# Patient Record
Sex: Female | Born: 1935 | Race: White | Hispanic: No | Smoking: Never smoker
Health system: Southern US, Community
[De-identification: ages and names within clinical notes are randomized; demographics above are authoritative.]

## PROBLEM LIST (undated history)

## (undated) DIAGNOSIS — H9190 Unspecified hearing loss, unspecified ear: Secondary | ICD-10-CM

## (undated) DIAGNOSIS — M1990 Unspecified osteoarthritis, unspecified site: Secondary | ICD-10-CM

## (undated) DIAGNOSIS — K219 Gastro-esophageal reflux disease without esophagitis: Secondary | ICD-10-CM

## (undated) HISTORY — PX: NO PAST SURGERIES: SHX2092

## (undated) HISTORY — PX: CATARACT EXTRACTION, BILATERAL: SHX1313

---

## 2011-07-22 ENCOUNTER — Inpatient Hospital Stay: Payer: Self-pay | Admitting: Student

## 2011-07-22 ENCOUNTER — Ambulatory Visit: Payer: Self-pay | Admitting: Internal Medicine

## 2011-07-22 LAB — URINALYSIS, COMPLETE
Bilirubin,UR: NEGATIVE
Ketone: NEGATIVE
Ketone: NEGATIVE
Nitrite: POSITIVE
Ph: 7.5 (ref 4.5–8.0)
Ph: 6 (ref 4.5–8.0)
Protein: 100
Protein: 100
RBC,UR: 11 /HPF (ref 0–5)
Specific Gravity: 1.01 (ref 1.003–1.030)
Squamous Epithelial: <1
WBC UR: 42 /HPF (ref 0–5)

## 2011-07-22 LAB — CBC
HCT: 36.6 % (ref 35.0–47.0)
MCH: 29.6 pg (ref 26.0–34.0)
MCHC: 32.8 g/dL (ref 32.0–36.0)
MCV: 90 fL (ref 80–100)
RDW: 13.1 % (ref 11.5–14.5)

## 2011-07-22 LAB — COMPREHENSIVE METABOLIC PANEL
Albumin: 3.3 g/dL — ABNORMAL LOW (ref 3.4–5.0)
Anion Gap: 9 (ref 7–16)
BUN: 13 mg/dL (ref 7–18)
Co2: 24 mmol/L (ref 21–32)
Creatinine: 1.09 mg/dL (ref 0.60–1.30)
EGFR (African American): 56 — ABNORMAL LOW
EGFR (Non-African Amer.): 49 — ABNORMAL LOW
Glucose: 168 mg/dL — ABNORMAL HIGH (ref 65–99)
SGPT (ALT): 17 U/L
Sodium: 132 mmol/L — ABNORMAL LOW (ref 136–145)

## 2011-07-22 LAB — TROPONIN I: Troponin-I: 1 ng/mL — ABNORMAL HIGH

## 2011-07-23 LAB — CK TOTAL AND CKMB (NOT AT ARMC)
CK, Total: 917 U/L — ABNORMAL HIGH (ref 21–215)
CK, Total: 861 U/L — ABNORMAL HIGH (ref 21–215)
CK-MB: 6 ng/mL — ABNORMAL HIGH (ref 0.5–3.6)
CK-MB: 6.1 ng/mL — ABNORMAL HIGH (ref 0.5–3.6)

## 2011-07-23 LAB — CBC WITH DIFFERENTIAL/PLATELET
Basophil #: 0 10*3/uL (ref 0.0–0.1)
Eosinophil #: 0 10*3/uL (ref 0.0–0.7)
Eosinophil %: 0.1 %
HCT: 36 % (ref 35.0–47.0)
Lymphocyte #: 0.7 10*3/uL — ABNORMAL LOW (ref 1.0–3.6)
MCH: 29.6 pg (ref 26.0–34.0)
MCHC: 32.8 g/dL (ref 32.0–36.0)
MCV: 90 fL (ref 80–100)
Monocyte #: 1.1 x10 3/mm — ABNORMAL HIGH (ref 0.2–0.9)
Monocyte %: 8.2 %
Neutrophil #: 11.4 10*3/uL — ABNORMAL HIGH (ref 1.4–6.5)
Neutrophil %: 86.7 %
RBC: 3.99 10*6/uL (ref 3.80–5.20)
RDW: 13.1 % (ref 11.5–14.5)

## 2011-07-23 LAB — PROTIME-INR
INR: 1.2
Prothrombin Time: 15.9 secs — ABNORMAL HIGH (ref 11.5–14.7)

## 2011-07-23 LAB — LIPID PANEL
HDL Cholesterol: 40 mg/dL (ref 40–60)
Triglycerides: 56 mg/dL (ref 0–200)
VLDL Cholesterol, Calc: 11 mg/dL (ref 5–40)

## 2011-07-23 LAB — BASIC METABOLIC PANEL
BUN: 12 mg/dL (ref 7–18)
Calcium, Total: 8.2 mg/dL — ABNORMAL LOW (ref 8.5–10.1)
Co2: 25 mmol/L (ref 21–32)
Creatinine: 1.03 mg/dL (ref 0.60–1.30)
EGFR (African American): >60
EGFR (Non-African Amer.): 52 — ABNORMAL LOW
Osmolality: 265 (ref 275–301)
Potassium: 3.5 mmol/L (ref 3.5–5.1)
Sodium: 132 mmol/L — ABNORMAL LOW (ref 136–145)

## 2011-07-23 LAB — URINE CULTURE

## 2011-07-23 LAB — TROPONIN I: Troponin-I: 1.25 ng/mL — ABNORMAL HIGH

## 2011-07-24 LAB — CBC WITH DIFFERENTIAL/PLATELET
Basophil #: 0 10*3/uL (ref 0.0–0.1)
Basophil %: 0.1 %
Eosinophil #: 0 10*3/uL (ref 0.0–0.7)
Eosinophil %: 0.2 %
HGB: 12 g/dL (ref 12.0–16.0)
MCH: 30.2 pg (ref 26.0–34.0)
MCHC: 33.2 g/dL (ref 32.0–36.0)
MCV: 91 fL (ref 80–100)
Monocyte #: 1.1 x10 3/mm — ABNORMAL HIGH (ref 0.2–0.9)
Monocyte %: 8.3 %
Neutrophil #: 11.3 10*3/uL — ABNORMAL HIGH (ref 1.4–6.5)
Neutrophil %: 82.7 %
Platelet: 156 10*3/uL (ref 150–440)
RDW: 13.5 % (ref 11.5–14.5)

## 2011-07-24 LAB — URINE CULTURE

## 2011-07-25 LAB — CBC WITH DIFFERENTIAL/PLATELET
Basophil %: 0.3 %
Eosinophil #: 0.2 10*3/uL (ref 0.0–0.7)
HGB: 11.3 g/dL — ABNORMAL LOW (ref 12.0–16.0)
Lymphocyte %: 21.1 %
MCH: 30.1 pg (ref 26.0–34.0)
MCHC: 33 g/dL (ref 32.0–36.0)
Monocyte %: 13 %
Neutrophil %: 63.6 %
Platelet: 148 10*3/uL — ABNORMAL LOW (ref 150–440)
RBC: 3.76 10*6/uL — ABNORMAL LOW (ref 3.80–5.20)
RDW: 13.4 % (ref 11.5–14.5)
WBC: 8.4 10*3/uL (ref 3.6–11.0)

## 2011-07-27 LAB — CULTURE, BLOOD (SINGLE)

## 2011-07-29 LAB — CULTURE, BLOOD (SINGLE)

## 2013-04-12 ENCOUNTER — Ambulatory Visit: Payer: Self-pay | Admitting: Family Medicine

## 2013-04-12 LAB — URINALYSIS, COMPLETE
Bilirubin,UR: NEGATIVE
Glucose,UR: NEGATIVE mg/dL (ref 0–75)
Ketone: NEGATIVE
Nitrite: NEGATIVE
Ph: 7.5 (ref 4.5–8.0)
RBC,UR: >30 /HPF (ref 0–5)
Specific Gravity: 1.01 (ref 1.003–1.030)

## 2013-04-12 LAB — CBC WITH DIFFERENTIAL/PLATELET
BASOS ABS: 0.1 10*3/uL (ref 0.0–0.1)
BASOS PCT: 0.4 %
EOS PCT: 0 %
Eosinophil #: 0 10*3/uL (ref 0.0–0.7)
HCT: 41.2 % (ref 35.0–47.0)
HGB: 13.5 g/dL (ref 12.0–16.0)
LYMPHS ABS: 0.9 10*3/uL — AB (ref 1.0–3.6)
Lymphocyte %: 6 %
MCH: 29.3 pg (ref 26.0–34.0)
MCHC: 32.7 g/dL (ref 32.0–36.0)
MCV: 90 fL (ref 80–100)
MONO ABS: 1.7 x10 3/mm — AB (ref 0.2–0.9)
Monocyte %: 11.4 %
Neutrophil #: 12.3 10*3/uL — ABNORMAL HIGH (ref 1.4–6.5)
Neutrophil %: 82.2 %
Platelet: 214 10*3/uL (ref 150–440)
RBC: 4.59 10*6/uL (ref 3.80–5.20)
RDW: 13.2 % (ref 11.5–14.5)
WBC: 15 10*3/uL — ABNORMAL HIGH (ref 3.6–11.0)

## 2013-04-12 LAB — BASIC METABOLIC PANEL
Anion Gap: 9 (ref 7–16)
BUN: 10 mg/dL (ref 7–18)
CALCIUM: 8.5 mg/dL (ref 8.5–10.1)
CHLORIDE: 97 mmol/L — AB (ref 98–107)
Co2: 27 mmol/L (ref 21–32)
Creatinine: 0.86 mg/dL (ref 0.60–1.30)
Glucose: 137 mg/dL — ABNORMAL HIGH (ref 65–99)
Osmolality: 268 (ref 275–301)
Potassium: 3.9 mmol/L (ref 3.5–5.1)
SODIUM: 133 mmol/L — AB (ref 136–145)

## 2013-04-12 LAB — RAPID STREP-A WITH REFLX: MICRO TEXT REPORT: NEGATIVE

## 2013-04-12 LAB — RAPID INFLUENZA A&B ANTIGENS

## 2013-04-14 LAB — URINE CULTURE

## 2013-04-15 LAB — BETA STREP CULTURE(ARMC)

## 2013-08-10 IMAGING — CT CT HEAD WITHOUT CONTRAST
2 series · 16 of 30 positions shown, 20 images · non-contrast
Comparison: none

RESULT:      No hemorrhage. Prominent sulci noted consistent with atrophy.
No mass. No acute bony abnormality.

[Series 2: without · axial · non-contrast · 0.41mm/px · z∈[-132,-12]mm · 13 of 30 slices shown, 17 images]
[im 3/30  brain]
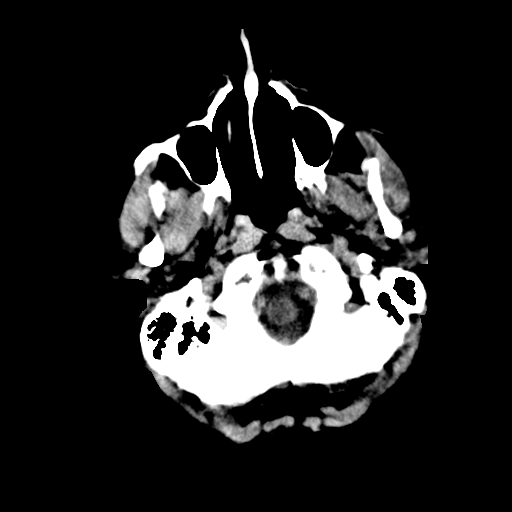
[im 3/30  bone]
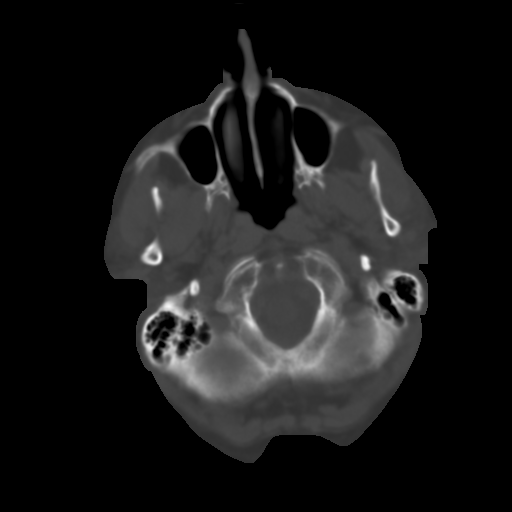
[im 5/30  brain]
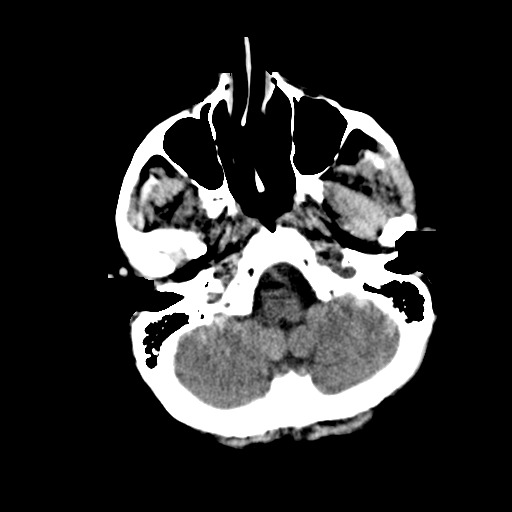
[im 7/30  brain]
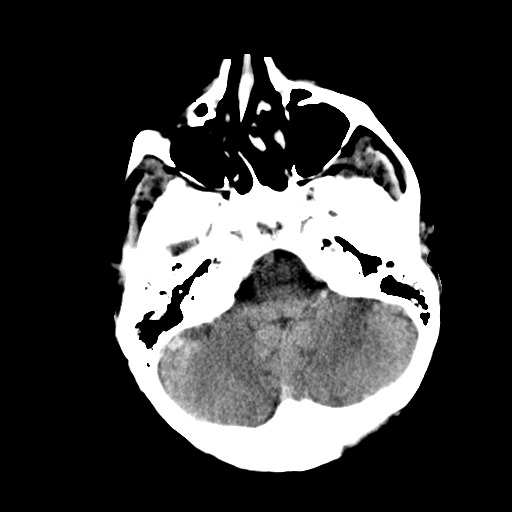
[im 9/30  brain]
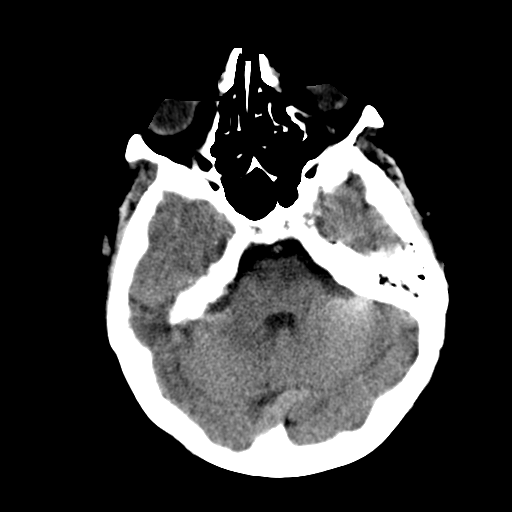
[im 11/30  brain]
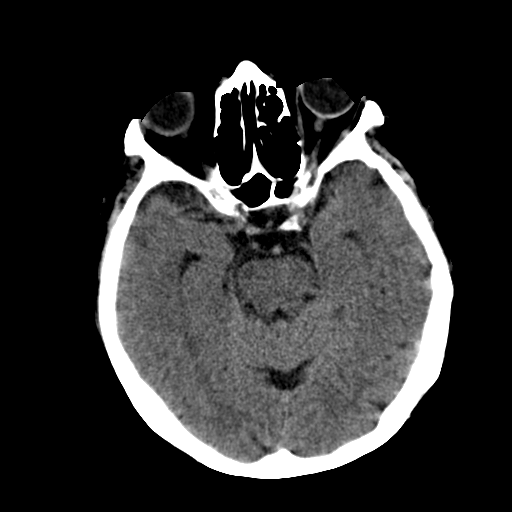
[im 11/30  bone]
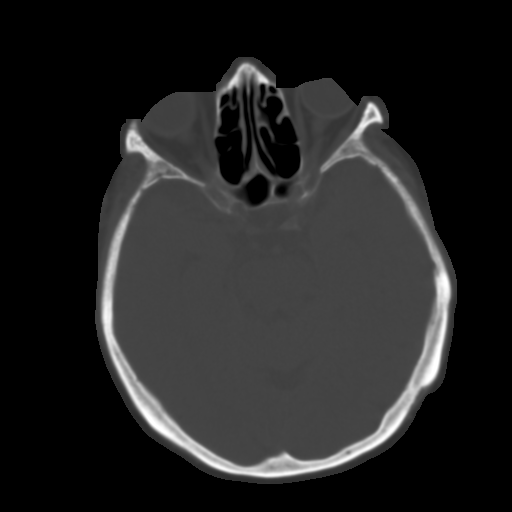
[im 13/30  brain]
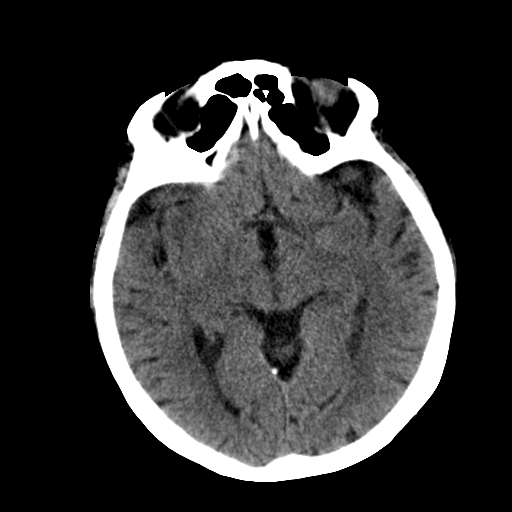
[im 15/30  brain]
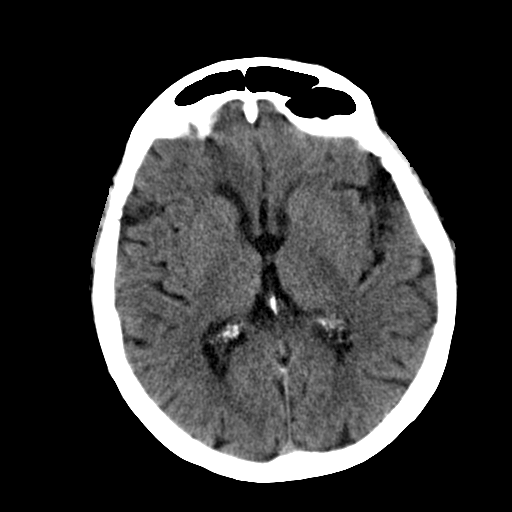
[im 17/30  brain]
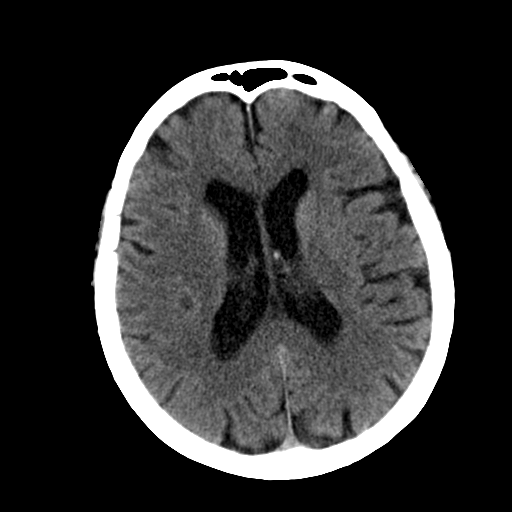
[im 19/30  brain]
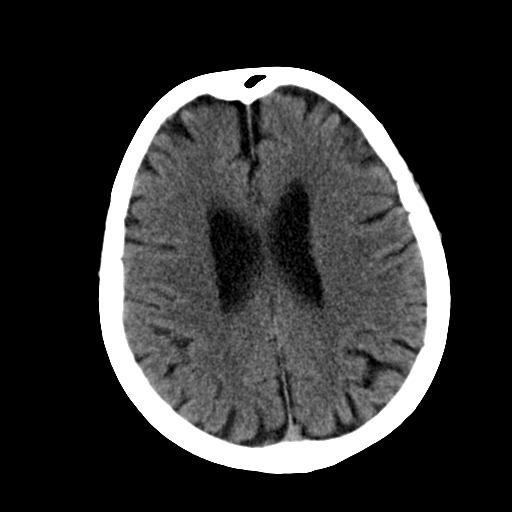
[im 19/30  bone]
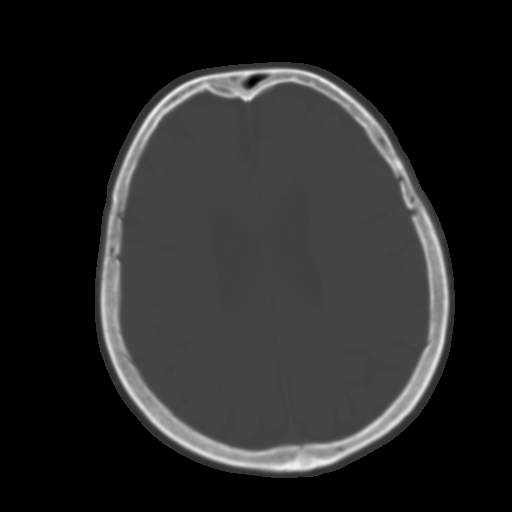
[im 21/30  brain]
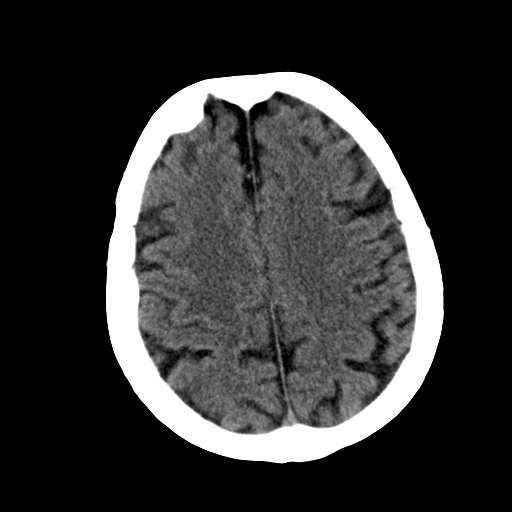
[im 23/30  brain]
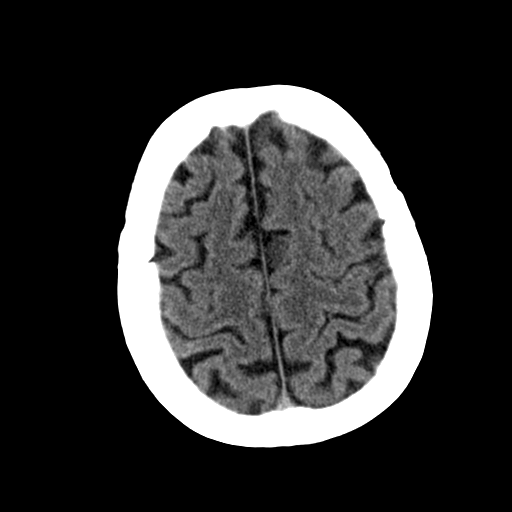
[im 25/30  brain]
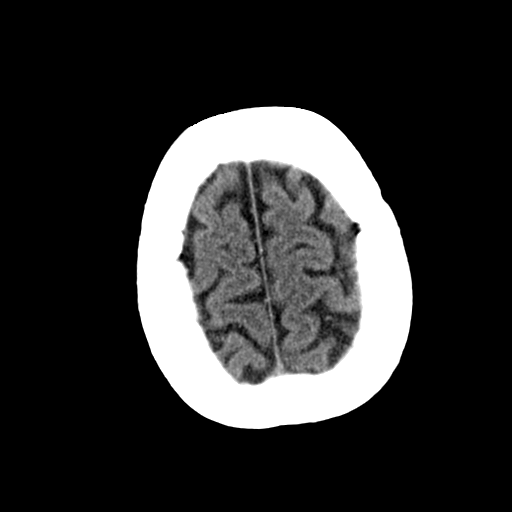
[im 27/30  brain]
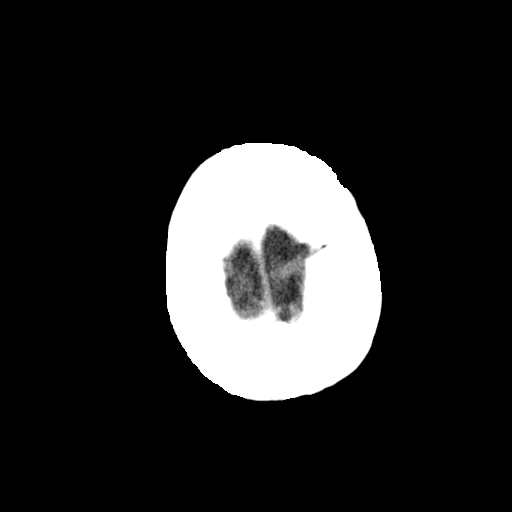
[im 27/30  bone]
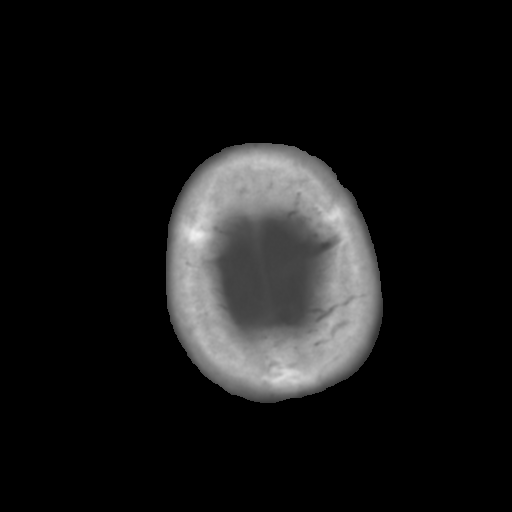

[Series 3: bone · axial · 0.41mm/px · z∈[-132,-92]mm · 3 of 30 slices shown]
[im 3/30  bone]
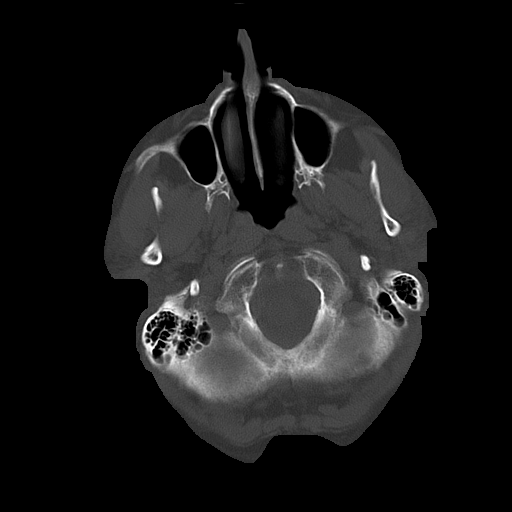
[im 7/30  bone]
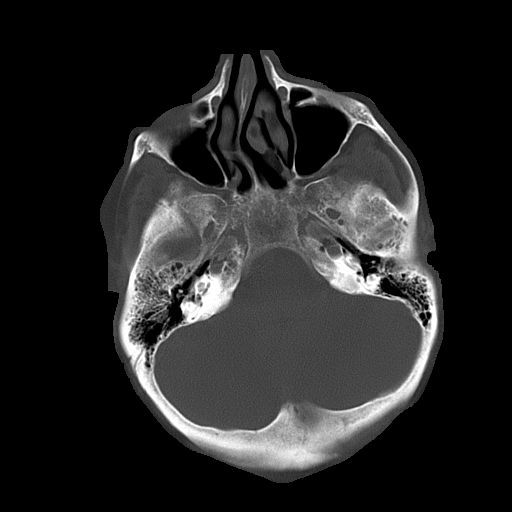
[im 11/30  bone]
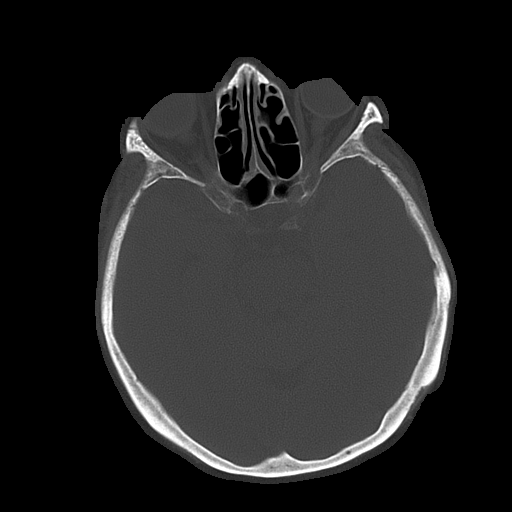

[16 of 30 positions shown; findings below may reference images not displayed]

IMPRESSION: No acute abnormality.

## 2013-08-10 IMAGING — CR DG CHEST 2V
1 series · 2 of 2 positions shown · non-contrast
Comparison: none

REASON FOR EXAM: fever
COMMENTS:

PROCEDURE:     DXR - DXR CHEST PA (OR AP) AND LATERAL  - July 22, 2011  [DATE]
RESULT:     The lungs are clear. The heart and pulmonary vessels are normal.
The bony and mediastinal structures are unremarkable. There is no effusion.
There is no pneumothorax or evidence of congestive failure.

[Series 1: w chest pa · 0.14mm/px · 2 of 2 slices shown]
[im 1/2]
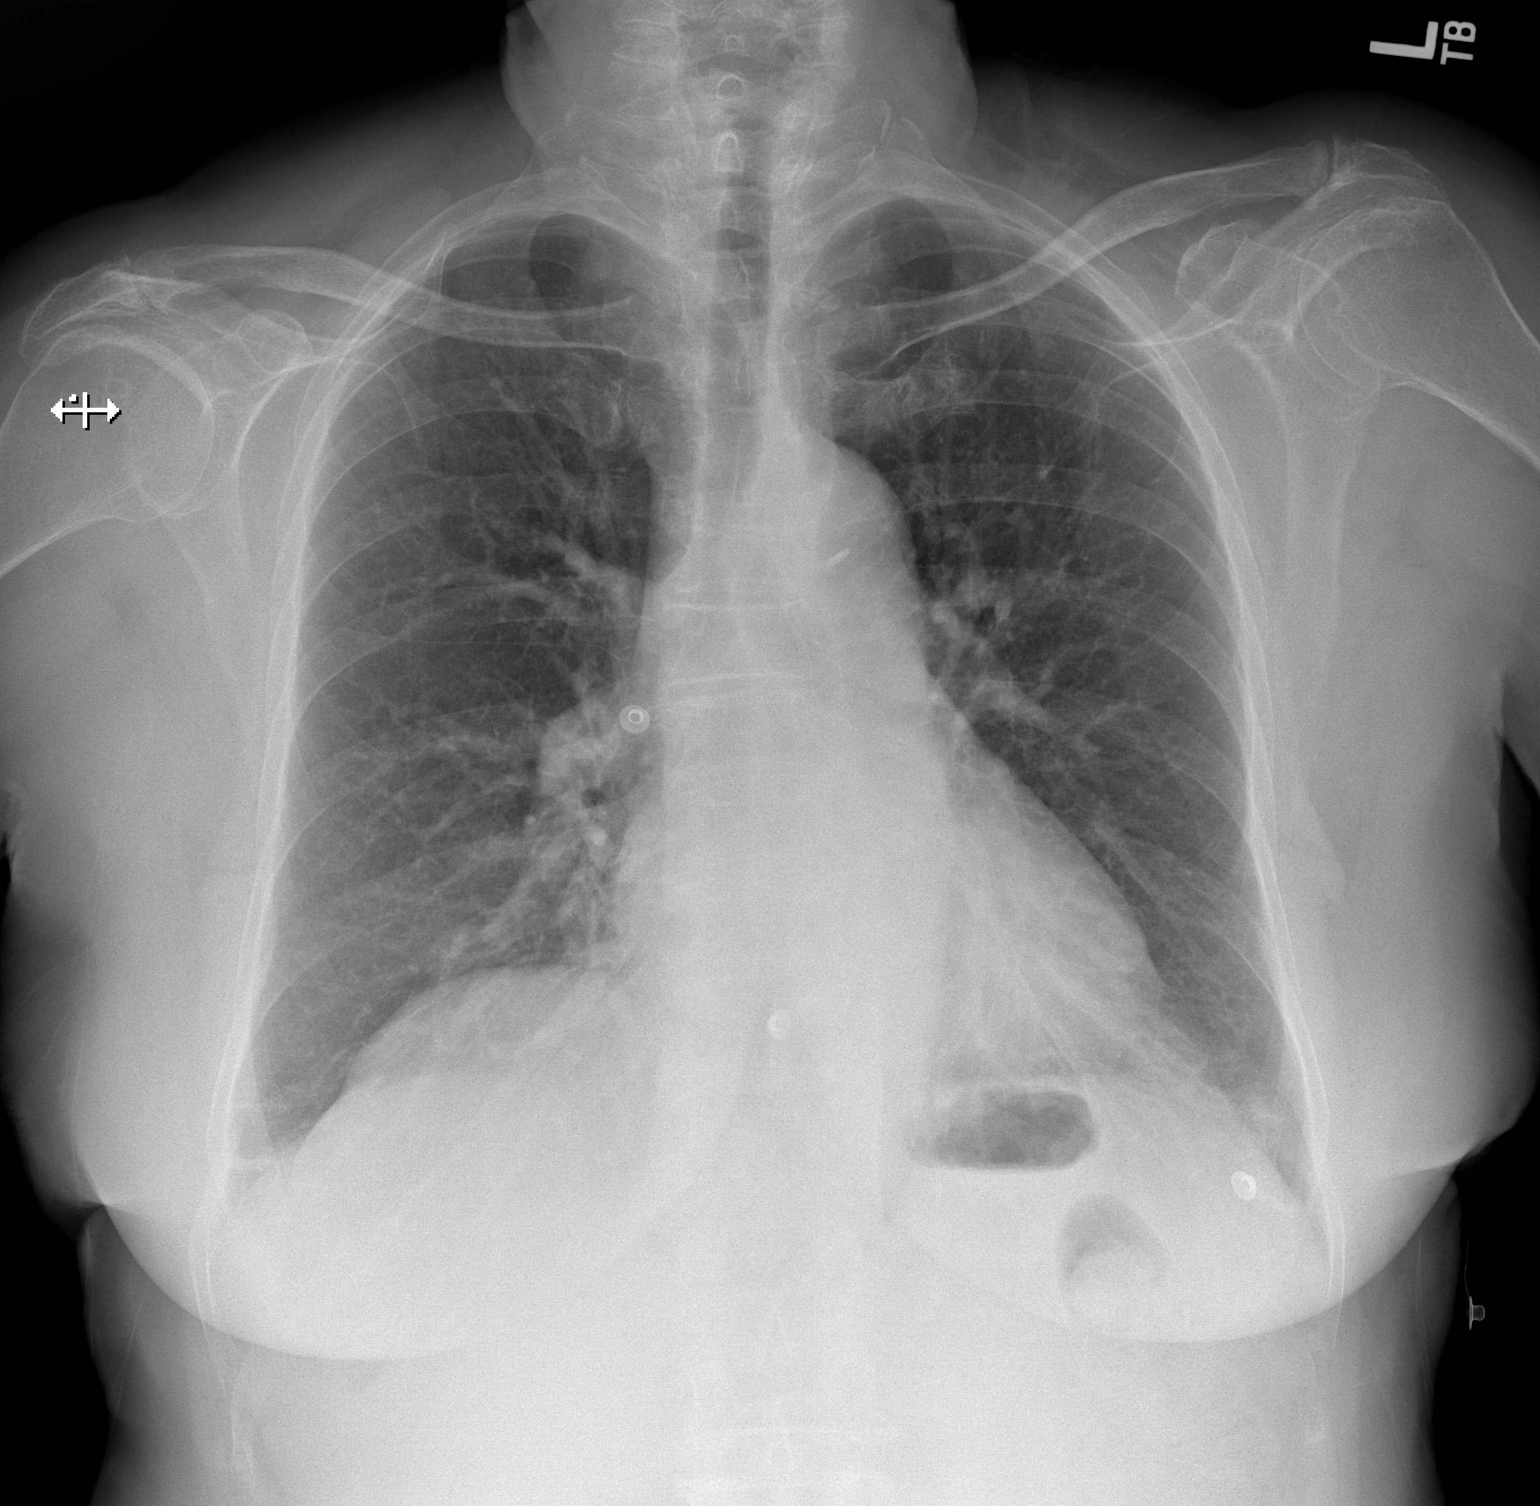
[im 2/2]
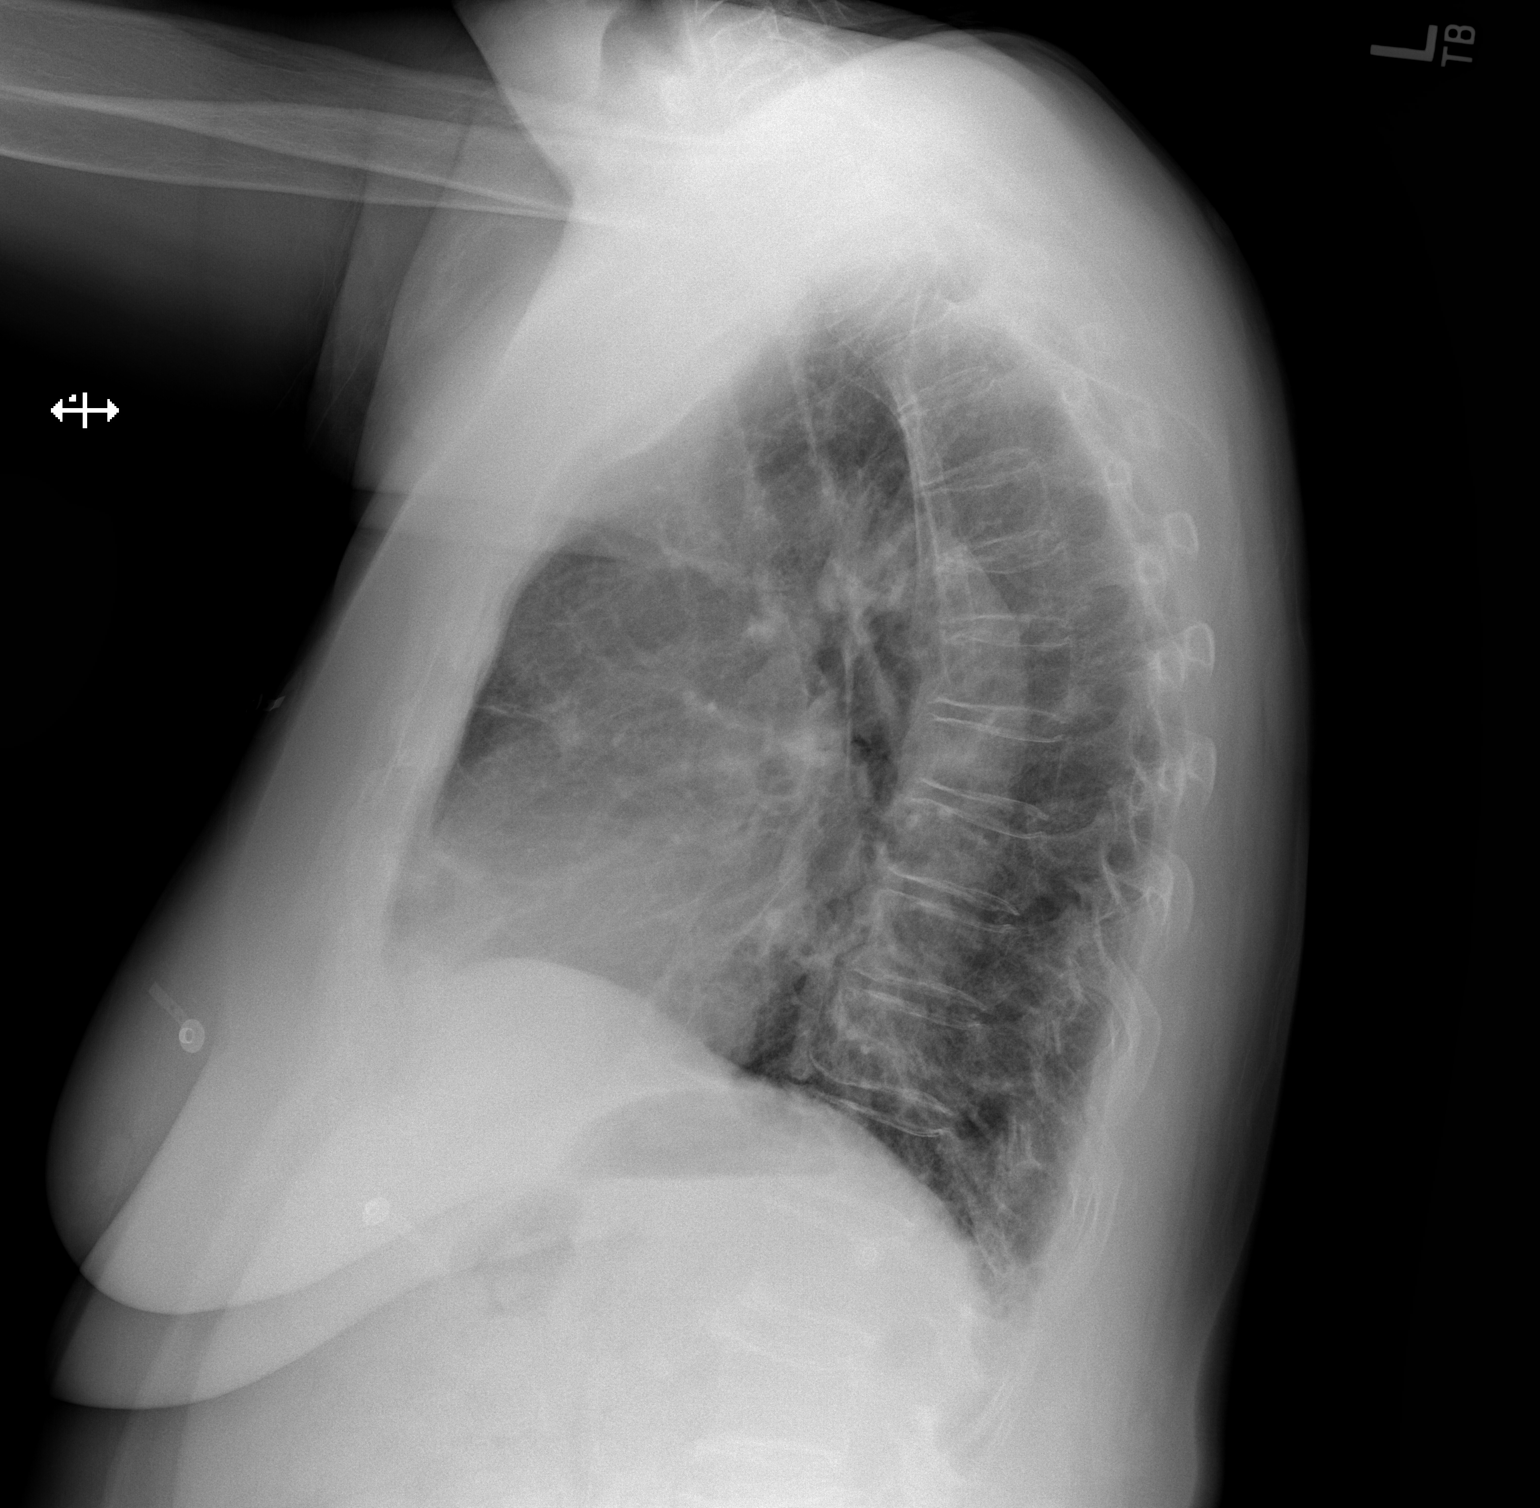

[2 of 2 positions shown; findings below may reference images not displayed]

IMPRESSION: No acute cardiopulmonary disease.

[REDACTED]

## 2013-11-13 ENCOUNTER — Ambulatory Visit: Payer: Self-pay

## 2014-05-21 NOTE — H&P (Signed)
PATIENT NAME:  Becky Shea, Becky Shea MR#:  [id number] DATE OF BIRTH:  Aug 19, 1931  DATE OF ADMISSION:  07/22/2011  PRIMARY CARE PHYSICIAN: None.  ER PHYSICIAN: Dr. Cinda Quest ADMITTING PHYSICIAN: Dr. Pearletha Furl  PRESENTING COMPLAINT: Fever and loss of consciousness.   HISTORY: Patient is a 81 year old lady who was in her usual state of health until last week when she began having recurrent lower abdominal pain, dysuria, frequency. Was seen by physician at urgent care center this morning and diagnosed with urinary tract infection and started on Bactrim. Went home, took the Bactrim and soon afterwards developed some rigors, chills. Later this evening patient passed out. Does not recall the event preceding the passing out but denies any prior palpitations, chest pain, PND, orthopnea or pedal edema. No episodes of vomiting prior to the passing out. She was found on the floor by her granddaughter who activated EMS and she was brought to the Emergency Room. In route to the ER patient vomited twice, was nonbloody. Denies any abdominal pain. No history of headache, rashes, trauma, ENT bleed. For this she was seen in the Emergency Room and referred to the hospitalist. Work-up so far showed elevated temperature of 100.9, was 101.5 at home prior to this episode. Heart rate was 113, blood pressure 121/69. CBC showed elevated white count of 18, sodium 132, creatinine 1, glucose 158. Troponin is elevated at 1. Urinalysis was positive for urinary tract infection.   REVIEW OF SYSTEMS: CONSTITUTIONAL: Positive for fever, fatigue, weakness. No weight loss or weight gain. EYES: No blurred vision, redness or discharge. ENT: No discharge, redness, difficulty swallowing or pain in the ears. RESPIRATORY: Denies any cough, shortness of breath. CARDIOVASCULAR: Denies any chest pain, orthopnea, pedal edema. No arrhythmia, exertional dyspnea but positive for syncopal episode today which was not witnessed. GASTROINTESTINAL: Has some nausea and  vomiting today. Vomited twice earlier in the day. Denies any diarrhea. No abdominal pain. No change in bowel habits. GENITOURINARY: Positive for dysuria, frequency but no hematuria. ENDOCRINE: No polyuria, polydipsia, heat or cold intolerance. HEMATOLOGIC: No anemia, easy bruising, bleeding, or swollen glands. SKIN: No rashes, change in hair or skin texture. MUSCULOSKELETAL: Positive for generalized joint aches. No redness or limited activity of joints. No swelling. NEUROLOGIC: No numbness, weakness, vertigo, CVA, seizure but has some memory impairment about events preceding the syncopal episode. PSYCH: No anxiety, depression, or nervousness.   PAST MEDICAL HISTORY: Nil not note.  PAST SURGICAL HISTORY: Nil of note. Had prior history of lipoma excision on her back more than 30 years ago.   SOCIAL HISTORY: Lives at home with family. Occasional social alcohol use. Denies any tobacco or recreational drug use. Used to work as a Museum/gallery conservator.   FAMILY HISTORY: Denies any family history of coronary artery disease, hypertension, diabetes, or CVA.   ALLERGIES: No known drug allergies.   MEDICATIONS:  1. Multivitamin 1 tablet daily.  2. Vitamin B12 1 tablet daily.  3. Bactrim DS which was started today which she was only taking 1 tablet.   PHYSICAL EXAMINATION:  VITAL SIGNS: Temperature 100.9, pulse 113 on arrival, now is 98, respiratory rate 20, blood pressure 121/69 on arrival, now is 97/58, oxygen saturation 89 on room air, now 92 on 2 liters nasal cannula.   GENERAL: Elderly obese lady lying on the gurney, awake, alert, oriented to time, place, and person, in no overt distress. Family at bedside, supportive.   HEENT: Atraumatic, normocephalic. Pupils equal, reactive to light and accommodation. Extraocular movements intact. Mucous membranes pink, dry.  NECK: Supple. No JV distention.   CHEST: Good air entry. Clear to auscultation.   HEART: Regular rate, rhythm. No murmurs.   ABDOMEN:  Full, moves with respiration, nontender. Bowel sounds normoactive. No organomegaly.   EXTREMITIES: No edema, clubbing, deformity.   NEUROLOGICAL: No focal motor or sensory deficits.   PSYCH: Affect appropriate to situation.   SKIN: Lipoma on the midportion of the back, which is hard,  subcutaneous tissue.   LABORATORY, DIAGNOSTIC, AND RADIOLOGICAL DATA: EKG shows sinus rhythm, rate of 93 with multiple PVCs. No acute ST wave changes. CT head showed no acute intracranial abnormality. CBC: White count 18. No previous labs for comparison. Hemoglobin 12, platelets 197. Chemistry: Sodium 132, potassium 3.7, bicarbonate 24, creatinine 1, BUN 13, glucose 158, calcium 8.2. Normal LFT. Troponin is elevated at 1. Urinalysis showed positive nitrites, positive leukocyte esterase 3+, red blood cells 15 to 30, white cells too numerous to count, bacteria 4+.   IMPRESSION:  1. Sepsis in view of elevated temperature, heart rate, respiratory rate, and signs of infection, likely secondary to the urinary tract infection. 2. Urinary tract infection. 3. Syncopal episodes, not otherwise specified with differential including arrhythmia versus orthostatic,likely vasovagal from vomiting, rule out cardiogenic.  4. Elevated troponin likely from the sepsis syndrome.  5. Hyponatremia, not otherwise specified.  6. Hyperglycemia, not otherwise specified.   PLAN:  1. Admit to general medical floor for serial cardiac enzymes. Check TSH, magnesium, fasting lipid profile, hemoglobin A1c. For telemonitoring. Echocardiogram in a.m. IV antibiotics with Rocephin. IV fluid for rehydration. Check blood culture, urine culture. Put patient on low-dose aspirin. Lovenox for anticoagulation, p.r.n. nitroglycerin and morphine.  2. GI prophylaxis with Protonix.  3. Deep vein thrombosis with Lovenox.  4. CODE STATUS: FULL CODE.       TOTAL PATIENT CARE TIME: 50 minutes.   ____________________________ Jules Husbands Pearletha Furl,  MD mia:cms D: 07/22/2011 18:57:57 ET T: 07/23/2011 06:33:23 ET JOB#: [id number]  cc: Zain Bingman I. Pearletha Furl, MD, <Dictator>  Carola Frost MD ELECTRONICALLY SIGNED 07/24/2011 3:32

## 2014-05-21 NOTE — Consult Note (Signed)
General Aspect 81 year old female with sepsis from UTI, syncope with positive troponin.  Her initial troponin was 0.96, followed by 1.25 now trending down at 1.0.  Patient is very healthy.  She has no known cardiac history or other illnesses.  She is on no medications.  She developed symptoms of UTI and took one dose of Septra.  She started feeling nauseated and lightheaded.  The next thing she knew she would was in the floor.  She could sit up, but could not get up due to weakness.  It isn't known how long she was in the floor.  Her husband was at  home but was unaware she was having problems in the bedroom.  The granddaughter came in around lunch time was able to get her off the floor and called 911.  Initial  EKG did not show any acute changes.  Telemetry has not shown any arrhythmias.  Patient has history of syncope when she was around 16 or 17.  This is the first syncopal episode of her adult life  and is felt secondary to her acute illness.  She is feeling much better today.  She is anxious to go home.  She denies any recent chest pain or shortness of breath, pedal edema, palpitations, PND, orthopnea. Echocardiogram showed normal LV function with some mild valvular insufficiency.   Physical Exam:   GEN well developed, no acute distress    HEENT pink conjunctivae    RESP normal resp effort  clear BS    CARD Regular rate and rhythm  Normal, S1, S2  No murmur    ABD normal BS    EXTR negative edema    SKIN normal to palpation    NEURO cranial nerves intact, follows commands, motor/sensory function intact    PSYCH alert, A+O to time, place, person, good insight   Review of Systems:   Subjective/Chief Complaint Fever, weakness, syncope    General: Fever/chills  Weakness  resolving    Cardiovascular: No Complaints    Genitourinary: Frequent urination  Burning or painful urination    Neurologic: syncope    Review of Systems: All other systems were reviewed and found to be negative     Medications/Allergies Reviewed Medications/Allergies reviewed   Lab Results: Routine Chem:  25-Jun-13 16:04    Result Comment TROPONIN - RESULTS VERIFIED BY REPEAT TESTING.  - C/TYLER WILLIAMS @ 1744 ON 07/22/2011 [facility]  - READ-BACK PROCESS PERFORMED.  Result(s) reported on 22 Jul 2011 at 05:45PM.  26-Jun-13 00:54    Result Comment troponin - RESULTS VERIFIED BY REPEAT TESTING.  - prev c/ @ 1744 07/22/11 mpg  Result(s) reported on 23 Jul 2011 at 01:56AM.    04:21    Glucose, Serum  114   BUN 12   Creatinine (comp) 1.03   Potassium, Serum 3.5   Chloride, Serum 98   CO2, Serum 25   Calcium (Total), Serum  8.2   Anion Gap 9   Osmolality (calc) 265   eGFR (African American) >60   eGFR (Non-African American)  52 (eGFR values <8m/min/1.73 m2 may be an indication of chronic kidney disease (CKD). Calculated eGFR is useful in patients with stable renal function. The eGFR calculation will not be reliable in acutely ill patients when serum creatinine is changing rapidly. It is not useful in  patients on dialysis. The eGFR calculation may not be applicable to patients at the low and high extremes of body sizes, pregnant women, and vegetarians.)   Magnesium, Serum  1.7 (1.8-2.4 THERAPEUTIC RANGE: 4-7 mg/dL TOXIC: > 10 mg/dL  -----------------------)   Hemoglobin A1c ([facility]) 5.7 (The American Diabetes Association recommends that a primary goal of therapy should be <7% and that physicians should reevaluate the treatment regimen in patients with HbA1c values consistently >8%.)   Cholesterol, Serum 140   Triglycerides, Serum 56   HDL (INHOUSE) 40   VLDL Cholesterol Calculated 11   LDL Cholesterol Calculated 89 (Result(s) reported on 23 Jul 2011 at 06:19AM.)    07:54    Result Comment TROPONIN - RESULTS VERIFIED BY REPEAT TESTING.  - RESULT PREVIOUSLY CALLED 07/22/11  - AT 1744 BY [facility]  Result(s) reported on 23 Jul 2011 at 09:24AM.  Routine Coag:  26-Jun-13 04:21    Prothrombin   15.9   INR 1.2 (INR reference interval applies to patients on anticoagulant therapy. A single INR therapeutic range for coumarins is not optimal for all indications; however, the suggested range for most indications is 2.0 - 3.0. Exceptions to the INR Reference Range may include: Prosthetic heart valves, acute myocardial infarction, prevention of myocardial infarction, and combinations of aspirin and anticoagulant. The need for a higher or lower target INR must be assessed individually. Reference: The Pharmacology and Management of the Vitamin K  antagonists: the seventh ACCP Conference on Antithrombotic and Thrombolytic Therapy. IHKVQ.2595 Sept:126 (3suppl): [id number]. A HCT value >55% may artifactually increase the PT.  In one study,  the increase was an average of 25%. Reference:  "Effect on Routine and Special Coagulation Testing Values of Citrate Anticoagulant Adjustment in Patients with High HCT Values." American Journal of Clinical Pathology 2006;126:400-405.)  Routine Hem:  26-Jun-13 04:21    WBC (CBC)  13.2   RBC (CBC) 3.99   Hemoglobin (CBC)  11.8   Hematocrit (CBC) 36.0   Platelet Count (CBC) 173   MCV 90   MCH 29.6   MCHC 32.8   RDW 13.1   Neutrophil % 86.7   Lymphocyte % 4.9   Monocyte % 8.2   Eosinophil % 0.1   Basophil % 0.1   Neutrophil #  11.4   Lymphocyte #  0.7   Monocyte #  1.1   Eosinophil # 0.0   Basophil # 0.0 (Result(s) reported on 23 Jul 2011 at [facility].)   Radiology Results: XRay:    26-Jun-13 10:38, Chest PA and Lateral   Chest PA and Lateral    REASON FOR EXAM:    POSSIBLE ASPIRATION  COMMENTS:       PROCEDURE: DXR - DXR CHEST PA (OR AP) AND LATERAL  - Jul 23 2011 10:38AM     RESULT: Comparison is made to the study of 22 July 2011.    The lungs are hyperinflated consistent with COPD. The cardiac silhouette   is enlarged. Cardiac monitoring electrodes are present. The interstitial   markings are prominent which could be secondary to  chronic inflammation   and fibrosis or possibly edema. Pneumonitis is a consideration but this   appears to be unchanged.    IMPRESSION:   1. Hyperinflation consistent with COPD.  2. Mild cardiomegaly.  3. Interstitial prominence is likely secondary to chronic inflammation   and fibrosis. Edema is felt to be less likely.    Dictation Site: 2          Verified By: Sundra Aland, M.D., MD  Cardiology:    26-Jun-13 08:54, Echo Doppler   Echo Doppler    Interpretation Summary    Left ventricular systolic function is normal.ef  55% The left atrium   is mildly dilated. The right atrium is mildly dilated. There is mild   to moderate mitral regurgitation. There is mild to moderate   tricuspid regurgitation.    Procedure:    A two-dimensional transthoracic echocardiogram with color flow and   Doppler was performed.    Left Ventricle    The left ventricle is normal in size.    Left ventricular systolic function is normal.    Right Ventricle    The right ventricle is normal in size and function.    Atria    The left atrium is mildly dilated.    The right atrium is mildly dilated.    Mitral Valve    The mitral valve leaflets appear thickened, but open well.    There is mild to moderate mitral regurgitation.    Tricuspid Valve    The tricuspid valve is normal in structure and function.    There is mild to moderate tricuspid regurgitation.    Aortic Valve    The aortic valve is normal in structure and function.    No aortic regurgitation is present.    Pulmonic Valve    The pulmonic valve is not well seen, but is grossly normal.    Great Vessels    The aortic root is normal size.    Pericardium/Pleural    No pericardial effusion.    MMode 2D Measurements and Calculations    RVDd: 3.7 cm    IVSd: 1.2 cm    LVIDd: 4.0 cm    LVIDs: 2.9 cm    LVPWd: 1.2 cm    FS: 28 %    EF(Teich): 55 %    Ao root diam: 3.1 cm    LA dimension: 3.0 cm    LVOT diam: 2.1  cm    Doppler Measurements and Calculations    MV E point: 72 cm/sec    MV A point: 99 cm/sec    MV E/A: 0.73     MV dec time: 0.26 sec    Ao V2 max: 153 cm/sec    Ao max PG: 9.0 mmHg    AVA(V,D): 2.3 cm2    LV max PG: 4.0 mmHg    LV V1 max: 101 cm/sec    PA V2 max: 108 cm/sec    PA max PG: 5.0 mmHg    TR Max vel: 254 cm/sec    TR Max PG: 58mHg    RVSP: 31 mmHg    RAP systole: 5.0 mmHg    Reading Physician: KSerafina Royals  Sonographer: HSherrie Sport Interpreting Physician:  BSerafina Royals  electronically signed on   07-23-2011 13:34:55  Requesting Physician: KSerafina Royals   No Known Allergies:     Impression 81year old female with no significant past medical concerns, coronary disease and no previous medications with syncope and collapse associated with UTI and sepsis, elevation of troponin, subendocardial infarction, though to be secondary to sepsis, hypotension, tachycardia and  subsequent stress on the myocardium but not an acute coronary syndrome.    Plan 1. Discussed the patient with Dr. KNehemiah Massedis in agreement with the above impression.  Do not recommend any change in current therapy and no further cardiac testing is indicated.   Electronic Signatures: CRoderic Palau(NP)  (Signed 26-Jun-13 17:28)  Authored: General Aspect/Present Illness, History and Physical Exam, Review of System, Labs, Radiology, Allergies, Impression/Plan   Last Updated: 26-Jun-13 17:28 by CRoderic Palau(NP)

## 2014-05-21 NOTE — Discharge Summary (Signed)
PATIENT NAME:  Becky Shea, GRAND MR#:  [id number] DATE OF BIRTH:  1931-02-15  DATE OF ADMISSION:  07/22/2011 DATE OF DISCHARGE:  07/25/2011  CONSULTANTS: Serafina Royals, MD - Cardiology.   CHIEF COMPLAINT: Fever and loss of consciousness.   DISCHARGE DIAGNOSES:  1. Sepsis with Escherichia coli bacteremia, likely from urine source. 2. Syncope with likely combination of sepsis/vasovagal and orthostatic hypotension.  3. Elevated troponin, likely from demand ischemia.  4. Mild hyponatremia.   DISCHARGE MEDICATIONS:  1. Multivitamin 1 tab daily.  2. Vitamin B12 1 tab daily.  3. Aspirin 81 mg daily.  4. Keflex 500 mg four times daily for 11 days.   DIET: Low sodium.   ACTIVITY: As tolerated.   DISCHARGE FOLLOWUP: Please follow up with her primary care physician within a week.   DISPOSITION: Home.   HISTORY OF PRESENT ILLNESS: For full details of the history and physical, please see the dictation on 07/22/2011 by Dr. Lodema Hong, but briefly this is a pleasant 81 year old Caucasian female with no significant past medical history who presented with abdominal pain, dysuria, and frequency and was seen by a physician at an urgent care and diagnosed with a urinary tract infection and took Bactrim for a day. The patient did have some rigors, chills, and loss of consciousness and presented to the ER. Here she did have significant fever and leukocytosis, sodium was also 132, and admitted to the hospitalist service for further evaluation and management. Initial urinalysis was suggestive of an infection and urine and blood cultures were sent.   SIGNIFICANT LABS AND IMAGING: Initial sodium 132, creatinine 1.09, and glucose 168. Troponin initially was 1 then 1.25 then 0.96. Initial hemoglobin was 12. Initial WBC 18.4, which trended down to 8.4 by discharge. Hemoglobin A1c was 5.7. LDL 89.   Initial urine culture showed mixed bacterial organisms.   Blood culture from the date of admission was  Escherichia coli susceptible to multiple agents.  Repeat urine cultures again showed mixed organisms, from 07/22/2011.   Initial urinalysis showed 3+ leukocyte esterase, too numerous to count WBCs, and 4+ bacteria.   Echocardiogram, on 07/23/2011, showed ejection fraction of 55%, left atrium mildly dilated, right atrium mildly dilated, and mild to moderate MR and TR.   CT of the brain without contrast: No acute abnormality.   Chest PA and lateral on admission showed no acute cardiopulmonary disease and then repeated on 07/23/2011 in the morning showed hyperinflation consistent with chronic obstructive pulmonary disease, mild cardiomegaly, and interstitial prominence likely due to chronic inflammation and fibrosis. Edema is felt to be less likely.   HOSPITAL COURSE:  1. Sepsis. The patient did have a fever, leukocytosis, and tachycardia with dirty urine on arrival. The patient was pancultured including urine and blood and although urine cultures did not grow anything blood cultures showed the patient had Escherichia coli bacteremia. The patient had been started on ceftriaxone on arrival and as the blood cultures did become positive with gram-negative rods, they were continued. Repeat blood cultures have been sent which are no growth to date. At this point, she has had three days of IV antibiotics and the leukocytosis has resolved as well as the fever curve has greatly improved. The patient has been afebrile for the last 24 hours and at this point antibiotics have been changed to the Keflex to be given for an additional 11 days total. The likely source of bacteremia is the UTI and although the urine cultures again were not positive the urinalysis was strongly suggestive  of infection and the patient was symptomatic on arrival.  2. Syncope. The patient did come with a syncopal episode and on the day of admission again had a repeat syncopal episode. The patient did develop rigors and perfuse vomiting  followed by loss of consciousness. This was thought to be secondary to vasovagal as well as orthostatic likely. The patient had significant orthostatic hypotension and was started on IV fluids. The patient had no further syncopal episodes. She did have elevated troponins as part of the work-up; however, that was thought to be demand ischemia and sepsis related, per cardiology, who evaluated the patient. The patient had a CT of the head which was negative and was neurologically intact. She has no further orthostatic hypotension or positional dizziness at this point. 3. Positive troponins. The patient did not have any chest pain, although she did have elevated troponins and this was felt to be demand ischemia, per cardiology. She will be discharged on an aspirin. Her LDL was within goal. Echo did not show any regional wall motion abnormalities. Per cardiology, no further work-up is intended at this point.  4. Hyperglycemia. Although she did have bouts of hyperglycemia, this was felt to be more from sepsis/stress-induced. Her Hemoglobin A1c was checked and was 5.7. At this point, she will be discharged with outpatient follow-up.  5. She did have mild hyponatremia, which is stable.   CODE STATUS: FULL CODE   TOTAL TIME SPENT: 35 minutes.  ____________________________ Vivien Presto, MD sa:slb D: 07/25/2011 16:12:21 ET T: 07/26/2011 10:49:05 ET JOB#: [id number]  cc: Vivien Presto, MD, <Dictator> Vivien Presto MD ELECTRONICALLY SIGNED 08/02/2011 13:09

## 2014-12-27 DIAGNOSIS — M170 Bilateral primary osteoarthritis of knee: Secondary | ICD-10-CM | POA: Insufficient documentation

## 2015-12-26 ENCOUNTER — Ambulatory Visit
Admission: EM | Admit: 2015-12-26 | Discharge: 2015-12-26 | Disposition: A | Discharge status: Home / Self Care | Payer: Medicare Other | Attending: Family Medicine | Admitting: Family Medicine

## 2015-12-26 ENCOUNTER — Ambulatory Visit (INDEPENDENT_AMBULATORY_CARE_PROVIDER_SITE_OTHER): Payer: Medicare Other

## 2015-12-26 DIAGNOSIS — Z0001 Encounter for general adult medical examination with abnormal findings: Secondary | ICD-10-CM | POA: Diagnosis not present

## 2015-12-26 DIAGNOSIS — R55 Syncope and collapse: Secondary | ICD-10-CM

## 2015-12-26 DIAGNOSIS — W19XXXA Unspecified fall, initial encounter: Secondary | ICD-10-CM

## 2015-12-26 DIAGNOSIS — R739 Hyperglycemia, unspecified: Secondary | ICD-10-CM

## 2015-12-26 DIAGNOSIS — H6501 Acute serous otitis media, right ear: Secondary | ICD-10-CM | POA: Diagnosis not present

## 2015-12-26 DIAGNOSIS — E871 Hypo-osmolality and hyponatremia: Secondary | ICD-10-CM

## 2015-12-26 DIAGNOSIS — S42412A Displaced simple supracondylar fracture without intercondylar fracture of left humerus, initial encounter for closed fracture: Secondary | ICD-10-CM | POA: Diagnosis not present

## 2015-12-26 DIAGNOSIS — Y92009 Unspecified place in unspecified non-institutional (private) residence as the place of occurrence of the external cause: Secondary | ICD-10-CM | POA: Diagnosis not present

## 2015-12-26 DIAGNOSIS — W07XXXA Fall from chair, initial encounter: Secondary | ICD-10-CM | POA: Insufficient documentation

## 2015-12-26 DIAGNOSIS — N3001 Acute cystitis with hematuria: Secondary | ICD-10-CM | POA: Diagnosis not present

## 2015-12-26 DIAGNOSIS — J028 Acute pharyngitis due to other specified organisms: Secondary | ICD-10-CM | POA: Diagnosis not present

## 2015-12-26 LAB — CBC WITH DIFFERENTIAL/PLATELET
Basophils Absolute: 0 10*3/uL (ref 0–0.1)
Basophils Relative: 0 %
Eosinophils Absolute: 0 10*3/uL (ref 0–0.7)
Eosinophils Relative: 0 %
HEMATOCRIT: 42.3 % (ref 35.0–47.0)
Hemoglobin: 13.9 g/dL (ref 12.0–16.0)
LYMPHS PCT: 3 %
Lymphs Abs: 0.6 10*3/uL — ABNORMAL LOW (ref 1.0–3.6)
MCH: 29.4 pg (ref 26.0–34.0)
MCHC: 33 g/dL (ref 32.0–36.0)
MCV: 89.3 fL (ref 80.0–100.0)
MONO ABS: 1.5 10*3/uL — AB (ref 0.2–0.9)
MONOS PCT: 8 %
NEUTROS ABS: 18.4 10*3/uL — AB (ref 1.4–6.5)
Neutrophils Relative %: 89 %
Platelets: 227 10*3/uL (ref 150–440)
RBC: 4.74 MIL/uL (ref 3.80–5.20)
RDW: 13.7 % (ref 11.5–14.5)
WBC: 20.6 10*3/uL — ABNORMAL HIGH (ref 3.6–11.0)

## 2015-12-26 LAB — URINALYSIS COMPLETE WITH MICROSCOPIC (ARMC ONLY)
Bilirubin Urine: NEGATIVE
GLUCOSE, UA: NEGATIVE mg/dL
Ketones, ur: NEGATIVE mg/dL
Leukocytes, UA: NEGATIVE
Nitrite: NEGATIVE
PROTEIN: 30 mg/dL — AB
Specific Gravity, Urine: 1.015 (ref 1.005–1.030)
pH: 7 (ref 5.0–8.0)

## 2015-12-26 LAB — BASIC METABOLIC PANEL
ANION GAP: 11 (ref 5–15)
BUN: 12 mg/dL (ref 6–20)
CO2: 22 mmol/L (ref 22–32)
Calcium: 9.1 mg/dL (ref 8.9–10.3)
Chloride: 96 mmol/L — ABNORMAL LOW (ref 101–111)
Creatinine, Ser: 0.78 mg/dL (ref 0.44–1.00)
GFR calc Af Amer: >60 mL/min (ref >60)
GFR calc non Af Amer: >60 mL/min (ref >60)
GLUCOSE: 225 mg/dL — AB (ref 65–99)
POTASSIUM: 3.5 mmol/L (ref 3.5–5.1)
Sodium: 129 mmol/L — ABNORMAL LOW (ref 135–145)

## 2015-12-26 LAB — RAPID STREP SCREEN (MED CTR MEBANE ONLY): STREPTOCOCCUS, GROUP A SCREEN (DIRECT): NEGATIVE

## 2015-12-26 MED ORDER — SULFAMETHOXAZOLE-TRIMETHOPRIM 800-160 MG PO TABS
1.0000 | ORAL_TABLET | Freq: Two times a day (BID) | ORAL | 0 refills | Status: DC
Start: 2015-12-26 — End: 2018-04-09

## 2015-12-26 MED ORDER — HYDROCODONE-ACETAMINOPHEN 5-325 MG PO TABS: ORAL_TABLET | ORAL | 0 refills | Status: DC

## 2015-12-26 NOTE — Discharge Instructions (Signed)
Recommend follow up with your Primary Care Provider tomorrow for recheck and further evaluation/referrals

## 2015-12-26 NOTE — ED Provider Notes (Signed)
[facility]    CSN: [id number] Arrival date & time: 12/26/15  1849     History   Chief Complaint Chief Complaint  Patient presents with  . Fall    HPI Becky Shea is a 82 y.o. female.   82 yo female seen earlier this morning with URI, ear and urinary symptoms started on antibiotic, now presents left upper arm pain after sustaining a fall at home this afternoon. Patient states that she was sitting on a chair at home when she suddenly felt very dizzy and fell to the floor. Patient states she thinks she "passed out". Daughter states when she arrived at her home, she found patient awake and alert but down on the floor. Patient denies any vision problems, numbness/tingling, headaches, head injury, chest pains, shortness of breath, palpitations. Currently only complains of pain to the left arm.    The history is provided by the patient and a relative.  Fall  This is a new problem. The current episode started 3 to 5 hours ago.    No past medical history on file.  There are no active problems to display for this patient.   No past surgical history on file.  OB History    No data available       Home Medications    Prior to Admission medications   Medication Sig Start Date End Date Taking? Authorizing Provider  Multiple Vitamin (MULTIVITAMIN) tablet Take 1 tablet by mouth daily.   Yes Historical Provider, MD  sulfamethoxazole-trimethoprim (BACTRIM DS,SEPTRA DS) 800-160 MG tablet Take 1 tablet by mouth 2 (two) times daily. 12/26/15  Yes Norval Gable, MD  HYDROcodone-acetaminophen (NORCO/VICODIN) 5-325 MG tablet 1 tab po q 8 hours prn severe pain 12/26/15   Norval Gable, MD    Family History No family history on file.  Social History Social History  Substance Use Topics  . Smoking status: Former Research scientist (life sciences)  . Smokeless tobacco: Never Used  . Alcohol use Yes     Comment: wine occasional     Allergies   Patient has no known  allergies.   Review of Systems Review of Systems   Physical Exam Triage Vital Signs ED Triage Vitals  Enc Vitals Group     BP 12/26/15 1920 139/76     Pulse Rate 12/26/15 1920 95     Resp 12/26/15 1920 16     Temp 12/26/15 1920 98.1 F (36.7 C)     Temp Source 12/26/15 1920 Oral     SpO2 12/26/15 1920 98 %     Weight --      Height --      Head Circumference --      Peak Flow --      Pain Score 12/26/15 1922 10     Pain Loc --      Pain Edu? --      Excl. in [place]? --    No data found.   Updated Vital Signs BP 139/76 (BP Location: Right Arm)   Pulse 95   Temp 98.1 F (36.7 C) (Oral)   Resp 16   SpO2 98%   Visual Acuity Right Eye Distance:   Left Eye Distance:   Bilateral Distance:    Right Eye Near:   Left Eye Near:    Bilateral Near:     Physical Exam  Constitutional: She is oriented to person, place, and time. She appears well-developed and well-nourished. No distress.  HENT:  Head: Normocephalic and atraumatic.  Right Ear:  External ear normal.  Left Ear: External ear normal.  Nose: Nose normal. No mucosal edema, rhinorrhea, nose lacerations, sinus tenderness, nasal deformity, septal deviation or nasal septal hematoma. No epistaxis.  No foreign bodies.  Mouth/Throat: Uvula is midline, oropharynx is clear and moist and mucous membranes are normal. No oropharyngeal exudate.  Eyes: Conjunctivae and EOM are normal. Pupils are equal, round, and reactive to light. Right eye exhibits no discharge. Left eye exhibits no discharge. No scleral icterus.  Neck: Normal range of motion. Neck supple. No thyromegaly present.  Cardiovascular: Normal rate, regular rhythm and normal heart sounds.   Pulmonary/Chest: Effort normal and breath sounds normal. No respiratory distress. She has no wheezes. She has no rales.  Musculoskeletal:       Left shoulder: She exhibits decreased range of motion, tenderness and bony tenderness. She exhibits no swelling, no effusion, no crepitus,  no deformity, no laceration, no pain, no spasm, normal pulse and normal strength.  Lymphadenopathy:    She has no cervical adenopathy.  Neurological: She is alert and oriented to person, place, and time. She displays normal reflexes. No cranial nerve deficit or sensory deficit. She exhibits normal muscle tone. Coordination normal.  Skin: She is not diaphoretic.  Psychiatric: She has a normal mood and affect. Her behavior is normal. Judgment and thought content normal.  Nursing note and vitals reviewed.    UC Treatments / Results  Labs (all labs ordered are listed, but only abnormal results are displayed) Labs Reviewed  BASIC METABOLIC PANEL - Abnormal; Notable for the following:       Result Value   Sodium 129 (*)    Chloride 96 (*)    Glucose, Bld 225 (*)    All other components within normal limits  CBC WITH DIFFERENTIAL/PLATELET - Abnormal; Notable for the following:    WBC 20.6 (*)    Neutro Abs 18.4 (*)    Lymphs Abs 0.6 (*)    Monocytes Absolute 1.5 (*)    All other components within normal limits    EKG  EKG Interpretation None       Radiology Dg Chest 2 View  Result Date: 12/26/2015 CLINICAL DATA:  82 y/o  F; fever and cough. EXAM: CHEST  2 VIEW COMPARISON:  04/12/2013 chest radiograph. FINDINGS: Stable cardiac silhouette within normal limits. Aortic atherosclerosis with arch calcification. Clear lungs. No pleural effusion or pneumothorax. Bones are demineralized. IMPRESSION: No acute pulmonary process.  Aortic atherosclerosis. Electronically Signed   By: Kristine Garbe M.D.   On: 12/26/2015 21:15   Dg Shoulder Left  Result Date: 12/26/2015 CLINICAL DATA:  Recent fall with shoulder pain, initial encounter EXAM: LEFT SHOULDER - 2+ VIEW COMPARISON:  None. FINDINGS: There is a transverse fracture through the surgical neck of the proximal left humerus. Mild impaction is noted at the fracture site. Humeral head is well located in the glenoid fossa. No other  focal abnormality is seen. IMPRESSION: Fracture through the surgical neck of the proximal left humerus. Electronically Signed   By: Inez Catalina M.D.   On: 12/26/2015 21:05   Dg Humerus Left  Result Date: 12/26/2015 CLINICAL DATA:  Recent fall with left arm pain, initial encounter EXAM: LEFT HUMERUS - 2+ VIEW COMPARISON:  None. FINDINGS: There is a transverse fracture through the surgical neck of the proximal left humerus. Some mild impaction at the fracture site is noted. No other fractures are seen. The humeral head is well located. Degenerative change of the acromioclavicular joint is seen. IMPRESSION: Fracture  through the surgical neck of the left humerus. Electronically Signed   By: Inez Catalina M.D.   On: 12/26/2015 21:04    Procedures .EKG Date/Time: 12/26/2015 9:48 PM Performed by: Norval Gable Authorized by: Norval Gable   ECG reviewed by ED Physician in the absence of a cardiologist: yes   Previous ECG:    Previous ECG:  Unavailable Interpretation:    Interpretation: normal   Rate:    ECG rate assessment: normal   Rhythm:    Rhythm: sinus rhythm   Ectopy:    Ectopy: none   QRS:    QRS axis:  Normal Conduction:    Conduction: normal   ST segments:    ST segments:  Normal T waves:    T waves: normal     (including critical care time)  Medications Ordered in UC Medications - No data to display   Initial Impression / Assessment and Plan / UC Course  I have reviewed the triage vital signs and the nursing notes.  Pertinent labs & imaging results that were available during my care of the patient were reviewed by me and considered in my medical decision making (see chart for details).  Clinical Course       Final Clinical Impressions(s) / UC Diagnoses   Final diagnoses:  Fall, initial encounter  Syncope, unspecified syncope type  Hyponatremia  Hyperglycemia  Closed supracondylar fracture of left humerus, initial encounter    New  Prescriptions Discharge Medication List as of 12/26/2015  9:29 PM    START taking these medications   Details  HYDROcodone-acetaminophen (NORCO/VICODIN) 5-325 MG tablet 1 tab po q 8 hours prn severe pain, Print       1. Labs/ekg/x-ray results and diagnosis reviewed with patient and daughter 2. rx as per orders above; reviewed possible side effects, interactions, risks and benefits  3. Recommend treatment with sling for immobilization of left humerus, pain control and follow up with orthopedist 4. Recommend supportive treatment with increased fluids 5. Follow-up with PCP tomorrow for recheck, further evaluation and referral for further work-up/management    Norval Gable, MD 12/26/15 2153

## 2015-12-26 NOTE — ED Provider Notes (Signed)
[facility]    CSN: [id number] Arrival date & time: 12/26/15  0840     History   Chief Complaint Chief Complaint  Patient presents with  . Sore Throat    HPI Becky Shea is a 82 y.o. female.   The history is provided by the patient.  Sore Throat  Pertinent negatives include no abdominal pain and no headaches.  URI  Presenting symptoms: congestion, ear pain and sore throat   Presenting symptoms: no fever   Severity:  Moderate Onset quality:  Sudden Duration:  5 days Timing:  Constant Progression:  Worsening Chronicity:  New Relieved by:  Nothing Ineffective treatments:  OTC medications Associated symptoms: no headaches, no neck pain, no sinus pain and no wheezing   Risk factors: being elderly   Risk factors: no chronic cardiac disease, no chronic kidney disease, no chronic respiratory disease, no diabetes mellitus, no immunosuppression, no recent illness, no recent travel and no sick contacts   Dysuria  Pain quality:  Aching Pain severity:  Mild Onset quality:  Sudden Duration:  12 hours Timing:  Constant Progression:  Worsening Chronicity:  New Recent urinary tract infections: no   Relieved by:  None tried Urinary symptoms: frequent urination   Urinary symptoms: no discolored urine, no foul-smelling urine, no hematuria, no hesitancy and no bladder incontinence   Associated symptoms: no abdominal pain, no fever, no flank pain, no genital lesions, no nausea, no vaginal discharge and no vomiting   Risk factors: no hx of pyelonephritis, no hx of urolithiasis, no kidney transplant, not pregnant, no recurrent urinary tract infections, no renal cysts, no renal disease, not sexually active, no sexually transmitted infections, no single kidney and no urinary catheter     History reviewed. No pertinent past medical history.  There are no active problems to display for this patient.   History reviewed. No pertinent surgical history.  OB History     No data available       Home Medications    Prior to Admission medications   Medication Sig Start Date End Date Taking? Authorizing Provider  Multiple Vitamin (MULTIVITAMIN) tablet Take 1 tablet by mouth daily.   Yes Historical Provider, MD  sulfamethoxazole-trimethoprim (BACTRIM DS,SEPTRA DS) 800-160 MG tablet Take 1 tablet by mouth 2 (two) times daily. 12/26/15   Norval Gable, MD    Family History History reviewed. No pertinent family history.  Social History Social History  Substance Use Topics  . Smoking status: Former Research scientist (life sciences)  . Smokeless tobacco: Never Used  . Alcohol use Yes     Comment: wine occasional     Allergies   Patient has no known allergies.   Review of Systems Review of Systems  Constitutional: Negative for fever.  HENT: Positive for congestion, ear pain and sore throat. Negative for sinus pain.   Respiratory: Negative for wheezing.   Gastrointestinal: Negative for abdominal pain, nausea and vomiting.  Genitourinary: Positive for dysuria. Negative for flank pain and vaginal discharge.  Musculoskeletal: Negative for neck pain.  Neurological: Negative for headaches.     Physical Exam Triage Vital Signs ED Triage Vitals  Enc Vitals Group     BP 12/26/15 0905 (!) 145/84     Pulse Rate 12/26/15 0905 100     Resp 12/26/15 0905 20     Temp 12/26/15 0905 98 F (36.7 C)     Temp Source 12/26/15 0905 Oral     SpO2 12/26/15 0905 99 %     Weight 12/26/15 0905  165 lb (74.8 kg)     Height 12/26/15 0905 5\' 4"  (1.626 m)     Head Circumference --      Peak Flow --      Pain Score 12/26/15 0908 5     Pain Loc --      Pain Edu? --      Excl. in [place]? --    No data found.   Updated Vital Signs BP (!) 145/84 (BP Location: Left Arm)   Pulse 100   Temp 98 F (36.7 C) (Oral)   Resp 20   Ht 5\' 4"  (1.626 m)   Wt 165 lb (74.8 kg)   SpO2 99%   BMI 28.32 kg/m   Visual Acuity Right Eye Distance:   Left Eye Distance:   Bilateral Distance:    Right  Eye Near:   Left Eye Near:    Bilateral Near:     Physical Exam   UC Treatments / Results  Labs (all labs ordered are listed, but only abnormal results are displayed) Labs Reviewed  URINALYSIS COMPLETEWITH MICROSCOPIC ([place]) - Abnormal; Notable for the following:       Result Value   Hgb urine dipstick MODERATE (*)    Protein, ur 30 (*)    Bacteria, UA RARE (*)    Squamous Epithelial / LPF 0-5 (*)    All other components within normal limits  RAPID STREP SCREEN (NOT AT [facility])  CULTURE, GROUP A STREP [facility])    EKG  EKG Interpretation None       Radiology No results found.  Procedures Procedures (including critical care time)  Medications Ordered in UC Medications - No data to display   Initial Impression / Assessment and Plan / UC Course  I have reviewed the triage vital signs and the nursing notes.  Pertinent labs & imaging results that were available during my care of the patient were reviewed by me and considered in my medical decision making (see chart for details).  Clinical Course       Final Clinical Impressions(s) / UC Diagnoses   Final diagnoses:  Right acute serous otitis media, recurrence not specified  Acute pharyngitis due to other specified organisms  Acute cystitis with hematuria    New Prescriptions New Prescriptions   SULFAMETHOXAZOLE-TRIMETHOPRIM (BACTRIM DS,SEPTRA DS) 800-160 MG TABLET    Take 1 tablet by mouth 2 (two) times daily.   1. Lab results and diagnosis reviewed with patient 2. rx as per orders above; reviewed possible side effects, interactions, risks and benefits  3. Recommend supportive treatment with increased water intake 4. Follow-up prn if symptoms worsen or don't improve   Norval Gable, MD 12/26/15 1045

## 2015-12-26 NOTE — ED Triage Notes (Signed)
Per patient while at home today fallen and injury left upper arm. Patient stated cannot remember what happen and how she had fall. Per daughter went home and found mom on the floor.

## 2015-12-26 NOTE — ED Triage Notes (Signed)
Pt with right sided throat pain starting yesterday. Pain 5/10.  Mild cough. Also reports frequency of urination. Denies pain. No fever.

## 2015-12-26 NOTE — Discharge Instructions (Signed)
Increase water intake

## 2015-12-28 LAB — CULTURE, GROUP A STREP (THRC)

## 2015-12-29 ENCOUNTER — Telehealth: Payer: Self-pay | Admitting: Emergency Medicine

## 2015-12-29 MED ORDER — PENICILLIN V POTASSIUM 500 MG PO TABS: 500.0000 mg | ORAL_TABLET | Freq: Three times a day (TID) | ORAL | 0 refills | Status: AC

## 2015-12-29 NOTE — Telephone Encounter (Signed)
Patient notified that her throat culture came back positive for Strep.  Dr. Zenda Alpers reviewed patient's chart and would like patient to stop the Bactrim and to start Penicillin V 500mg  1 tablet three times a day for 10 days.  Patient was instructed to stop the Bactrim and to pick up the new antibiotic at [facility] and to take the new antibiotic as directed.  Patient advised to follow-up here or with PCP if her symptoms do not improve.  Patient verbalized understanding.  The [facility] [place].

## 2016-10-13 ENCOUNTER — Ambulatory Visit
Admission: EM | Admit: 2016-10-13 | Discharge: 2016-10-13 | Disposition: A | Discharge status: Home / Self Care | Payer: Medicare Other | Attending: Family Medicine | Admitting: Family Medicine

## 2016-10-13 ENCOUNTER — Encounter: Payer: Self-pay | Admitting: *Deleted

## 2016-10-13 DIAGNOSIS — N390 Urinary tract infection, site not specified: Secondary | ICD-10-CM

## 2016-10-13 DIAGNOSIS — N3001 Acute cystitis with hematuria: Secondary | ICD-10-CM | POA: Diagnosis not present

## 2016-10-13 LAB — URINALYSIS, COMPLETE (UACMP) WITH MICROSCOPIC
Bilirubin Urine: NEGATIVE
Glucose, UA: NEGATIVE mg/dL
Ketones, ur: NEGATIVE mg/dL
NITRITE: NEGATIVE
Protein, ur: NEGATIVE mg/dL
SPECIFIC GRAVITY, URINE: 1.02 (ref 1.005–1.030)
pH: 7 (ref 5.0–8.0)

## 2016-10-13 MED ORDER — CEPHALEXIN 500 MG PO CAPS: 500.0000 mg | ORAL_CAPSULE | Freq: Two times a day (BID) | ORAL | 0 refills | Status: DC

## 2016-10-13 MED ORDER — PHENAZOPYRIDINE HCL 200 MG PO TABS: 200.0000 mg | ORAL_TABLET | Freq: Three times a day (TID) | ORAL | 0 refills | Status: DC | PRN

## 2016-10-13 NOTE — ED Provider Notes (Signed)
[facility]    CSN: [id number] Arrival date & time: 10/13/16  2774     History   Chief Complaint Chief Complaint  Patient presents with  . Urinary Tract Infection    HPI Becky Shea is a 83 y.o. female.   Patient is an 83 year old white female with a history of dysuria this morning. According to her daughter she had a bad UTI about a year ago. We saw her treated with antibiotic and she subsequently fell that afternoon while at home after she had a fever and fell off her chair and fractured her shoulder. She had difficulty taking the antibiotic course of Septra that time the stomach and daughter does not know what it was the antibiotic or factors also narcotic for the shoulder fracture. No known drug allergies she does not smoke. She does drink wine on a regular basis. She is a former smoker. No known drug allergies. No pertinent family medical history relevant to today's visit   The history is provided by the patient and a relative. No language interpreter was used.  Urinary Tract Infection  Pain quality:  Sharp and burning Pain severity:  Moderate Onset quality:  Sudden Timing:  Constant Progression:  Worsening Chronicity:  New Recent urinary tract infections: no   Relieved by:  Nothing Urinary symptoms: discolored urine   Associated symptoms: abdominal pain   Risk factors: hx of pyelonephritis   Risk factors: no recurrent urinary tract infections     No past medical history on file.  There are no active problems to display for this patient.   No past surgical history on file.  OB History    No data available       Home Medications    Prior to Admission medications   Medication Sig Start Date End Date Taking? Authorizing Provider  cephALEXin (KEFLEX) 500 MG capsule Take 1 capsule (500 mg total) by mouth 2 (two) times daily. 10/13/16   Frederich Cha, MD  HYDROcodone-acetaminophen (NORCO/VICODIN) 5-325 MG tablet 1 tab po q 8 hours prn severe  pain 12/26/15   Norval Gable, MD  Multiple Vitamin (MULTIVITAMIN) tablet Take 1 tablet by mouth daily.    [provider]  phenazopyridine (PYRIDIUM) 200 MG tablet Take 1 tablet (200 mg total) by mouth 3 (three) times daily as needed for pain. 10/13/16   Frederich Cha, MD  sulfamethoxazole-trimethoprim (BACTRIM DS,SEPTRA DS) 800-160 MG tablet Take 1 tablet by mouth 2 (two) times daily. 12/26/15   Norval Gable, MD    Family History No family history on file.  Social History Social History  Substance Use Topics  . Smoking status: Former Research scientist (life sciences)  . Smokeless tobacco: Never Used  . Alcohol use Yes     Comment: wine at night     Allergies   Patient has no known allergies.   Review of Systems Review of Systems  Gastrointestinal: Positive for abdominal pain.  Genitourinary: Positive for dysuria and frequency.  All other systems reviewed and are negative.    Physical Exam Triage Vital Signs ED Triage Vitals  Enc Vitals Group     BP 10/13/16 0947 (!) 157/89     Pulse Rate 10/13/16 0947 88     Resp --      Temp 10/13/16 0947 98.1 F (36.7 C)     Temp Source 10/13/16 0947 Oral     SpO2 10/13/16 0947 97 %     Weight 10/13/16 0939 167 lb (75.8 kg)     Height 10/13/16  3662 5\' 4"  (1.626 m)     Head Circumference --      Peak Flow --      Pain Score --      Pain Loc --      Pain Edu? --      Excl. in [place]? --    No data found.   Updated Vital Signs BP (!) 157/89 (BP Location: Right Arm)   Pulse 88   Temp 98.1 F (36.7 C) (Oral)   Ht 5\' 4"  (1.626 m)   Wt 167 lb (75.8 kg)   SpO2 97%   BMI 28.67 kg/m   Visual Acuity Right Eye Distance:   Left Eye Distance:   Bilateral Distance:    Right Eye Near:   Left Eye Near:    Bilateral Near:     Physical Exam  Constitutional: She is oriented to person, place, and time. She appears well-developed and well-nourished.  HENT:  Head: Normocephalic and atraumatic.  Right Ear: External ear normal.  Left Ear:  External ear normal.  Eyes: Pupils are equal, round, and reactive to light. EOM are normal.  Neck: Normal range of motion.  Pulmonary/Chest: Effort normal.  Abdominal: Soft. Bowel sounds are normal. She exhibits no distension. There is no tenderness.  Musculoskeletal: Normal range of motion.  Neurological: She is alert and oriented to person, place, and time.  Skin: Skin is warm.  Psychiatric: She has a normal mood and affect.  Vitals reviewed.    UC Treatments / Results  Labs (all labs ordered are listed, but only abnormal results are displayed) Labs Reviewed  URINALYSIS, COMPLETE (UACMP) WITH MICROSCOPIC - Abnormal; Notable for the following:       Result Value   APPearance CLOUDY (*)    Hgb urine dipstick MODERATE (*)    Leukocytes, UA LARGE (*)    Squamous Epithelial / LPF 0-5 (*)    Bacteria, UA FEW (*)    All other components within normal limits  URINE CULTURE    EKG  EKG Interpretation None       Radiology No results found.  Procedures Procedures (including critical care time)  Medications Ordered in UC Medications - No data to display  Results for orders placed or performed during the hospital encounter of 10/13/16  Urinalysis, Complete w Microscopic  Result Value Ref Range   Color, Urine YELLOW YELLOW   APPearance CLOUDY (A) CLEAR   Specific Gravity, Urine 1.020 1.005 - 1.030   pH 7.0 5.0 - 8.0   Glucose, UA NEGATIVE NEGATIVE mg/dL   Hgb urine dipstick MODERATE (A) NEGATIVE   Bilirubin Urine NEGATIVE NEGATIVE   Ketones, ur NEGATIVE NEGATIVE mg/dL   Protein, ur NEGATIVE NEGATIVE mg/dL   Nitrite NEGATIVE NEGATIVE   Leukocytes, UA LARGE (A) NEGATIVE   Squamous Epithelial / LPF 0-5 (A) NONE SEEN   WBC, UA TOO NUMEROUS TO COUNT 0 - 5 WBC/hpf   RBC / HPF 6-30 0 - 5 RBC/hpf   Bacteria, UA FEW (A) NONE SEEN   Initial Impression / Assessment and Plan / UC Course  I have reviewed the triage vital signs and the nursing notes.  Pertinent labs & imaging  results that were available during my care of the patient were reviewed by me and considered in my medical decision making (see chart for details).     Urine culture was ordered we'll place patient on Keflex 500 mg 1 capsule twice a day for 7 days also have Pyridium. She states she does  not get yeast infection. Follow-up PCP in about 2 weeks if needed.  Final Clinical Impressions(s) / UC Diagnoses   Final diagnoses:  Lower urinary tract infectious disease  Acute cystitis with hematuria    New Prescriptions New Prescriptions   CEPHALEXIN (KEFLEX) 500 MG CAPSULE    Take 1 capsule (500 mg total) by mouth 2 (two) times daily.   PHENAZOPYRIDINE (PYRIDIUM) 200 MG TABLET    Take 1 tablet (200 mg total) by mouth 3 (three) times daily as needed for pain.   Note: This dictation was prepared with Dragon dictation along with smaller phrase technology. Any transcriptional errors that result from this process are unintentional. Controlled Substance Prescriptions [place] Controlled Substance Registry consulted? Not Applicable   Frederich Cha, MD 10/13/16 1039

## 2016-10-15 LAB — URINE CULTURE

## 2018-02-08 ENCOUNTER — Encounter: Payer: Self-pay | Admitting: Emergency Medicine

## 2018-02-08 ENCOUNTER — Other Ambulatory Visit: Payer: Self-pay

## 2018-02-08 ENCOUNTER — Ambulatory Visit
Admission: EM | Admit: 2018-02-08 | Discharge: 2018-02-08 | Disposition: A | Discharge status: Home / Self Care | Payer: Medicare Other | Attending: Family Medicine | Admitting: Family Medicine

## 2018-02-08 DIAGNOSIS — M545 Low back pain, unspecified: Secondary | ICD-10-CM

## 2018-02-08 LAB — URINALYSIS, COMPLETE (UACMP) WITH MICROSCOPIC
Bacteria, UA: NONE SEEN
Bilirubin Urine: NEGATIVE
Glucose, UA: NEGATIVE mg/dL
KETONES UR: NEGATIVE mg/dL
LEUKOCYTES UA: NEGATIVE
NITRITE: NEGATIVE
PH: 7 (ref 5.0–8.0)
Protein, ur: NEGATIVE mg/dL
SQUAMOUS EPITHELIAL / LPF: NONE SEEN (ref 0–5)
Specific Gravity, Urine: 1.015 (ref 1.005–1.030)
WBC, UA: NONE SEEN WBC/hpf (ref 0–5)

## 2018-02-08 MED ORDER — PREDNISONE 20 MG PO TABS: 20.0000 mg | ORAL_TABLET | Freq: Every day | ORAL | 0 refills | Status: DC

## 2018-02-08 NOTE — ED Provider Notes (Signed)
[facility]    CSN: [id number] Arrival date & time: 02/08/18  1154     History   Chief Complaint Chief Complaint  Patient presents with  . Back Pain    APPT    HPI Becky Shea is a 85 y.o. female.   85 yo female with a c/o 4 day h/o low back pain. Denies any falls or other traumatic injury. Pain does not radiate. Denies any bowel or bladder problems, numbness/tingling, rash.    The history is provided by the patient.    History reviewed. No pertinent past medical history.  There are no active problems to display for this patient.   History reviewed. No pertinent surgical history.  OB History   No obstetric history on file.      Home Medications    Prior to Admission medications   Medication Sig Start Date End Date Taking? Authorizing Provider  cephALEXin (KEFLEX) 500 MG capsule Take 1 capsule (500 mg total) by mouth 2 (two) times daily. 10/13/16   Frederich Cha, MD  HYDROcodone-acetaminophen (NORCO/VICODIN) 5-325 MG tablet 1 tab po q 8 hours prn severe pain 12/26/15   Norval Gable, MD  Multiple Vitamin (MULTIVITAMIN) tablet Take 1 tablet by mouth daily.    [provider]  phenazopyridine (PYRIDIUM) 200 MG tablet Take 1 tablet (200 mg total) by mouth 3 (three) times daily as needed for pain. 10/13/16   Frederich Cha, MD  predniSONE (DELTASONE) 20 MG tablet Take 1 tablet (20 mg total) by mouth daily. 02/08/18   Norval Gable, MD  sulfamethoxazole-trimethoprim (BACTRIM DS,SEPTRA DS) 800-160 MG tablet Take 1 tablet by mouth 2 (two) times daily. 12/26/15   Norval Gable, MD    Family History History reviewed. No pertinent family history.  Social History Social History   Tobacco Use  . Smoking status: Former Research scientist (life sciences)  . Smokeless tobacco: Never Used  Substance Use Topics  . Alcohol use: Yes    Comment: wine at night  . Drug use: No     Allergies   Patient has no known allergies.   Review of Systems Review of  Systems   Physical Exam Triage Vital Signs ED Triage Vitals  Enc Vitals Group     BP 02/08/18 1217 (!) 148/101     Pulse Rate 02/08/18 1217 92     Resp 02/08/18 1217 18     Temp 02/08/18 1217 98.3 F (36.8 C)     Temp Source 02/08/18 1217 Oral     SpO2 02/08/18 1217 100 %     Weight 02/08/18 1215 162 lb (73.5 kg)     Height 02/08/18 1215 5\' 4"  (1.626 m)     Head Circumference --      Peak Flow --      Pain Score 02/08/18 1214 7     Pain Loc --      Pain Edu? --      Excl. in [place]? --    No data found.  Updated Vital Signs BP (!) 148/101 (BP Location: Right Arm)   Pulse 92   Temp 98.3 F (36.8 C) (Oral)   Resp 18   Ht 5\' 4"  (1.626 m)   Wt 73.5 kg   SpO2 100%   BMI 27.81 kg/m   Visual Acuity Right Eye Distance:   Left Eye Distance:   Bilateral Distance:    Right Eye Near:   Left Eye Near:    Bilateral Near:     Physical Exam Vitals signs and  nursing note reviewed.  Constitutional:      General: She is not in acute distress.    Appearance: Normal appearance. She is not ill-appearing, toxic-appearing or diaphoretic.  Musculoskeletal:     Lumbar back: She exhibits tenderness. She exhibits normal range of motion, no swelling, no edema, no deformity, no laceration, no pain, no spasm and normal pulse.  Neurological:     Mental Status: She is alert.      UC Treatments / Results  Labs (all labs ordered are listed, but only abnormal results are displayed) Labs Reviewed  URINALYSIS, COMPLETE (UACMP) WITH MICROSCOPIC - Abnormal; Notable for the following components:      Result Value   Color, Urine STRAW (*)    Hgb urine dipstick SMALL (*)    All other components within normal limits    EKG None  Radiology No results found.  Procedures Procedures (including critical care time)  Medications Ordered in UC Medications - No data to display  Initial Impression / Assessment and Plan / UC Course  I have reviewed the triage vital signs and the nursing  notes.  Pertinent labs & imaging results that were available during my care of the patient were reviewed by me and considered in my medical decision making (see chart for details).     Final Clinical Impressions(s) / UC Diagnoses   Final diagnoses:  Acute midline low back pain without sciatica     Discharge Instructions     Arthritis Tylenol take 2 tabs orally three times    ED Prescriptions    Medication Sig Dispense Auth. Provider   predniSONE (DELTASONE) 20 MG tablet Take 1 tablet (20 mg total) by mouth daily. 7 tablet Norval Gable, MD     1. diagnosis reviewed with patient 2. rx as per orders above; reviewed possible side effects, interactions, risks and benefits  3. Recommend supportive treatment with heat, rest 4. Follow-up prn if symptoms worsen or don't improve   Controlled Substance Prescriptions [place] Controlled Substance Registry consulted? Not Applicable   Norval Gable, MD 02/08/18 [id number]

## 2018-02-08 NOTE — ED Triage Notes (Signed)
Patient c/o low back pain that started Friday. Patient denies urinary symptoms but does state she gets urinary tract infections.

## 2018-02-08 NOTE — Discharge Instructions (Signed)
Arthritis Tylenol take 2 tabs orally three times

## 2018-03-15 ENCOUNTER — Ambulatory Visit (INDEPENDENT_AMBULATORY_CARE_PROVIDER_SITE_OTHER): Payer: Medicare Other

## 2018-03-15 ENCOUNTER — Ambulatory Visit
Admission: EM | Admit: 2018-03-15 | Discharge: 2018-03-15 | Disposition: A | Discharge status: Home / Self Care | Payer: Medicare Other | Attending: Family Medicine | Admitting: Family Medicine

## 2018-03-15 DIAGNOSIS — M47815 Spondylosis without myelopathy or radiculopathy, thoracolumbar region: Secondary | ICD-10-CM

## 2018-03-15 DIAGNOSIS — R030 Elevated blood-pressure reading, without diagnosis of hypertension: Secondary | ICD-10-CM | POA: Diagnosis not present

## 2018-03-15 DIAGNOSIS — M5441 Lumbago with sciatica, right side: Secondary | ICD-10-CM

## 2018-03-15 MED ORDER — PREDNISONE 20 MG PO TABS: ORAL_TABLET | ORAL | 0 refills | Status: DC

## 2018-03-15 NOTE — ED Triage Notes (Signed)
Pt here for low back pain for the past few weeks and does see a chiropractor and it does give her some temporary relief and states she can hardly sleep. Daughter reports arthritis and chiropractor suggests a muscle relaxer and anti inflammatory. No urinary symptoms reported.

## 2018-03-15 NOTE — ED Provider Notes (Signed)
[facility] ____________________________________________  Time seen: Approximately 7:43 PM  I have reviewed the triage vital signs and the nursing notes.   HISTORY  Chief Complaint Back Pain   HPI [place] is a 85 y.o. female presenting with daughter at bedside, for evaluation of low back pain present for approximately 3 to 4 weeks.  Denies any recent fall, injury or trauma.  States was treated initially with low-dose prednisone and over-the-counter Tylenol which helped minimally but no change.  States has been seeing a Restaurant manager, fast food and has been following with chiropractor, but reports pain is continued.  Patient and daughter states that chiropractor referred to urgent care for anti-inflammatory medication assistance.  Patient states the chiropractor adjustments does help some, but states that they know that she has arthritis.  Denies any dysuria, abdominal pain, chest pain, shortness of breath, fevers, rash.  Denies any decreased range of motion.  Has continue to remain active.  Continues with stretching.  Denies any renal insufficiency or cardiac history.  Has been applying heat as well, and states that she applied heat a lot today.  States has difficulty getting comfortable at night.  Pain does intermittently radiate down right leg to right knee is a sharp intermittent pain, but does not last for more than a few minutes.  Denies any paresthesias, unilateral weakness, urinary or bowel retention or incontinence.  Reports otherwise doing well.  History reviewed. No pertinent past medical history.  There are no active problems to display for this patient.   History reviewed. No pertinent surgical history.   No current facility-administered medications for this encounter.   Current Outpatient Medications:  .  cephALEXin (KEFLEX) 500 MG capsule, Take 1 capsule (500 mg total) by mouth 2 (two) times daily., Disp: 14 capsule, Rfl: 0 .  HYDROcodone-acetaminophen  (NORCO/VICODIN) 5-325 MG tablet, 1 tab po q 8 hours prn severe pain, Disp: 10 tablet, Rfl: 0 .  Multiple Vitamin (MULTIVITAMIN) tablet, Take 1 tablet by mouth daily., Disp: , Rfl:  .  phenazopyridine (PYRIDIUM) 200 MG tablet, Take 1 tablet (200 mg total) by mouth 3 (three) times daily as needed for pain., Disp: 15 tablet, Rfl: 0 .  predniSONE (DELTASONE) 20 MG tablet, Take 1 tablet (20 mg total) by mouth daily., Disp: 7 tablet, Rfl: 0 .  predniSONE (DELTASONE) 20 MG tablet, Take 2 tablets (40mg ) orally daily for 4 days, then 1 tablet (20mg ) oral daily for 4 days., Disp: 12 tablet, Rfl: 0 .  sulfamethoxazole-trimethoprim (BACTRIM DS,SEPTRA DS) 800-160 MG tablet, Take 1 tablet by mouth 2 (two) times daily., Disp: 14 tablet, Rfl: 0  Allergies Patient has no known allergies.  History reviewed. No pertinent family history.  Social History Social History   Tobacco Use  . Smoking status: Former Research scientist (life sciences)  . Smokeless tobacco: Never Used  Substance Use Topics  . Alcohol use: Yes    Comment: wine at night  . Drug use: No    Review of Systems Constitutional: No fever Cardiovascular: Denies chest pain. Respiratory: Denies shortness of breath. Gastrointestinal: No abdominal pain.  Musculoskeletal: Positive for back pain. Skin: Negative for rash.   ____________________________________________   PHYSICAL EXAM:  VITAL SIGNS: ED Triage Vitals  Enc Vitals Group     BP 03/15/18 1814 (!) 169/106     Pulse Rate 03/15/18 1814 86     Resp 03/15/18 1814 18     Temp 03/15/18 1814 98.2 F (36.8 C)     Temp Source 03/15/18 1814 Oral  SpO2 03/15/18 1814 96 %     Weight 03/15/18 1814 162 lb (73.5 kg)     Height 03/15/18 1814 5\' 4"  (1.626 m)     Head Circumference --      Peak Flow --      Pain Score 03/15/18 1812 9     Pain Loc --      Pain Edu? --      Excl. in [place]? --     Constitutional: Alert and oriented. Well appearing and in no acute distress. ENT      Head: Normocephalic and  atraumatic. Cardiovascular: Normal rate, regular rhythm. Grossly normal heart sounds.  Good peripheral circulation. Respiratory: Normal respiratory effort without tachypnea nor retractions. Breath sounds are clear and equal bilaterally. No wheezes, rales, rhonchi. Gastrointestinal: Soft and nontender.No CVA tenderness. Musculoskeletal: No midline cervical or thoracic tenderness palpation.  Bilateral pedal pulses equal and easily palpated. Except: Lower lumbar at approximate L4-L5 minimal direct tenderness to palpation, right paralumbar L4-L5 tenderness of palpation as well as L5-S1 and along right greater sciatic notch, mild piriformis tenderness, no right hip tenderness, no pain with standing bilateral knee lifts, no saddle anesthesia, able to weight-bear each hip, steady gait. Neurologic:  Normal speech and language. No gross focal neurologic deficits are appreciated. Speech is normal. No gait instability.  Skin:  Skin is warm, dry and intact. No rash noted. Psychiatric: Mood and affect are normal. Speech and behavior are normal. Patient exhibits appropriate insight and judgment   ___________________________________________   LABS (all labs ordered are listed, but only abnormal results are displayed)  Labs Reviewed - No data to display ____________________________________________   RADIOLOGY  Dg Lumbar Spine Complete  Result Date: 03/15/2018 CLINICAL DATA:  Intermittent right-sided lower back pain for 2 months. EXAM: LUMBAR SPINE - COMPLETE 4+ VIEW COMPARISON:  None. FINDINGS: The bones are demineralized in appearance. There are 5 non ribbed lumbar type vertebral bodies with age indeterminate mild height loss of L2 and L3 from compression fractures of the inferior endplate of L2 and superior endplate of L3. No retropulsion. Degenerative disc disease is noted at all levels of the lumbar spine greatest between L3 and S1. Facet hypertrophy and sclerosis with joint space narrowing is noted from  L4 through S1. No pars defects or listhesis. Advanced osteoarthritic joint space narrowing of the right hip with moderate joint space narrowing of the left hip. Subchondral degenerative cystic change and sclerosis is identified across the right hip joint. Osteoarthritis of the right sacroiliac joint relative to left. No pelvic diastasis. Moderate stool retention within the overlying included:[name] Aortoiliac atherosclerotic calcifications are identified without definite aneurysm. IMPRESSION: 1. Age-indeterminate mild height loss of L2 and L3 from compression fractures of the inferior endplate of L2 and superior endplate of L3. No retropulsion. 2. Lumbar spondylosis greatest between L3 and S1. 3. Right sacroiliac joint osteoarthritis. 4. Right greater than left hip joint osteoarthritis. Electronically Signed   By: Ashley Royalty M.D.   On: 03/15/2018 20:09   ____________________________________________   PROCEDURES Procedures    INITIAL IMPRESSION / ASSESSMENT AND PLAN / ED COURSE  Pertinent labs & imaging results that were available during my care of the patient were reviewed by me and considered in my medical decision making (see chart for details).  Overall well-appearing patient.  No acute distress.  Patient and daughter denies chronic medical problems.  Presenting for evaluation of continued lower back pain.  Suspect osteoarthritis with acute inflammation and sciatica.  No focal neurological deficit.  We will plan to treat with prednisone.  Will evaluate lumbar x-ray.  Recommend orthopedic follow-up.  Lumbar x-ray as above per radiology, numerous areas of osteoarthritis, lumbar spondylosis as well as L2-L3 compression fractures.  No recent falls, doubt acute.  Will treat with prednisone, continue home Tylenol as needed.  Orthopedic information given and recommended to have this follow-up.  Continue to work to establish local primary care.  Discussed sooner return parameters including up to proceed to  the ER for increased pain.Discussed indication, risks and benefits of medications with patient.   Discussed follow up and return parameters including no resolution or any worsening concerns. Patient and daughter verbalized understanding and agreed to plan.   ____________________________________________   FINAL CLINICAL IMPRESSION(S) / ED DIAGNOSES  Final diagnoses:  Acute right-sided low back pain with right-sided sciatica  Osteoarthritis of thoracolumbar spine, unspecified spinal osteoarthritis complication status     ED Discharge Orders         Ordered    predniSONE (DELTASONE) 20 MG tablet     03/15/18 2020           Note: This dictation was prepared with Dragon dictation along with smaller phrase technology. Any transcriptional errors that result from this process are unintentional.         Marylene Land, NP 03/15/18 2045

## 2018-03-15 NOTE — Discharge Instructions (Signed)
Take medication as prescribed. Ice. Rest.  Follow-up with orthopedic as soon as possible.  Continue to work to establish primary care.  Return to Urgent care for new or worsening concerns.

## 2018-03-17 DIAGNOSIS — M1611 Unilateral primary osteoarthritis, right hip: Secondary | ICD-10-CM | POA: Insufficient documentation

## 2018-03-17 DIAGNOSIS — M47816 Spondylosis without myelopathy or radiculopathy, lumbar region: Secondary | ICD-10-CM | POA: Insufficient documentation

## 2018-03-17 HISTORY — DX: Unilateral primary osteoarthritis, right hip: M16.11

## 2018-04-09 ENCOUNTER — Ambulatory Visit (INDEPENDENT_AMBULATORY_CARE_PROVIDER_SITE_OTHER): Payer: Medicare Other | Admitting: Family Medicine

## 2018-04-09 ENCOUNTER — Other Ambulatory Visit: Payer: Self-pay

## 2018-04-09 ENCOUNTER — Encounter: Payer: Self-pay | Admitting: Family Medicine

## 2018-04-09 VITALS — BP 124/70 | HR 76 | Ht 64.0 in | Wt 153.0 lb

## 2018-04-09 DIAGNOSIS — D229 Melanocytic nevi, unspecified: Secondary | ICD-10-CM | POA: Diagnosis not present

## 2018-04-09 DIAGNOSIS — L723 Sebaceous cyst: Secondary | ICD-10-CM | POA: Insufficient documentation

## 2018-04-09 DIAGNOSIS — Z7689 Persons encountering health services in other specified circumstances: Secondary | ICD-10-CM

## 2018-04-09 NOTE — Patient Instructions (Signed)
Chondroitin; Glucosamine tablets or capsules What is this medicine? CHONDROITIN; GLUCOSAMINE (chon DROI tin; gloo KOH suh meen) is a dietary supplement. It is promoted for its ability to reduce the symptoms of osteoarthritis by maintaining healthy joint cartilage. The FDA has not approved this supplement for any medical use. This medicine may be used for other purposes; ask your health care provider or pharmacist if you have questions. COMMON BRAND NAME(S): OptiFlex Complete, OptiFlex Sport What should I tell my health care provider before I take this medicine? They need to know if you have any of these conditions: -diabetes -heart disease -kidney disease -liver disease -stomach or intestinal problems -an unusual or allergic reaction to chondroitin, glucosamine, sulfonamides, other medicines, foods, dyes, or preservatives -pregnant or trying to get pregnant -breast-feeding How should I use this medicine? Take this supplement by mouth with a glass of water. Follow the directions on the package labeling, or take as directed by your health care professional. Take your medicine at regular intervals. Do not take this supplement more often than directed. Talk to your pediatrician regarding the use of this medicine in children. Special care may be needed. Overdosage: If you think you have taken too much of this medicine contact a poison control center or emergency room at once. NOTE: This medicine is only for you. Do not share this medicine with others. What if I miss a dose? If you miss a dose, take it as soon as you can. If it is almost time for your next dose, take only that dose. Do not take double or extra doses. What may interact with this medicine? Check with your doctor or healthcare professional if you are taking any of the following medications: -warfarin This list may not describe all possible interactions. Give your health care provider a list of all the medicines, herbs,  non-prescription drugs, or dietary supplements you use. Also tell them if you smoke, drink alcohol, or use illegal drugs. Some items may interact with your medicine. What should I watch for while using this medicine? Tell your doctor or healthcare professional if your symptoms do not start to get better or if they get worse. This supplement may take several weeks to work for you. If you are scheduled for any medical or dental procedure, tell your healthcare provider that you are taking this supplement. You may need to stop taking this supplement before the procedure. This supplement may affect blood sugar levels. If you have diabetes, check with your doctor or health care professional before you change your diet or the dose of your diabetic medicine. Herbal or dietary supplements are not regulated like medicines. Rigid quality control standards are not required for dietary supplements. The purity and strength of these products can vary. The safety and effect of this dietary supplement for a certain disease or illness is not well known. This product is not intended to diagnose, treat, cure or prevent any disease. The Food and Drug Administration suggests the following to help consumers protect themselves: -Always read product labels and follow directions. -Natural does not mean a product is safe for humans to take. -Look for products that include USP after the ingredient name. This means that the manufacturer followed the standards of the [place] Pharmacopoeia. -Supplements made or sold by a nationally known food or drug company are more likely to be made under tight controls. You can write to the company for more information about how the product was made. What side effects may I notice from receiving this  medicine? Side effects that you should report to your doctor or health care professional as soon as possible: -allergic reactions like skin rash, itching or hives, swelling of the face, lips, or  tongue -breathing problems -constipation -diarrhea -difficulty sleeping -drowsiness -hair loss -headache -loss of appetite -stomach pain -swelling of the ankles or feet Side effects that usually do not require medical attention (report to your doctor or health care professional if they continue or are bothersome): -gas -nausea -upset stomach This list may not describe all possible side effects. Call your doctor for medical advice about side effects. You may report side effects to FDA at 1-800-FDA-1088. Where should I keep my medicine? Keep out of the reach of children. Store at room temperature between 15 and 30 degrees C (59 and 86 degrees F) or as directed on the package label. Protect from moisture. Throw away any unused supplement after the expiration date. NOTE: This sheet is a summary. It may not cover all possible information. If you have questions about this medicine, talk to your doctor, pharmacist, or health care provider.  2019 Elsevier/Gold Standard (2014-02-06 06:58:19)

## 2018-04-09 NOTE — Progress Notes (Signed)
Date:  04/09/2018   Name:  Becky Shea [facility]   DOB:  1931-05-09   MRN:  [id number]   Chief Complaint: [place] and prevnar 13  Patient is a 85 year old female who presents for a comprehensive physical exam. The patient reports the following problems: none. Health maintenance has been reviewed up to date.   Review of Systems  Constitutional: Negative.  Negative for chills, fatigue, fever and unexpected weight change.  HENT: Negative for congestion, ear discharge, ear pain, hearing loss, mouth sores, nosebleeds, postnasal drip, rhinorrhea, sinus pressure, sneezing and sore throat.   Eyes: Negative for photophobia, pain, discharge, redness and itching.  Respiratory: Negative for cough, shortness of breath, wheezing and stridor.   Gastrointestinal: Negative for abdominal pain, blood in stool, constipation, diarrhea, nausea and vomiting.  Endocrine: Negative for cold intolerance, heat intolerance, polydipsia, polyphagia and polyuria.  Genitourinary: Negative for dysuria, flank pain, frequency, hematuria, menstrual problem, pelvic pain, urgency, vaginal bleeding and vaginal discharge.  Musculoskeletal: Negative for arthralgias, back pain and myalgias.  Skin: Negative for rash.  Allergic/Immunologic: Negative for environmental allergies and food allergies.  Neurological: Negative for dizziness, weakness, light-headedness, numbness and headaches.  Hematological: Negative for adenopathy. Does not bruise/bleed easily.  Psychiatric/Behavioral: Negative for dysphoric mood. The patient is not nervous/anxious.     Patient Active Problem List   Diagnosis Date Noted  . Sebaceous cyst 04/09/2018  . Lumbar spondylosis 03/17/2018  . Primary osteoarthritis of right hip 03/17/2018  . Primary osteoarthritis of both knees 12/27/2014    No Known Allergies  History reviewed. No pertinent surgical history.  Social History   Tobacco Use  . Smoking status: Never Smoker  . Smokeless  tobacco: Never Used  Substance Use Topics  . Alcohol use: Yes    Comment: wine at night  . Drug use: No     Medication list has been reviewed and updated.  Current Meds  Medication Sig  . acetaminophen (TYLENOL) 500 MG tablet Take 1,000 mg by mouth every 6 (six) hours as needed. otc  . AZO-CRANBERRY PO Take 2 tablets by mouth daily. otc  . Boswellia Serrata (BOSWELLIA PO) Take 2 capsules by mouth daily. otc  . gabapentin (NEURONTIN) 300 MG capsule Take 900 mg by mouth 3 (three) times daily. Dr Telhon/ [facility]  . ibuprofen (ADVIL,MOTRIN) 200 MG tablet Take 800 mg by mouth every 6 (six) hours as needed. otc  . Magnesium 500 MG TABS Take 1 tablet by mouth daily. otc  . Multiple Vitamins-Minerals (VITAMIN D3 COMPLETE) TABS Take 1 capsule by mouth daily. otc  . Multiple Vitamins-Minerals (WOMENS MULTI GUMMIES) CHEW Chew 2 each by mouth daily. otc  . Potassium 99 MG TABS Take 1 tablet by mouth daily. otc  . traMADol (ULTRAM) 50 MG tablet Take by mouth. Dr Roland Rack    [facility] 2/9 Scores 04/09/2018  PHQ - 2 Score 0  PHQ- 9 Score 0    Physical Exam Vitals signs reviewed.  Constitutional:      General: She is not in acute distress.    Appearance: She is not diaphoretic.  HENT:     Head: Normocephalic and atraumatic.     Right Ear: Tympanic membrane, ear canal and external ear normal.     Left Ear: Tympanic membrane, ear canal and external ear normal.     Nose: Nose normal.  Eyes:     General:        Right eye: No discharge.  Left eye: No discharge.     Conjunctiva/sclera: Conjunctivae normal.     Pupils: Pupils are equal, round, and reactive to light.  Neck:     Musculoskeletal: Normal range of motion and neck supple.     Thyroid: No thyromegaly.     Vascular: No JVD.  Cardiovascular:     Rate and Rhythm: Normal rate and regular rhythm.     Heart sounds: Normal heart sounds. No murmur. No friction rub. No gallop.   Pulmonary:     Effort: Pulmonary effort is normal. No  respiratory distress.     Breath sounds: Normal breath sounds. No stridor. No wheezing, rhonchi or rales.  Chest:     Chest wall: No tenderness.  Abdominal:     General: Bowel sounds are normal.     Palpations: Abdomen is soft. There is no mass.     Tenderness: There is no abdominal tenderness. There is no guarding.  Musculoskeletal: Normal range of motion.  Lymphadenopathy:     Cervical: No cervical adenopathy.  Skin:    General: Skin is warm and dry.  Neurological:     Mental Status: She is alert.     Deep Tendon Reflexes: Reflexes are normal and symmetric.     Wt Readings from Last 3 Encounters:  04/09/18 153 lb (69.4 kg)  03/15/18 162 lb (73.5 kg)  02/08/18 162 lb (73.5 kg)    BP 124/70   Pulse 76   Ht 5\' 4"  (1.626 m)   Wt 153 lb (69.4 kg)   BMI 26.26 kg/m   Assessment and Plan:

## 2018-04-16 ENCOUNTER — Inpatient Hospital Stay: Admission: RE | Admit: 2018-04-16 | Payer: Medicare Other | Source: Ambulatory Visit

## 2018-04-19 ENCOUNTER — Other Ambulatory Visit: Payer: Medicare Other

## 2018-04-21 ENCOUNTER — Other Ambulatory Visit: Payer: Medicare Other

## 2018-04-27 ENCOUNTER — Inpatient Hospital Stay: Admit: 2018-04-27 | Payer: Medicare Other | Admitting: Surgery

## 2018-04-27 SURGERY — ARTHROPLASTY, HIP, TOTAL,POSTERIOR APPROACH
Anesthesia: Choice | Laterality: Right

## 2018-07-05 ENCOUNTER — Encounter
Admission: RE | Admit: 2018-07-05 | Discharge: 2018-07-05 | Disposition: A | Discharge status: Home / Self Care | Payer: Medicare Other | Source: Ambulatory Visit | Attending: Surgery | Admitting: Surgery

## 2018-07-05 ENCOUNTER — Other Ambulatory Visit: Payer: Self-pay

## 2018-07-05 DIAGNOSIS — Z01818 Encounter for other preprocedural examination: Secondary | ICD-10-CM | POA: Diagnosis present

## 2018-07-05 DIAGNOSIS — Z01810 Encounter for preprocedural cardiovascular examination: Secondary | ICD-10-CM

## 2018-07-05 HISTORY — DX: Unspecified osteoarthritis, unspecified site: M19.90

## 2018-07-05 HISTORY — DX: Gastro-esophageal reflux disease without esophagitis: K21.9

## 2018-07-05 LAB — URINALYSIS, ROUTINE W REFLEX MICROSCOPIC
Bilirubin Urine: NEGATIVE
Glucose, UA: NEGATIVE mg/dL
Hgb urine dipstick: NEGATIVE
Ketones, ur: NEGATIVE mg/dL
Leukocytes,Ua: NEGATIVE
Nitrite: NEGATIVE
Protein, ur: NEGATIVE mg/dL
Specific Gravity, Urine: 1.014 (ref 1.005–1.030)
pH: 5 (ref 5.0–8.0)

## 2018-07-05 LAB — SURGICAL PCR SCREEN
MRSA, PCR: NEGATIVE
Staphylococcus aureus: POSITIVE — AB

## 2018-07-05 LAB — TYPE AND SCREEN
ABO/RH(D): A POS
Antibody Screen: NEGATIVE

## 2018-07-05 LAB — BASIC METABOLIC PANEL
Anion gap: 9 (ref 5–15)
BUN: 15 mg/dL (ref 8–23)
CO2: 24 mmol/L (ref 22–32)
Calcium: 9.1 mg/dL (ref 8.9–10.3)
Chloride: 96 mmol/L — ABNORMAL LOW (ref 98–111)
Creatinine, Ser: 0.62 mg/dL (ref 0.44–1.00)
GFR calc Af Amer: >60 mL/min (ref >60)
GFR calc non Af Amer: >60 mL/min (ref >60)
Glucose, Bld: 96 mg/dL (ref 70–99)
Potassium: 4.1 mmol/L (ref 3.5–5.1)
Sodium: 129 mmol/L — ABNORMAL LOW (ref 135–145)

## 2018-07-05 LAB — APTT: aPTT: 29 seconds (ref 24–36)

## 2018-07-05 LAB — CBC
HCT: 38.7 % (ref 36.0–46.0)
Hemoglobin: 13 g/dL (ref 12.0–15.0)
MCH: 31.3 pg (ref 26.0–34.0)
MCHC: 33.6 g/dL (ref 30.0–36.0)
MCV: 93 fL (ref 80.0–100.0)
Platelets: 287 10*3/uL (ref 150–400)
RBC: 4.16 MIL/uL (ref 3.87–5.11)
RDW: 12.7 % (ref 11.5–15.5)
WBC: 6.4 10*3/uL (ref 4.0–10.5)
nRBC: 0 % (ref 0.0–0.2)

## 2018-07-05 LAB — SEDIMENTATION RATE: Sed Rate: 6 mm/hr (ref 0–30)

## 2018-07-05 LAB — PROTIME-INR
INR: 1.1 (ref 0.8–1.2)
Prothrombin Time: 13.7 seconds (ref 11.4–15.2)

## 2018-07-05 NOTE — Patient Instructions (Signed)
Your procedure is scheduled on: 07-13-18 TUESDAY Report to Same Day Surgery 2nd floor medical mall [facility] Entrance-take elevator on left to 2nd floor.  Check in with surgery information desk.) To find out your arrival time please call [phone] between 1PM - 3PM on 07-12-18 MONDAY  Remember: Instructions that are not followed completely may result in serious medical risk, up to and including death, or upon the discretion of your surgeon and anesthesiologist your surgery may need to be rescheduled.    _x___ 1. Do not eat food after midnight the night before your procedure. NO GUM OR CANDY AFTER MIDNIGHT.  You may drink clear liquids up to 2 hours before you are scheduled to arrive at the hospital for your procedure.  Do not drink clear liquids within 2 hours of your scheduled arrival to the hospital.  Clear liquids include  --Water or Apple juice without pulp  --Clear carbohydrate beverage such as ClearFast or Gatorade  --Black Coffee or Clear Tea (No milk, no creamers, do not add anything to the coffee or Tea   ____Ensure clear carbohydrate drink on the way to the hospital for bariatric patients  ____Ensure clear carbohydrate drink 3 hours before surgery for Dr Dwyane Luo patients if physician instructed.     __x__ 2. No Alcohol for 24 hours before or after surgery.   __x__3. No Smoking or e-cigarettes for 24 prior to surgery.  Do not use any chewable tobacco products for at least 6 hour prior to surgery   ____  4. Bring all medications with you on the day of surgery if instructed.    __x__ 5. Notify your doctor if there is any change in your medical condition     (cold, fever, infections).    x___6. On the morning of surgery brush your teeth with toothpaste and water.  You may rinse your mouth with mouth wash if you wish.  Do not swallow any toothpaste or mouthwash.   Do not wear jewelry, make-up, hairpins, clips or nail polish.  Do not wear lotions, powders, or perfumes.  You may wear deodorant.  Do not shave 48 hours prior to surgery. Men may shave face and neck.  Do not bring valuables to the hospital.    [facility] is not responsible for any belongings or valuables.               Contacts, dentures or bridgework may not be worn into surgery.  Leave your suitcase in the car. After surgery it may be brought to your room.  For patients admitted to the hospital, discharge time is determined by your treatment team.  _  Patients discharged the day of surgery will not be allowed to drive home.  You will need someone to drive you home and stay with you the night of your procedure.    Please read over the following fact sheets that you were given:   [facility] Preparing for Surgery and or MRSA Information   _x___ TAKE THE FOLLOWING MEDICATION THE MORNING OF SURGERY WITH A SMALL SIP OF WATER. These include:  1. GABAPENTIN (NEURONTIN)  2.   3.  4.  5.  6.  ____Fleets enema or Magnesium Citrate as directed.   _x___ Use CHG Soap or sage wipes as directed on instruction sheet   ____ Use inhalers on the day of surgery and bring to hospital day of surgery  ____ Stop Metformin and Janumet 2 days prior to surgery.    ____ Take 1/2 of  usual insulin dose the night before surgery and none on the morning surgery.   ____ Follow recommendations from Cardiologist, Pulmonologist or PCP regarding stopping Aspirin, Coumadin, Plavix ,Eliquis, Effient, or Pradaxa, and Pletal.  X____Stop Anti-inflammatories such as Advil, Aleve, Ibuprofen, Motrin, Naproxen, Naprosyn, Goodies powders or aspirin products NOW-OK to take Tylenol OR TRAMADOL IF NEEDED   ____ Stop supplements until after surgery.    ____ Bring C-Pap to the hospital.

## 2018-07-06 LAB — URINE CULTURE: Culture: NO GROWTH

## 2018-07-06 NOTE — Pre-Procedure Instructions (Signed)
Messaged dr Randa Lynn regarding abnormal EKG-She states if pt not having any cardiac symptoms she should be ok. Pt denied any cardiac symptoms during PAT visit

## 2018-07-09 ENCOUNTER — Other Ambulatory Visit: Payer: Self-pay

## 2018-07-09 ENCOUNTER — Other Ambulatory Visit
Admission: RE | Admit: 2018-07-09 | Discharge: 2018-07-09 | Disposition: A | Discharge status: Home / Self Care | Payer: Medicare Other | Source: Ambulatory Visit | Attending: Surgery | Admitting: Surgery

## 2018-07-09 DIAGNOSIS — Z1159 Encounter for screening for other viral diseases: Secondary | ICD-10-CM | POA: Diagnosis not present

## 2018-07-09 DIAGNOSIS — Z01812 Encounter for preprocedural laboratory examination: Secondary | ICD-10-CM | POA: Insufficient documentation

## 2018-07-10 LAB — NOVEL CORONAVIRUS, NAA (HOSP ORDER, SEND-OUT TO REF LAB; TAT 18-24 HRS): SARS-CoV-2, NAA: NOT DETECTED

## 2018-07-12 MED ORDER — CEFAZOLIN SODIUM-DEXTROSE 2-4 GM/100ML-% IV SOLN
2.0000 g | Freq: Once | INTRAVENOUS | Status: AC
  Administered 2018-07-13: 2 g via INTRAVENOUS

## 2018-07-13 ENCOUNTER — Other Ambulatory Visit: Payer: Self-pay

## 2018-07-13 ENCOUNTER — Encounter: Payer: Self-pay | Admitting: *Deleted

## 2018-07-13 ENCOUNTER — Inpatient Hospital Stay: Payer: Medicare Other | Admitting: Anesthesiology

## 2018-07-13 ENCOUNTER — Inpatient Hospital Stay
Admission: RE | Admit: 2018-07-13 | Discharge: 2018-07-16 | DRG: 470 | Disposition: A | Discharge status: Skilled Nursing Facility | Payer: Medicare Other | Source: Ambulatory Visit | Attending: Surgery | Admitting: Surgery

## 2018-07-13 ENCOUNTER — Inpatient Hospital Stay: Payer: Medicare Other

## 2018-07-13 ENCOUNTER — Encounter
Admission: RE | Disposition: A | Discharge status: Skilled Nursing Facility | Payer: Self-pay | Source: Ambulatory Visit | Attending: Surgery

## 2018-07-13 DIAGNOSIS — J9811 Atelectasis: Secondary | ICD-10-CM | POA: Diagnosis not present

## 2018-07-13 DIAGNOSIS — K219 Gastro-esophageal reflux disease without esophagitis: Secondary | ICD-10-CM | POA: Diagnosis present

## 2018-07-13 DIAGNOSIS — Z7982 Long term (current) use of aspirin: Secondary | ICD-10-CM

## 2018-07-13 DIAGNOSIS — Z974 Presence of external hearing-aid: Secondary | ICD-10-CM

## 2018-07-13 DIAGNOSIS — R5082 Postprocedural fever: Secondary | ICD-10-CM

## 2018-07-13 DIAGNOSIS — Z1159 Encounter for screening for other viral diseases: Secondary | ICD-10-CM | POA: Diagnosis not present

## 2018-07-13 DIAGNOSIS — Z79899 Other long term (current) drug therapy: Secondary | ICD-10-CM

## 2018-07-13 DIAGNOSIS — Z791 Long term (current) use of non-steroidal anti-inflammatories (NSAID): Secondary | ICD-10-CM

## 2018-07-13 DIAGNOSIS — H9190 Unspecified hearing loss, unspecified ear: Secondary | ICD-10-CM | POA: Diagnosis present

## 2018-07-13 DIAGNOSIS — M1611 Unilateral primary osteoarthritis, right hip: Secondary | ICD-10-CM | POA: Diagnosis present

## 2018-07-13 DIAGNOSIS — Z96641 Presence of right artificial hip joint: Secondary | ICD-10-CM

## 2018-07-13 HISTORY — PX: TOTAL HIP ARTHROPLASTY: SHX124

## 2018-07-13 HISTORY — DX: Unspecified hearing loss, unspecified ear: H91.90

## 2018-07-13 LAB — ABO/RH: ABO/RH(D): A POS

## 2018-07-13 SURGERY — ARTHROPLASTY, HIP, TOTAL,POSTERIOR APPROACH
Anesthesia: Spinal | Laterality: Right

## 2018-07-13 MED ORDER — PHENYLEPHRINE HCL (PRESSORS) 10 MG/ML IV SOLN
INTRAVENOUS | Status: AC
  Filled 2018-07-13: qty 1

## 2018-07-13 MED ORDER — TRANEXAMIC ACID 1000 MG/10ML IV SOLN
INTRAVENOUS | Status: DC | PRN
  Administered 2018-07-13: 1000 mg via TOPICAL

## 2018-07-13 MED ORDER — ACETAMINOPHEN 500 MG PO TABS
1000.0000 mg | ORAL_TABLET | Freq: Four times a day (QID) | ORAL | Status: AC
  Administered 2018-07-13 – 2018-07-14 (×4): 1000 mg via ORAL
  Filled 2018-07-13 (×4): qty 2

## 2018-07-13 MED ORDER — PHENYLEPHRINE HCL (PRESSORS) 10 MG/ML IV SOLN
INTRAVENOUS | Status: DC | PRN
  Administered 2018-07-13: 100 ug via INTRAVENOUS

## 2018-07-13 MED ORDER — FAMOTIDINE 20 MG PO TABS
ORAL_TABLET | ORAL | Status: AC
  Administered 2018-07-13: 20 mg via ORAL
  Filled 2018-07-13: qty 1

## 2018-07-13 MED ORDER — WOMENS MULTI GUMMIES PO CHEW: 2.0000 | CHEWABLE_TABLET | Freq: Every day | ORAL | Status: DC

## 2018-07-13 MED ORDER — MAGNESIUM HYDROXIDE 400 MG/5ML PO SUSP
30.0000 mL | Freq: Every day | ORAL | Status: DC | PRN
  Administered 2018-07-14 – 2018-07-15 (×2): 30 mL via ORAL
  Filled 2018-07-13: qty 30

## 2018-07-13 MED ORDER — GABAPENTIN 300 MG PO CAPS
600.0000 mg | ORAL_CAPSULE | Freq: Three times a day (TID) | ORAL | Status: DC
  Administered 2018-07-13 – 2018-07-16 (×9): 600 mg via ORAL
  Filled 2018-07-13 (×9): qty 2

## 2018-07-13 MED ORDER — HYDROMORPHONE HCL 1 MG/ML IJ SOLN: 0.2500 mg | INTRAMUSCULAR | Status: DC | PRN

## 2018-07-13 MED ORDER — SODIUM CHLORIDE 0.9 % IV SOLN
INTRAVENOUS | Status: DC | PRN
  Administered 2018-07-13: 50 ug/min via INTRAVENOUS

## 2018-07-13 MED ORDER — PROPOFOL 500 MG/50ML IV EMUL
INTRAVENOUS | Status: AC
  Filled 2018-07-13: qty 100

## 2018-07-13 MED ORDER — DIPHENHYDRAMINE HCL 12.5 MG/5ML PO ELIX: 12.5000 mg | ORAL_SOLUTION | ORAL | Status: DC | PRN

## 2018-07-13 MED ORDER — FENTANYL CITRATE (PF) 100 MCG/2ML IJ SOLN: 25.0000 ug | INTRAMUSCULAR | Status: DC | PRN

## 2018-07-13 MED ORDER — ONDANSETRON HCL 4 MG PO TABS: 4.0000 mg | ORAL_TABLET | Freq: Four times a day (QID) | ORAL | Status: DC | PRN

## 2018-07-13 MED ORDER — TRAMADOL HCL 50 MG PO TABS
50.0000 mg | ORAL_TABLET | Freq: Four times a day (QID) | ORAL | Status: DC | PRN
  Administered 2018-07-13 – 2018-07-14 (×2): 50 mg via ORAL
  Filled 2018-07-13 (×3): qty 1

## 2018-07-13 MED ORDER — EPHEDRINE SULFATE 50 MG/ML IJ SOLN
INTRAMUSCULAR | Status: AC
  Filled 2018-07-13: qty 1

## 2018-07-13 MED ORDER — ADULT MULTIVITAMIN W/MINERALS CH
1.0000 | ORAL_TABLET | Freq: Every day | ORAL | Status: DC
  Administered 2018-07-14 – 2018-07-16 (×3): 1 via ORAL
  Filled 2018-07-13 (×3): qty 1

## 2018-07-13 MED ORDER — BISACODYL 10 MG RE SUPP: 10.0000 mg | Freq: Every day | RECTAL | Status: DC | PRN

## 2018-07-13 MED ORDER — PROPOFOL 500 MG/50ML IV EMUL
INTRAVENOUS | Status: DC | PRN
  Administered 2018-07-13: 50 ug/kg/min via INTRAVENOUS

## 2018-07-13 MED ORDER — PROPOFOL 10 MG/ML IV BOLUS
INTRAVENOUS | Status: DC | PRN
  Administered 2018-07-13 (×2): 30 mg via INTRAVENOUS

## 2018-07-13 MED ORDER — LIDOCAINE HCL (PF) 2 % IJ SOLN
INTRAMUSCULAR | Status: AC
  Filled 2018-07-13: qty 20

## 2018-07-13 MED ORDER — OXYCODONE HCL 5 MG PO TABS
5.0000 mg | ORAL_TABLET | ORAL | Status: DC | PRN
  Administered 2018-07-15: 5 mg via ORAL
  Filled 2018-07-13: qty 1

## 2018-07-13 MED ORDER — ACETAMINOPHEN 325 MG PO TABS
325.0000 mg | ORAL_TABLET | Freq: Four times a day (QID) | ORAL | Status: DC | PRN
  Administered 2018-07-15: 325 mg via ORAL
  Filled 2018-07-13: qty 1

## 2018-07-13 MED ORDER — FAMOTIDINE 20 MG PO TABS
20.0000 mg | ORAL_TABLET | Freq: Once | ORAL | Status: AC
  Administered 2018-07-13: 07:00:00 20 mg via ORAL

## 2018-07-13 MED ORDER — SODIUM CHLORIDE 0.9 % IV SOLN
INTRAVENOUS | Status: DC
  Administered 2018-07-13 – 2018-07-15 (×3): via INTRAVENOUS

## 2018-07-13 MED ORDER — SODIUM CHLORIDE 0.9 % IV SOLN
INTRAVENOUS | Status: DC | PRN
  Administered 2018-07-13: 60 mL

## 2018-07-13 MED ORDER — ACETAMINOPHEN 10 MG/ML IV SOLN
INTRAVENOUS | Status: DC | PRN
  Administered 2018-07-13: 1000 mg via INTRAVENOUS

## 2018-07-13 MED ORDER — DOCUSATE SODIUM 100 MG PO CAPS
100.0000 mg | ORAL_CAPSULE | Freq: Two times a day (BID) | ORAL | Status: DC
  Administered 2018-07-13 – 2018-07-16 (×6): 100 mg via ORAL
  Filled 2018-07-13 (×6): qty 1

## 2018-07-13 MED ORDER — PANTOPRAZOLE SODIUM 40 MG PO TBEC
40.0000 mg | DELAYED_RELEASE_TABLET | Freq: Every day | ORAL | Status: DC
  Administered 2018-07-14 – 2018-07-16 (×3): 40 mg via ORAL
  Filled 2018-07-13 (×3): qty 1

## 2018-07-13 MED ORDER — CEFAZOLIN SODIUM-DEXTROSE 2-4 GM/100ML-% IV SOLN
INTRAVENOUS | Status: AC
  Filled 2018-07-13: qty 100

## 2018-07-13 MED ORDER — METOCLOPRAMIDE HCL 10 MG PO TABS: 5.0000 mg | ORAL_TABLET | Freq: Three times a day (TID) | ORAL | Status: DC | PRN

## 2018-07-13 MED ORDER — FENTANYL CITRATE (PF) 100 MCG/2ML IJ SOLN
INTRAMUSCULAR | Status: DC | PRN
  Administered 2018-07-13: 50 ug via INTRAVENOUS
  Administered 2018-07-13 (×2): 25 ug via INTRAVENOUS

## 2018-07-13 MED ORDER — FLEET ENEMA 7-19 GM/118ML RE ENEM: 1.0000 | ENEMA | Freq: Once | RECTAL | Status: DC | PRN

## 2018-07-13 MED ORDER — MAGNESIUM OXIDE 400 (241.3 MG) MG PO TABS
200.0000 mg | ORAL_TABLET | Freq: Every day | ORAL | Status: DC
  Administered 2018-07-14 – 2018-07-16 (×3): 200 mg via ORAL
  Filled 2018-07-13 (×4): qty 1

## 2018-07-13 MED ORDER — CEFAZOLIN SODIUM-DEXTROSE 2-4 GM/100ML-% IV SOLN
2.0000 g | Freq: Four times a day (QID) | INTRAVENOUS | Status: AC
  Administered 2018-07-13 – 2018-07-14 (×3): 2 g via INTRAVENOUS
  Filled 2018-07-13 (×3): qty 100

## 2018-07-13 MED ORDER — FENTANYL CITRATE (PF) 100 MCG/2ML IJ SOLN
INTRAMUSCULAR | Status: AC
  Filled 2018-07-13: qty 2

## 2018-07-13 MED ORDER — LACTATED RINGERS IV SOLN
INTRAVENOUS | Status: DC
  Administered 2018-07-13: 50 mL/h via INTRAVENOUS

## 2018-07-13 MED ORDER — BUPIVACAINE HCL (PF) 0.5 % IJ SOLN
INTRAMUSCULAR | Status: DC | PRN
  Administered 2018-07-13: 3 mL

## 2018-07-13 MED ORDER — BUPIVACAINE-EPINEPHRINE (PF) 0.5% -1:200000 IJ SOLN
INTRAMUSCULAR | Status: DC | PRN
  Administered 2018-07-13: 30 mL

## 2018-07-13 MED ORDER — ONDANSETRON HCL 4 MG/2ML IJ SOLN: 4.0000 mg | Freq: Once | INTRAMUSCULAR | Status: DC | PRN

## 2018-07-13 MED ORDER — ENOXAPARIN SODIUM 40 MG/0.4ML ~~LOC~~ SOLN
40.0000 mg | SUBCUTANEOUS | Status: DC
  Administered 2018-07-14 – 2018-07-16 (×3): 40 mg via SUBCUTANEOUS
  Filled 2018-07-13 (×3): qty 0.4

## 2018-07-13 MED ORDER — LIDOCAINE HCL (CARDIAC) PF 100 MG/5ML IV SOSY
PREFILLED_SYRINGE | INTRAVENOUS | Status: DC | PRN
  Administered 2018-07-13: 50 mg via INTRAVENOUS

## 2018-07-13 MED ORDER — METOCLOPRAMIDE HCL 5 MG/ML IJ SOLN: 5.0000 mg | Freq: Three times a day (TID) | INTRAMUSCULAR | Status: DC | PRN

## 2018-07-13 MED ORDER — ONDANSETRON HCL 4 MG/2ML IJ SOLN: 4.0000 mg | Freq: Four times a day (QID) | INTRAMUSCULAR | Status: DC | PRN

## 2018-07-13 SURGICAL SUPPLY — 57 items
BLADE SAGITTAL WIDE XTHICK NO (BLADE) ×3 IMPLANT
BLADE SURG SZ20 CARB STEEL (BLADE) ×3 IMPLANT
CANISTER SUCT 1200ML W/VALVE (MISCELLANEOUS) ×3 IMPLANT
CANISTER SUCT 3000ML PPV (MISCELLANEOUS) ×6 IMPLANT
CHLORAPREP W/TINT 26 (MISCELLANEOUS) ×3 IMPLANT
COVER WAND RF STERILE (DRAPES) ×3 IMPLANT
DRAPE IMP U-DRAPE 54X76 (DRAPES) ×3 IMPLANT
DRAPE INCISE IOBAN 66X60 STRL (DRAPES) ×3 IMPLANT
DRAPE SHEET LG 3/4 BI-LAMINATE (DRAPES) ×3 IMPLANT
DRAPE SURG 17X11 SM STRL (DRAPES) ×6 IMPLANT
DRSG OPSITE POSTOP 4X10 (GAUZE/BANDAGES/DRESSINGS) ×3 IMPLANT
ELECT BLADE 6.5 EXT (BLADE) ×3 IMPLANT
ELECT CAUTERY BLADE 6.4 (BLADE) ×3 IMPLANT
GLOVE BIO SURGEON STRL SZ7.5 (GLOVE) ×12 IMPLANT
GLOVE BIO SURGEON STRL SZ8 (GLOVE) ×12 IMPLANT
GLOVE BIOGEL PI IND STRL 8 (GLOVE) ×1 IMPLANT
GLOVE BIOGEL PI INDICATOR 8 (GLOVE) ×2
GLOVE INDICATOR 8.0 STRL GRN (GLOVE) ×3 IMPLANT
GOWN STRL REUS W/ TWL LRG LVL3 (GOWN DISPOSABLE) ×1 IMPLANT
GOWN STRL REUS W/ TWL XL LVL3 (GOWN DISPOSABLE) ×1 IMPLANT
GOWN STRL REUS W/TWL LRG LVL3 (GOWN DISPOSABLE) ×2
GOWN STRL REUS W/TWL XL LVL3 (GOWN DISPOSABLE) ×2
HEAD FEM -6XOFST 36XMDLR (Orthopedic Implant) IMPLANT
HEAD MODULAR 36MM (Orthopedic Implant) ×2 IMPLANT
HOOD PEEL AWAY FLYTE STAYCOOL (MISCELLANEOUS) ×9 IMPLANT
KIT TURNOVER KIT A (KITS) ×3 IMPLANT
LINER ACE G7 36 SZF HIGH WALL (Liner) ×2 IMPLANT
MAT ABSORB  FLUID 56X50 GRAY (MISCELLANEOUS) ×2
MAT ABSORB FLUID 56X50 GRAY (MISCELLANEOUS) ×1 IMPLANT
NDL FILTER BLUNT 18X1 1/2 (NEEDLE) ×1 IMPLANT
NDL SAFETY ECLIPSE 18X1.5 (NEEDLE) ×2 IMPLANT
NDL SPNL 20GX3.5 QUINCKE YW (NEEDLE) ×1 IMPLANT
NEEDLE FILTER BLUNT 18X 1/2SAF (NEEDLE) ×2
NEEDLE FILTER BLUNT 18X1 1/2 (NEEDLE) ×1 IMPLANT
NEEDLE HYPO 18GX1.5 SHARP (NEEDLE) ×4
NEEDLE SPNL 20GX3.5 QUINCKE YW (NEEDLE) ×3 IMPLANT
PACK HIP PROSTHESIS (MISCELLANEOUS) ×3 IMPLANT
PILLOW ABDUCTION FOAM SM (MISCELLANEOUS) ×3 IMPLANT
PIN STEINMAN 3/16 (PIN) ×3 IMPLANT
PULSAVAC PLUS IRRIG FAN TIP (DISPOSABLE) ×3
SHELL ACETABULAR 3H 54MM F (Shell) ×2 IMPLANT
SOL .9 NS 3000ML IRR  AL (IV SOLUTION) ×2
SOL .9 NS 3000ML IRR UROMATIC (IV SOLUTION) ×1 IMPLANT
SPONGE LAP 18X18 RF (DISPOSABLE) ×3 IMPLANT
STAPLER SKIN PROX 35W (STAPLE) ×3 IMPLANT
STEM FEMORAL FULL 15X155 ECHO (Stem) ×2 IMPLANT
SUT TICRON 2-0 30IN 311381 (SUTURE) ×9 IMPLANT
SUT VIC AB 0 CT1 36 (SUTURE) ×3 IMPLANT
SUT VIC AB 1 CT1 36 (SUTURE) ×6 IMPLANT
SUT VIC AB 2-0 CT1 (SUTURE) ×16 IMPLANT
SYR 10ML LL (SYRINGE) ×3 IMPLANT
SYR 20CC LL (SYRINGE) ×3 IMPLANT
SYR 30ML LL (SYRINGE) ×11 IMPLANT
TAPE TRANSPORE STRL 2 31045 (GAUZE/BANDAGES/DRESSINGS) ×3 IMPLANT
TIP FAN IRRIG PULSAVAC PLUS (DISPOSABLE) ×1 IMPLANT
TRAY FOLEY MTR SLVR 16FR STAT (SET/KITS/TRAYS/PACK) ×3 IMPLANT
WATER STERILE IRR 1000ML POUR (IV SOLUTION) ×3 IMPLANT

## 2018-07-13 NOTE — Anesthesia Procedure Notes (Signed)
Spinal  Patient location during procedure: OR Start time: 07/13/2018 7:42 AM End time: 07/13/2018 7:45 AM Staffing Anesthesiologist: Martha Clan, MD Resident/CRNA: Johnna Acosta, CRNA Performed: resident/CRNA  Preanesthetic Checklist Completed: patient identified, site marked, surgical consent, pre-op evaluation, timeout performed, IV checked, risks and benefits discussed and monitors and equipment checked Spinal Block Patient position: sitting Prep: ChloraPrep Patient monitoring: heart rate, continuous pulse ox, blood pressure and cardiac monitor Approach: midline Location: L3-4 Injection technique: single-shot Needle Needle type: Whitacre and Introducer  Needle gauge: 24 G Needle length: 9 cm Assessment Sensory level: T10 Additional Notes Negative paresthesia. Negative blood return. Positive free-flowing CSF. Expiration date of kit checked and confirmed. Patient tolerated procedure well, without complications.

## 2018-07-13 NOTE — Anesthesia Post-op Follow-up Note (Signed)
Anesthesia QCDR form completed.        

## 2018-07-13 NOTE — TOC Benefit Eligibility Note (Signed)
Transition of Care [facility]) Benefit Eligibility Note    Patient Details  Name: Becky Shea MRN: [id number] Date of Birth: 07/18/1931   Medication/Dose: Enoxaparin 40 mg, daily x 14 days  Covered?: Yes(Generic only - Enoxaparin)  Tier: (Tier not indicated)  Prescription Coverage Preferred Pharmacy: Any  Spoke with Person/Company/Phone Number:: Helene Kelp [employer], [phone]  Co-Pay: $347.66  Prior Approval: No  Deductible: Unmet  Additional Notes: Lovenox not on formulary    [place] Phone Number: 07/13/2018, 10:11 AM

## 2018-07-13 NOTE — Anesthesia Procedure Notes (Signed)
Date/Time: 07/13/2018 7:50 AM Performed by: Johnna Acosta, CRNA Pre-anesthesia Checklist: Patient identified, Emergency Drugs available, Suction available, Patient being monitored and Timeout performed Patient Re-evaluated:Patient Re-evaluated prior to induction Oxygen Delivery Method: Simple face mask Preoxygenation: Pre-oxygenation with 100% oxygen

## 2018-07-13 NOTE — Op Note (Signed)
07/13/2018  9:47 AM  Patient:   [facility]  Pre-Op Diagnosis:   Degenerative joint disease, right hip.  Post-Op Diagnosis:   Same.  Procedure:   Right total hip arthroplasty.  Surgeon:   Pascal Lux, MD  Assistant:   Cameron Proud, PA-C  Anesthesia:   Spinal  Findings:   As above.  Complications:   None  EBL:   250 cc  Fluids:   700 cc crystalloid  UOP:   650 cc  TT:   None  Drains:   None  Closure:   Staples  Implants:   Biomet press-fit system with a #15 laterally offset Echo femoral stem, a 54 mm acetabular shell with an E-poly hi-wall liner, and a 36 mm ceramic head with a -6 mm neck.  Brief Clinical Note:   The patient is an 85 year old female with a long history of progressively worsening right hip/groin pain. Her symptoms have progressed despite medications, activity modification, etc. Her history and examination are consistent with advanced degenerative joint disease, confirmed by plain radiographs. She presents at this time for a right total hip arthroplasty.   Procedure:   The patient was brought into the operating room. After adequate spinal anesthesia was obtained, a Foley catheter was placed by the nurse. The patient was repositioned in the left lateral decubitus position and secured using a lateral hip positioner. The right hip and lower extremity were prepped with ChloroPrep solution before being draped sterilely. Preoperative antibiotics were administered. A timeout was performed to verify the appropriate surgical site before a standard posterior approach to the hip was made through an approximately 4-5 inch incision. The incision was carried down through the subcutaneous tissues to expose the gluteal fascia and proximal end of the iliotibial band. These structures were split the length of the incision and the Charnley self-retaining hip retractor placed. The bursal tissues were swept posteriorly to expose the short external rotators. The anterior  border of the piriformis tendon was identified and this plane developed down through the capsule to enter the joint. A flap of tissue was elevated off the posterior aspect of the femoral neck and greater trochanter and retracted posteriorly. This flap included the piriformis tendon, the short external rotators, and the posterior capsule. The soft tissues were elevated off the lateral aspect of the ilium and a large Steinmann pin placed bicortically. With the right leg aligned over the left, a drill bit was placed into the greater trochanter parallel to the Steinmann pin and the distance between these two pins measured in order to optimize leg lengths postoperatively. The drill bit was removed and the hip dislocated. The piriformis fossa was debrided of soft tissues before the intramedullary canal was accessed through this point using a triple step reamer. The canal was reamed sequentially beginning with a #7 tapered reamer and progressing to a #15 tapered reamer. This provided excellent circumferential chatter. Using the appropriate guide, a femoral neck cut was made 10-12 mm above the lesser trochanter. The femoral head was removed.  Attention was directed to the acetabular side. The labrum was debrided circumferentially before the ligamentum teres was removed using a large curette. A line was drawn on the drapes corresponding to the native version of the acetabulum. This line was used as a guide while the acetabulum was reamed sequentially beginning with a 45 mm reamer and progressing to a 53 mm reamer. This provided excellent circumferential chatter. The 53 mm trial acetabulum was positioned and found to fit quite well.  Therefore, the 54 mm acetabular shell was selected and impacted into place with care taken to maintain the appropriate version. The trial high wall liner was inserted.  Attention was redirected to the femoral side. A box osteotome was used to establish version before the canal was broached  sequentially beginning with a #7 broach and progressing to a #15 broach. This was left in place and several trial reductions performed using both a standard and laterally offset neck options, as well as the -6 mm neck length. The permanent E-polyethylene hi-wall liner was impacted into the acetabular shell and its locking mechanism verified using a quarter-inch osteotome. Next, the #15 laterally offset femoral stem was impacted into place with care taken to maintain the appropriate version. A repeat trial reduction was performed using the -6 mm neck length. The -6 mm neck length demonstrated excellent stability both in extension and external rotation as well as with flexion to 90 and internal rotation beyond 70. It also was stable in the position of sleep. In addition, leg lengths appeared to be restored appropriately, both by reassessing the position of the right leg over the left, as well as by measuring the distance between the Steinmann pin and the drill bit. The 36 mm chrome cobalt head with the -6 mm neck adapter was impacted onto the stem of the femoral component. The Morse taper locking mechanism was verified using manual distraction before the head was relocated and placed through a range of motion with the findings as described above.  The wound was copiously irrigated with bacitracin saline solution via the jet lavage system before the peri-incisional and pericapsular tissues were injected with 30 cc of 0.5% Sensorcaine with epinephrine and 20 cc of Exparel diluted out to 60 cc with normal saline to help with postoperative analgesia. The posterior flap was reapproximated to the posterior aspect of the greater trochanter using #2 Tycron interrupted sutures placed through drill holes. Several additional #2 Tycron interrupted sutures were used to reinforce this layer of closure. The iliotibial band was reapproximated using #1 Vicryl interrupted sutures before the gluteal fascia was closed using a running  #1 Vicryl suture. At this point, 1 g of transexemic acid in 10 cc of normal saline was injected into the joint to help reduce postoperative bleeding. The subcutaneous tissues were closed in several layers using 2-0 Vicryl interrupted sutures before the skin was closed using staples. A sterile occlusive dressing was applied to the wound before the patient was placed into an abduction wedge pillow. The patient was then rolled back into the supine position on her hospital bed before being awakened and returned to the recovery room in satisfactory condition after tolerating the procedure well.

## 2018-07-13 NOTE — TOC Progression Note (Signed)
Transition of Care [facility]) - Progression Note    Patient Details  Name: Becky Shea MRN: [id number] Date of Birth: 07/31/1931  Transition of Care [facility]) CM/SW Contact  Su Hilt, RN Phone Number: 07/13/2018, 8:41 AM  Clinical Narrative:    Requested the price for Lovenox, will notify the patient once obtained        Expected Discharge Plan and Services                                                 Social Determinants of Health (SDOH) Interventions    Readmission Risk Interventions No flowsheet data found.

## 2018-07-13 NOTE — H&P (Signed)
Paper H&P to be scanned into permanent record. H&P reviewed and patient re-examined. No changes. 

## 2018-07-13 NOTE — Transfer of Care (Signed)
Immediate Anesthesia Transfer of Care Note  Patient: Becky Shea  Procedure(s) Performed: TOTAL HIP ARTHROPLASTY (Right )  Patient Location: PACU  Anesthesia Type:Spinal  Level of Consciousness: awake, alert  and oriented  Airway & Oxygen Therapy: Patient Spontanous Breathing and Patient connected to face mask oxygen  Post-op Assessment: Report given to RN and Post -op Vital signs reviewed and stable  Post vital signs: Reviewed and stable  Last Vitals:  Vitals Value Taken Time  BP 121/78 07/13/18 0954  Temp 36.6 C 07/13/18 0954  Pulse 73 07/13/18 0954  Resp 10 07/13/18 0954  SpO2 100 % 07/13/18 0954  Vitals shown include unvalidated device data.  Last Pain:  Vitals:   07/13/18 0655  TempSrc: Oral      Patients Stated Pain Goal: 1 (99/24/26 8341)  Complications: No apparent anesthesia complications

## 2018-07-13 NOTE — Anesthesia Preprocedure Evaluation (Signed)
Anesthesia Evaluation  Patient identified by MRN, date of birth, ID band Patient awake    Reviewed: Allergy & Precautions, H&P , NPO status , Patient's Chart, lab work & pertinent test results, reviewed documented beta blocker date and time   History of Anesthesia Complications Negative for: history of anesthetic complications  Airway Mallampati: I  TM Distance: >3 FB Neck ROM: full    Dental  (+) Poor Dentition, Dental Advidsory Given, Missing   Pulmonary neg pulmonary ROS,           Cardiovascular Exercise Tolerance: Good negative cardio ROS       Neuro/Psych negative neurological ROS  negative psych ROS   GI/Hepatic Neg liver ROS, GERD  ,  Endo/Other  negative endocrine ROS  Renal/GU negative Renal ROS  negative genitourinary   Musculoskeletal   Abdominal   Peds  Hematology negative hematology ROS (+)   Anesthesia Other Findings Past Medical History: No date: Arthritis No date: GERD (gastroesophageal reflux disease)     Comment:  rare but no meds No date: HOH (hard of hearing)     Comment:  wears bilateral hearing aides   Reproductive/Obstetrics negative OB ROS                             Anesthesia Physical Anesthesia Plan  ASA: II  Anesthesia Plan: Spinal   Post-op Pain Management:    Induction: Intravenous  PONV Risk Score and Plan: Propofol infusion and TIVA  Airway Management Planned: Natural Airway and Simple Face Mask  Additional Equipment:   Intra-op Plan:   Post-operative Plan:   Informed Consent: I have reviewed the patients History and Physical, chart, labs and discussed the procedure including the risks, benefits and alternatives for the proposed anesthesia with the patient or authorized representative who has indicated his/her understanding and acceptance.     Dental Advisory Given  Plan Discussed with: Anesthesiologist, CRNA and  Surgeon  Anesthesia Plan Comments:         Anesthesia Quick Evaluation

## 2018-07-13 NOTE — Evaluation (Signed)
Physical Therapy Evaluation Patient Details Name: Becky Shea MRN: [id number] DOB: April 11, 1931 Today's Date: 07/13/2018   History of Present Illness  Pt is 85 yo female s/p R post THA, WBAT. PMH of GERD, HOH  Clinical Impression  Patient easily woken, reported return of sensation, able to DF/PF R ankle, pain 6/10. Pt very hard of hearing and initially resistant to wearing hearing aides, but agreeable to wear to improve safety/pt understanding. Pt reported that she lives with her daughter in a one story home, independent in ADLs, dependent for cooking/cleaning, medication management, driving, uses WC for community ambulation and rollator for household ambulation.  Pt educated and provided with visual/tactile cues on bed level therapeutic exercises, AAROM for SAQ and heel slides on RLE. Mild SOB noted, spO2 >95% HR WFLs. Pt educated on posterior hip precautions as well, handout provided. Supine to sit with modA and HOB elevated, step by step instructions provided to maximize pt participation. Pt able to sit EOB with fair balance. Sit <> stand x2 this session, modA for safety, and able to stand pivot to recliner with use of RW, recliner arm rest, and modA. Pt limited by flexed trunk, decreased weightbearing in RLE as well as inability/unwilling to straighten bilateral knees.  Overall the patient demonstrated deficits (see "PT Problem List") that impede the patient's functional abilities, safety, and mobility and would benefit from skilled PT intervention. Recommendation is STR at this time pending pt progress.   Of note, the patient has very limited R shoulder mobility due to a chronic issue. The patient would benefit from an OT consult to address limitations in UE mobility, strength, and to maximize ADL performance/energy conservation.     Follow Up Recommendations SNF    Equipment Recommendations  Rolling walker with 5" wheels    Recommendations for Other Services OT consult      Precautions / Restrictions Precautions Precautions: Posterior Hip;Fall Precaution Booklet Issued: Yes (comment) Restrictions Weight Bearing Restrictions: Yes RLE Weight Bearing: Weight bearing as tolerated      Mobility  Bed Mobility Overal bed mobility: Needs Assistance Bed Mobility: Supine to Sit     Supine to sit: Mod assist;HOB elevated     General bed mobility comments: to maintain precautions, trunk elevation, LE management  Transfers Overall transfer level: Needs assistance Equipment used: Rolling walker (2 wheeled) Transfers: Sit to/from [facility] Sit to Stand: Mod assist;From elevated surface Stand pivot transfers: From elevated surface;Mod assist       General transfer comment: x2 this session, improved participation second attempt. educated on hand placement to increase safety. Mod for balance, pt with flexed trunk, decreased knee extension  Ambulation/Gait             General Gait Details: deferred due to safety concerns, able to take very small shuffled steps towards chair with RW and ModA  Stairs            Wheelchair Mobility    Modified Rankin (Stroke Patients Only)       Balance Overall balance assessment: Needs assistance Sitting-balance support: Feet supported Sitting balance-Leahy Scale: Fair       Standing balance-Leahy Scale: Poor                               Pertinent Vitals/Pain Pain Assessment: 0-10 Pain Score: 6  Pain Descriptors / Indicators: Tender;Sore Pain Intervention(s): Repositioned;Monitored during session;Premedicated before session;Limited activity within patient's tolerance    Home Living  Family/patient expects to be discharged to:: Private residence Living Arrangements: Children(daughter) Available Help at Discharge: Family;Available 24 hours/day Type of Home: House Home Access: Stairs to enter Entrance Stairs-Rails: None Entrance Stairs-Number of Steps: 2, currently  needs assist for stair navigation from person/holds onto car Home Layout: One level Home Equipment: Walker - 4 wheels;Cane - single point;Wheelchair - manual      Prior Function Level of Independence: Needs assistance   Gait / Transfers Assistance Needed: Pt able to ambulate household distances with rollator, stated that she will use her WC for community ambulation  ADL's / Homemaking Assistance Needed: reported independent with dressing/bathing, dependent for driving, meals, errands, medication        Hand Dominance   Dominant Hand: Right    Extremity/Trunk Assessment   Upper Extremity Assessment Upper Extremity Assessment: RUE deficits/detail;LUE deficits/detail;Defer to OT evaluation RUE Deficits / Details: unable to lift RUE greater than 60deg AROM LUE Deficits / Details: generalized weakness    Lower Extremity Assessment Lower Extremity Assessment: RLE deficits/detail;LLE deficits/detail RLE Deficits / Details: s/p THA LLE Deficits / Details: grossly 3+/5       Communication   Communication: HOH  Cognition Arousal/Alertness: Awake/alert Behavior During Therapy: WFL for tasks assessed/performed Overall Cognitive Status: Within Functional Limits for tasks assessed                                        General Comments      Exercises Total Joint Exercises Ankle Circles/Pumps: AROM;10 reps;Both Quad Sets: AROM;10 reps;Right;Strengthening Gluteal Sets: AROM;Both;10 reps;Strengthening Short Arc Quad: AAROM;Right;10 reps;Strengthening Heel Slides: AAROM;Right;10 reps   Assessment/Plan    PT Assessment Patient needs continued PT services  PT Problem List Decreased strength;Decreased mobility;Decreased safety awareness;Decreased range of motion;Decreased knowledge of precautions;Decreased activity tolerance;Decreased balance;Decreased knowledge of use of DME;Pain       PT Treatment Interventions DME instruction;Therapeutic exercise;Gait  training;Balance training;Stair training;Neuromuscular re-education;Functional mobility training;Therapeutic activities;Patient/family education    PT Goals (Current goals can be found in the Care Plan section)  Acute Rehab PT Goals Patient Stated Goal: to go home PT Goal Formulation: With patient Time For Goal Achievement: 07/27/18 Potential to Achieve Goals: Fair    Frequency BID   Barriers to discharge        Co-evaluation               AM-PAC PT "6 Clicks" Mobility  Outcome Measure Help needed turning from your back to your side while in a flat bed without using bedrails?: A Lot Help needed moving from lying on your back to sitting on the side of a flat bed without using bedrails?: A Lot Help needed moving to and from a bed to a chair (including a wheelchair)?: A Lot Help needed standing up from a chair using your arms (e.g., wheelchair or bedside chair)?: A Lot Help needed to walk in hospital room?: Total Help needed climbing 3-5 steps with a railing? : Total 6 Click Score: 10    End of Session Equipment Utilized During Treatment: Gait belt Activity Tolerance: Patient limited by fatigue Patient left: in chair;with call bell/phone within reach;with SCD's reapplied;with chair alarm set Nurse Communication: Mobility status PT Visit Diagnosis: Unsteadiness on feet (R26.81);Other abnormalities of gait and mobility (R26.89);Pain;Muscle weakness (generalized) (M62.81) Pain - Right/Left: Right Pain - part of body: Hip    Time: 5176-1607 PT Time Calculation (min) (ACUTE ONLY): 44 min  Charges:   PT Evaluation $PT Eval Low Complexity: 1 Low PT Treatments $Therapeutic Exercise: 8-22 mins $Therapeutic Activity: 8-22 mins       Lieutenant Diego PT, DPT [phone] PM,07/13/18 [phone]

## 2018-07-13 NOTE — OR Nursing (Signed)
Patient is very [place], even with hearing aides in place.  She needs close and loud conversation to be able to understand with masks on caregivers.  She has some confusion as to events in the past and has needed the input of her daughter to confirm her history and daily events.  If there is any possibility of allowing a family member to participate in her post op care, that would be exceptional.  Patient denies any falls recently but her daughter did find her on the floor recently.

## 2018-07-14 ENCOUNTER — Encounter: Payer: Self-pay | Admitting: Surgery

## 2018-07-14 LAB — CBC WITH DIFFERENTIAL/PLATELET
Abs Immature Granulocytes: 0.04 10*3/uL (ref 0.00–0.07)
Basophils Absolute: 0 10*3/uL (ref 0.0–0.1)
Basophils Relative: 0 %
Eosinophils Absolute: 0 10*3/uL (ref 0.0–0.5)
Eosinophils Relative: 0 %
HCT: 32.3 % — ABNORMAL LOW (ref 36.0–46.0)
Hemoglobin: 10.9 g/dL — ABNORMAL LOW (ref 12.0–15.0)
Immature Granulocytes: 0 %
Lymphocytes Relative: 18 %
Lymphs Abs: 1.9 10*3/uL (ref 0.7–4.0)
MCH: 31.4 pg (ref 26.0–34.0)
MCHC: 33.7 g/dL (ref 30.0–36.0)
MCV: 93.1 fL (ref 80.0–100.0)
Monocytes Absolute: 1.6 10*3/uL — ABNORMAL HIGH (ref 0.1–1.0)
Monocytes Relative: 15 %
Neutro Abs: 7 10*3/uL (ref 1.7–7.7)
Neutrophils Relative %: 67 %
Platelets: 208 10*3/uL (ref 150–400)
RBC: 3.47 MIL/uL — ABNORMAL LOW (ref 3.87–5.11)
RDW: 12.5 % (ref 11.5–15.5)
WBC: 10.5 10*3/uL (ref 4.0–10.5)
nRBC: 0 % (ref 0.0–0.2)

## 2018-07-14 LAB — BASIC METABOLIC PANEL
Anion gap: 9 (ref 5–15)
BUN: 10 mg/dL (ref 8–23)
CO2: 23 mmol/L (ref 22–32)
Calcium: 8.5 mg/dL — ABNORMAL LOW (ref 8.9–10.3)
Chloride: 97 mmol/L — ABNORMAL LOW (ref 98–111)
Creatinine, Ser: 0.49 mg/dL (ref 0.44–1.00)
GFR calc Af Amer: >60 mL/min (ref >60)
GFR calc non Af Amer: >60 mL/min (ref >60)
Glucose, Bld: 129 mg/dL — ABNORMAL HIGH (ref 70–99)
Potassium: 4.1 mmol/L (ref 3.5–5.1)
Sodium: 129 mmol/L — ABNORMAL LOW (ref 135–145)

## 2018-07-14 NOTE — NC FL2 (Signed)
[place] LEVEL OF CARE SCREENING TOOL     IDENTIFICATION  Patient Name: Becky Shea [facility] Birthdate: Apr 07, 1931 Sex: female Admission Date (Current Location): 07/13/2018  [facility] and [place] Number:  Engineering geologist and Address:         Provider Number: [id number]  Attending Physician Name and Address:  Corky Mull, MD  Relative Name and Phone Number:       Current Level of Care: Hospital Recommended Level of Care: [place] Prior Approval Number:    Date Approved/Denied:   PASRR Number: [id number]  Discharge Plan: SNF    Current Diagnoses: Patient Active Problem List   Diagnosis Date Noted  . Status post total hip replacement, right 07/13/2018  . Sebaceous cyst 04/09/2018  . Lumbar spondylosis 03/17/2018  . Primary osteoarthritis of right hip 03/17/2018  . Primary osteoarthritis of both knees 12/27/2014    Orientation RESPIRATION BLADDER Height & Weight     Self, Situation, Place  Normal Continent Weight: 160 lb 4.4 oz (72.7 kg) Height:  5\' 4"  (162.6 cm)  BEHAVIORAL SYMPTOMS/MOOD NEUROLOGICAL BOWEL NUTRITION STATUS      Continent Diet(Diet: Heart Health)  AMBULATORY STATUS COMMUNICATION OF NEEDS Skin   Extensive Assist Verbally Surgical wounds(Incision: Right Hip.)                       Personal Care Assistance Level of Assistance  Bathing, Feeding, Dressing Bathing Assistance: Limited assistance Feeding assistance: Independent Dressing Assistance: Limited assistance     Functional Limitations Info  Sight, Hearing, Speech Sight Info: Adequate Hearing Info: Impaired Speech Info: Adequate    SPECIAL CARE FACTORS FREQUENCY  PT (By licensed PT), OT (By licensed OT)     PT Frequency: 5 OT Frequency: 5            Contractures      Additional Factors Info  Code Status, Allergies Code Status Info: Full Code. Allergies Info: No Known Allergies.           Current Medications (07/14/2018):  This  is the current hospital active medication list Current Facility-Administered Medications  Medication Dose Route Frequency Provider Last Rate Last Dose  . 0.9 %  sodium chloride infusion   Intravenous Continuous Poggi, Marshall Cork, MD 50 mL/hr at 07/14/18 0836    . acetaminophen (TYLENOL) tablet 325-650 mg  325-650 mg Oral Q6H PRN Poggi, Marshall Cork, MD      . bisacodyl (DULCOLAX) suppository 10 mg  10 mg Rectal Daily PRN Poggi, Marshall Cork, MD      . diphenhydrAMINE (BENADRYL) 12.5 MG/5ML elixir 12.5-25 mg  12.5-25 mg Oral Q4H PRN Poggi, Marshall Cork, MD      . docusate sodium (COLACE) capsule 100 mg  100 mg Oral BID Corky Mull, MD   100 mg at 07/14/18 0837  . enoxaparin (LOVENOX) injection 40 mg  40 mg Subcutaneous Q24H Poggi, Marshall Cork, MD   40 mg at 07/14/18 8413  . gabapentin (NEURONTIN) capsule 600 mg  600 mg Oral TID Corky Mull, MD   600 mg at 07/14/18 0837  . HYDROmorphone (DILAUDID) injection 0.25-0.5 mg  0.25-0.5 mg Intravenous Q2H PRN Poggi, Marshall Cork, MD      . magnesium hydroxide (MILK OF MAGNESIA) suspension 30 mL  30 mL Oral Daily PRN Poggi, Marshall Cork, MD   30 mL at 07/14/18 0839  . magnesium oxide (MAG-OX) tablet 200 mg  200 mg Oral Daily Poggi, Marshall Cork, MD  200 mg at 07/14/18 0837  . metoCLOPramide (REGLAN) tablet 5-10 mg  5-10 mg Oral Q8H PRN Poggi, Marshall Cork, MD       Or  . metoCLOPramide (REGLAN) injection 5-10 mg  5-10 mg Intravenous Q8H PRN Poggi, Marshall Cork, MD      . multivitamin with minerals tablet 1 tablet  1 tablet Oral Daily Poggi, Marshall Cork, MD   1 tablet at 07/14/18 0837  . ondansetron (ZOFRAN) tablet 4 mg  4 mg Oral Q6H PRN Poggi, Marshall Cork, MD       Or  . ondansetron (ZOFRAN) injection 4 mg  4 mg Intravenous Q6H PRN Poggi, Marshall Cork, MD      . oxyCODONE (Oxy IR/ROXICODONE) immediate release tablet 5-10 mg  5-10 mg Oral Q4H PRN Poggi, Marshall Cork, MD      . pantoprazole (PROTONIX) EC tablet 40 mg  40 mg Oral Daily Poggi, Marshall Cork, MD   40 mg at 07/14/18 4975  . sodium phosphate (FLEET) 7-19 GM/118ML enema 1  enema  1 enema Rectal Once PRN Poggi, Marshall Cork, MD      . traMADol Veatrice Bourbon) tablet 50 mg  50 mg Oral Q6H PRN Poggi, Marshall Cork, MD   50 mg at 07/13/18 1415     Discharge Medications: Please see discharge summary for a list of discharge medications.  Relevant Imaging Results:  Relevant Lab Results:   Additional Information SSN: [id number]  Basim Bartnik, Veronia Beets, LCSW

## 2018-07-14 NOTE — Progress Notes (Signed)
PT Cancellation Note  Patient Details Name: Becky Shea MRN: [id number] DOB: [birth year]   Cancelled Treatment:    Reason Eval/Treat Not Completed: Other (comment)(Pt sleeping soundly in bed when PT entered. Pt wakes to touch and speaks, but quickly falls back asleep. Pt will re-attempt as able)   Lieutenant Diego PT, DPT 1:20 PM,07/14/18 [phone]

## 2018-07-14 NOTE — Anesthesia Postprocedure Evaluation (Signed)
Anesthesia Post Note  Patient: Becky Shea  Procedure(s) Performed: TOTAL HIP ARTHROPLASTY (Right )  Patient location during evaluation: Nursing Unit Anesthesia Type: Spinal Level of consciousness: awake and alert Pain management: satisfactory to patient Vital Signs Assessment: post-procedure vital signs reviewed and stable Respiratory status: respiratory function stable Cardiovascular status: stable Postop Assessment: no headache, no backache, spinal receding, no apparent nausea or vomiting, adequate PO intake, patient able to bend at knees and able to ambulate Anesthetic complications: no     Last Vitals:  Vitals:   07/13/18 2324 07/14/18 0625  BP: 139/71 (!) 141/79  Pulse: 97 95  Resp: 18 18  Temp: 36.8 C 37.4 C  SpO2: 94% 95%    Last Pain:  Vitals:   07/14/18 0625  TempSrc: Oral  PainSc:                  Blima Singer

## 2018-07-14 NOTE — Evaluation (Signed)
Occupational Therapy Evaluation Patient Details Name: Becky Shea MRN: [id number] DOB: [birth year] Today's Date: 07/14/2018    History of Present Illness Pt is 85 yo female s/p R post THA, WBAT. PMH of GERD, HOH   Clinical Impression   Pt. Presents with weakness, pain, limited activity tolerance, and limited functional mobility which limits her ability to complete basic ADL and IADL functioning. Pt. Resides at home alone. Pt. Was independent with ADLs, and IADL functioning. Pt. Has assist with meal preparation, driving, and IADLs.  Pt. Is very HOH, and has unable to lip read through the mask. Pt. Responded very well through written communication.  Pt. Education was provided about A/E use for LE ADLs. Pt. Could benefit from OT services for ADL training, A/E training, and pt. education about home modification, and DME. Pt. would benefit from SNF level of care upon discharge. Pt. Could benefit from follow-up OT services at discharge.    Follow Up Recommendations  SNF    Equipment Recommendations   BSCommode   Recommendations for Other Services       Precautions / Restrictions Precautions Precautions: Posterior Hip;Fall Restrictions Weight Bearing Restrictions: Yes RLE Weight Bearing: Weight bearing as tolerated      Mobility Bed Mobility           Transfers Overall transfer level: Needs assistance Equipment used: Rolling walker (2 wheeled) Transfers: Sit to/from Stand;Stand Pivot Transfers Sit to Stand: +2 physical assistance;Max assist  Mobility: Per PT           Balance Overall balance assessment: Needs assistance Sitting-balance support: Feet supported Sitting balance-Leahy Scale: Fair       Standing balance-Leahy Scale: Poor                             ADL either performed or assessed with clinical judgement   ADL Overall ADL's : Needs assistance/impaired Eating/Feeding: Independent   Grooming: Independent   Upper Body Bathing:  Minimal assistance   Lower Body Bathing: Maximal assistance   Upper Body Dressing : Minimal assistance   Lower Body Dressing: Maximal assistance                 General ADL Comments: Pt. edcuation was provided about A/E use for LE ADLs.     Vision         Perception     Praxis      Pertinent Vitals/Pain Pain Assessment: 0-10 Pain Score: 0-No pain Pain Descriptors / Indicators: Aching Pain Intervention(s): Limited activity within patient's tolerance;Monitored during session     Hand Dominance Right   Extremity/Trunk Assessment Upper Extremity Assessment Upper Extremity Assessment: Generalized weakness LUE Deficits / Details: generalized weakness           Communication Communication Communication: HOH   Cognition Arousal/Alertness: Awake/alert Behavior During Therapy: WFL for tasks assessed/performed Overall Cognitive Status: Within Functional Limits for tasks assessed                                 General Comments: HOH   General Comments       Exercises   Shoulder Instructions      Home Living Family/patient expects to be discharged to:: Private residence Living Arrangements: Children Available Help at Discharge: Family;Available 24 hours/day Type of Home: House Home Access: Stairs to enter   Entrance Stairs-Rails: None Home Layout: One level     Bathroom  Shower/Tub: Tub/shower unit         Home Equipment: Walker - 4 wheels;Cane - single point;Wheelchair - manual          Prior Functioning/Environment Level of Independence: Needs assistance    ADL's / Homemaking Assistance Needed: Independent with morning ADLS, dressing/bathing, dependent for driving, meals, errands, medication            OT Problem List: Decreased strength;Decreased coordination;Impaired UE functional use;Decreased activity tolerance;Decreased range of motion;Pain      OT Treatment/Interventions: Self-care/ADL training;Therapeutic  exercise;Neuromuscular education;DME and/or AE instruction;Therapeutic activities    OT Goals(Current goals can be found in the care plan section) Acute Rehab OT Goals Patient Stated Goal: to go home OT Goal Formulation: With patient Potential to Achieve Goals: Good  OT Frequency: Min 2X/week   Barriers to D/C:            Co-evaluation              AM-PAC OT "6 Clicks" Daily Activity     Outcome Measure Help from another person eating meals?: None Help from another person taking care of personal grooming?: A Little Help from another person toileting, which includes using toliet, bedpan, or urinal?: A Lot Help from another person bathing (including washing, rinsing, drying)?: A Lot Help from another person to put on and taking off regular upper body clothing?: A Little Help from another person to put on and taking off regular lower body clothing?: A Lot 6 Click Score: 16   End of Session    Activity Tolerance: Patient tolerated treatment well Patient left: in bed  OT Visit Diagnosis: Muscle weakness (generalized) (M62.81)                Time: 3825-0539 OT Time Calculation (min): 22 min Charges:  OT General Charges $OT Visit: 1 Visit OT Evaluation $OT Eval Low Complexity: 1 Low  Harrel Carina, MS, OTR/L  Harrel Carina 07/14/2018, 1:16 PM

## 2018-07-14 NOTE — Progress Notes (Signed)
Physical Therapy Treatment Patient Details Name: Becky Shea MRN: [id number] DOB: [birth year] Today's Date: 07/14/2018    History of Present Illness Pt is 85 yo female s/p R post THA, WBAT. PMH of GERD, HOH    PT Comments    Patient easily woken, reported 4/10 pain on the visual scale for pt understanding. PT asked pt to don hearing aides and pt agreeable. Pt with mild improvement noted in quad set quality, but still needed AAROM for all other exercises this AM. Supine to sit ModA with HOB elevated, significant assistance for RLE management. Session focused on functional activities. Transferred to [facility] with stand pivot and RW, maxAx2. Fair balance while sitting on the commode. MaxAx2 to stand, PT tech removed BSC and placed chair behind pt, maxA for eccentric control to the chair. Pt up in chair with no complaints at end of session, encouraged to remain in chair until PT returns for PM session. The patient continued to demonstrate limitations in functional activities and would benefit from skilled PT intervention.    Follow Up Recommendations  SNF     Equipment Recommendations  Rolling walker with 5" wheels    Recommendations for Other Services OT consult     Precautions / Restrictions Precautions Precautions: Posterior Hip;Fall Restrictions Weight Bearing Restrictions: Yes RLE Weight Bearing: Weight bearing as tolerated    Mobility  Bed Mobility Overal bed mobility: Needs Assistance Bed Mobility: Supine to Sit     Supine to sit: Mod assist;HOB elevated     General bed mobility comments: to maintain precautions, trunk elevation, LE management  Transfers Overall transfer level: Needs assistance Equipment used: Rolling walker (2 wheeled) Transfers: Sit to/from [employer] Transfers Sit to Stand: +2 physical assistance;Max assist         General transfer comment: Pt limited by pain this session. Difficulty with hip extension in standing, moderately flexed  trunk, decreased weight bearing in RLE.  Ambulation/Gait                 Stairs             Wheelchair Mobility    Modified Rankin (Stroke Patients Only)       Balance Overall balance assessment: Needs assistance Sitting-balance support: Feet supported Sitting balance-Leahy Scale: Fair       Standing balance-Leahy Scale: Poor                              Cognition Arousal/Alertness: Awake/alert Behavior During Therapy: WFL for tasks assessed/performed Overall Cognitive Status: Within Functional Limits for tasks assessed                                        Exercises Total Joint Exercises Ankle Circles/Pumps: AROM;10 reps;Both Quad Sets: AROM;10 reps;Right;Strengthening Gluteal Sets: AROM;Both;10 reps;Strengthening Heel Slides: AAROM;Right;10 reps Hip ABduction/ADduction: AAROM;Right;10 reps Other Exercises Other Exercises: Session focused on functional activities. Transferred to [facility] with stand pivot and RW, maxAx2. Fair balance while sitting on the commode. maxAx2 to stand, PT removed BSC and placed chair behind pt, maxA for eccentric control to the chair.    General Comments        Pertinent Vitals/Pain Pain Assessment: 0-10 Pain Score: 4  Pain Descriptors / Indicators: Tender;Sore Pain Intervention(s): Limited activity within patient's tolerance    Home Living  Prior Function            PT Goals (current goals can now be found in the care plan section) Progress towards PT goals: Progressing toward goals    Frequency    BID      PT Plan Current plan remains appropriate    Co-evaluation              AM-PAC PT "6 Clicks" Mobility   Outcome Measure  Help needed turning from your back to your side while in a flat bed without using bedrails?: A Lot Help needed moving from lying on your back to sitting on the side of a flat bed without using bedrails?: A Lot Help  needed moving to and from a bed to a chair (including a wheelchair)?: A Lot Help needed standing up from a chair using your arms (e.g., wheelchair or bedside chair)?: A Lot Help needed to walk in hospital room?: Total Help needed climbing 3-5 steps with a railing? : Total 6 Click Score: 10    End of Session Equipment Utilized During Treatment: Gait belt Activity Tolerance: Patient limited by fatigue Patient left: in chair;with call bell/phone within reach;with SCD's reapplied;with chair alarm set Nurse Communication: Mobility status PT Visit Diagnosis: Unsteadiness on feet (R26.81);Other abnormalities of gait and mobility (R26.89);Pain;Muscle weakness (generalized) (M62.81) Pain - Right/Left: Right Pain - part of body: Hip     Time: 0981-1914 PT Time Calculation (min) (ACUTE ONLY): 39 min  Charges:  $Therapeutic Exercise: 8-22 mins $Therapeutic Activity: 23-37 mins                     Lieutenant Diego PT, DPT 10:33 AM,07/14/18 [phone]

## 2018-07-14 NOTE — TOC Initial Note (Signed)
Transition of Care [facility]) - Initial/Assessment Note    Patient Details  Name: Becky Shea MRN: [id number] Date of Birth: January 23, 1932  [facility]) CM/SW Contact:    Keilyn Nadal, Lenice Llamas Phone Number: [phone]  07/14/2018, 1:51 PM  Clinical Narrative: Clinical Social Worker (CSW) received SNF consult. PT is recommending SNF. CSW attempted to meet with patient alone at bedside however she was hard of hearing and appeared confused. CSW contacted patient's 2 daughters [name] and [name] and explained the SNF process and that [facility] will have to approve SNF. Per daughters patient lives alone in [place] and her daughter [name] will be staying at patient's house until June 27th to assist patient with post op recovery. Patient's daughter [name] lives in [place] and visits patient several times per day. Daughters are agreeable to SNF search in [place]. FL2 complete and faxed out. CSW presented bed offers to patient and left list of SNF offers at bedside. CSW also contacted patient's daughter [name] and presented bed offers. Per [name] she will call CSW back with SNF choice. CSW will continue to follow and assist as needed.               Expected Discharge Plan: Skilled Nursing Facility Barriers to Discharge: Continued Medical Work up   Patient Goals and CMS Choice Patient states their goals for this hospitalization and ongoing recovery are:: Pain control. CMS Medicare.gov Compare Post Acute Care list provided to:: Patient Choice offered to / list presented to : Patient, Adult Children  Expected Discharge Plan and Services Expected Discharge Plan: [place] In-house Referral: Clinical Social Work Discharge Planning Services: CM Consult   Living arrangements for the past 2 months: [place]                                      Prior Living Arrangements/Services Living arrangements for the past 2 months: Single Family  Home Lives with:: Self Patient language and need for interpreter reviewed:: No Do you feel safe going back to the place where you live?: Yes      Need for Family Participation in Patient Care: Yes (Comment) Care giver support system in place?: Yes (comment)   Criminal Activity/Legal Involvement Pertinent to Current Situation/Hospitalization: No - Comment as needed  Activities of Daily Living Home Assistive Devices/Equipment: Walker (specify type) ADL Screening (condition at time of admission) Patient's cognitive ability adequate to safely complete daily activities?: Yes Is the patient deaf or have difficulty hearing?: Yes Does the patient have difficulty seeing, even when wearing glasses/contacts?: No Does the patient have difficulty concentrating, remembering, or making decisions?: No Patient able to express need for assistance with ADLs?: Yes Does the patient have difficulty dressing or bathing?: No Independently performs ADLs?: Yes (appropriate for developmental age) Communication: Independent Dressing (OT): Independent Grooming: Independent, Needs assistance Feeding: Independent Bathing: Needs assistance, Independent Is this a change from baseline?: Pre-admission baseline In/Out Bed: Needs assistance Is this a change from baseline?: Pre-admission baseline Walks in Home: Independent with device (comment) Does the patient have difficulty walking or climbing stairs?: Yes Weakness of Legs: Both Weakness of Arms/Hands: None  Permission Sought/Granted Permission sought to share information with : Facility Sport and exercise psychologist, Family Supports Permission granted to share information with : Yes, Verbal Permission Granted              Emotional Assessment Appearance:: Appears stated age Attitude/Demeanor/Rapport:  Guarded Affect (typically observed): Calm Orientation: : Oriented to Self, Oriented to Place, Fluctuating Orientation (Suspected and/or reported Sundowners) Alcohol  / Substance Use: Not Applicable Psych Involvement: No (comment)  Admission diagnosis:  Primary osteoarthritis of right hip Patient Active Problem List   Diagnosis Date Noted  . Status post total hip replacement, right 07/13/2018  . Sebaceous cyst 04/09/2018  . Lumbar spondylosis 03/17/2018  . Primary osteoarthritis of right hip 03/17/2018  . Primary osteoarthritis of both knees 12/27/2014   PCP:  [name], MD Pharmacy:   [id number] - [place], [place] [address] Phone: [phone] Fax: [phone]     Social Determinants of Health (SDOH) Interventions    Readmission Risk Interventions No flowsheet data found.

## 2018-07-14 NOTE — Progress Notes (Signed)
  Subjective: 1 Day Post-Op Procedure(s) (LRB): TOTAL HIP ARTHROPLASTY (Right) Patient reports pain as moderate.   Patient is well but is mildly confused, thought it was the evening when I entered the room PT and care management to assist with discharge planning.  Patient would like to return home if possible. Negative for chest pain and shortness of breath Fever: no Gastrointestinal:Negative for nausea and vomiting  Objective: Vital signs in last 24 hours: Temp:  [97.3 F (36.3 C)-99.3 F (37.4 C)] 99.3 F (37.4 C) (06/17 0625) Pulse Rate:  [62-97] 95 (06/17 0625) Resp:  [10-18] 18 (06/17 0625) BP: (95-172)/(59-102) 141/79 (06/17 0625) SpO2:  [93 %-100 %] 95 % (06/17 0625)  Intake/Output from previous day:  Intake/Output Summary (Last 24 hours) at 07/14/2018 0747 Last data filed at 07/14/2018 0625 Gross per 24 hour  Intake 1441.08 ml  Output 2000 ml  Net -558.92 ml    Intake/Output this shift: No intake/output data recorded.  Labs: Recent Labs    07/14/18 0537  HGB 10.9*   Recent Labs    07/14/18 0537  WBC 10.5  RBC 3.47*  HCT 32.3*  PLT 208   Recent Labs    07/14/18 0537  NA 129*  K 4.1  CL 97*  CO2 23  BUN 10  CREATININE 0.49  GLUCOSE 129*  CALCIUM 8.5*   No results for input(s): LABPT, INR in the last 72 hours.   EXAM General - Patient is Alert and Appropriate.  Patient thought it was dinner time, once corrected she knew where she was and what surgery she had undergone. Extremity - ABD soft Sensation intact distally Intact pulses distally Dorsiflexion/Plantar flexion intact Incision: dressing C/D/I No cellulitis present Dressing/Incision - clean, dry, no drainage Motor Function - intact, moving foot and toes well on exam.  Abdomen soft with normal BS.  Past Medical History:  Diagnosis Date  . Arthritis   . GERD (gastroesophageal reflux disease)    rare but no meds  . HOH (hard of hearing)    wears bilateral hearing aides     Assessment/Plan: 1 Day Post-Op Procedure(s) (LRB): TOTAL HIP ARTHROPLASTY (Right) Active Problems:   Status post total hip replacement, right  Estimated body mass index is 27.51 kg/m as calculated from the following:   Height as of this encounter: 5\' 4"  (1.626 m).   Weight as of this encounter: 72.7 kg. Advance diet Up with therapy D/C IV fluids when tolerating po intake.  Labs reviewed this AM.  Hg 10.9 this AM, WBC 10.5 Up with therapy today. Begin working on BM. Home on narcotics if confusion persists today.  DVT Prophylaxis - Lovenox, Foot Pumps and TED hose Weight-Bearing as tolerated to right leg  J. Cameron Proud, PA-C [facility] Orthopaedic Surgery 07/14/2018, 7:47 AM

## 2018-07-14 NOTE — Progress Notes (Signed)
Physical Therapy Treatment Patient Details Name: Becky Shea MRN: [id number] DOB: [birth year] Today's Date: 07/14/2018    History of Present Illness Pt is 85 yo female s/p R post THA, WBAT. PMH of GERD, HOH    PT Comments    Patient agreeable to exercises due to elevated pain, 10/10 R hip pain, and fatigue. Patient needed AAROM for heel slides and hip abduction, as well as verbal/tactile/ and visual cues from handout for exercise technique/form. Reported most pain with heel slides. Pt with no questions at this time, set up for lunch at end of session. The patient would continue to benefit from further skilled PT intervention to progress towards goals and return to PLOF.    Follow Up Recommendations  SNF     Equipment Recommendations  Rolling walker with 5" wheels    Recommendations for Other Services OT consult     Precautions / Restrictions Precautions Precautions: Posterior Hip;Fall Precaution Booklet Issued: Yes (comment) Restrictions Weight Bearing Restrictions: Yes RLE Weight Bearing: Weight bearing as tolerated    Mobility  Bed Mobility               General bed mobility comments: deferred due to pt fatigue and pain levels  Transfers                    Ambulation/Gait                 Stairs             Wheelchair Mobility    Modified Rankin (Stroke Patients Only)       Balance                                            Cognition Arousal/Alertness: Awake/alert Behavior During Therapy: WFL for tasks assessed/performed Overall Cognitive Status: Within Functional Limits for tasks assessed                                 General Comments: HOH      Exercises Total Joint Exercises Ankle Circles/Pumps: AROM;Both;15 reps;AAROM Quad Sets: Supine;AROM;Both;15 reps Gluteal Sets: AROM;Both;15 reps;Supine Towel Squeeze: AROM;Supine;Both;15 reps Heel Slides: AAROM;15 reps;Right Hip  ABduction/ADduction: AAROM;Right;15 reps    General Comments        Pertinent Vitals/Pain Pain Assessment: 0-10 Pain Score: 10-Worst pain ever Pain Descriptors / Indicators: Grimacing;Moaning Pain Intervention(s): Limited activity within patient's tolerance;Monitored during session;Repositioned;Patient requesting pain meds-RN notified    Home Living Family/patient expects to be discharged to:: Private residence Living Arrangements: Children Available Help at Discharge: Family;Available 24 hours/day Type of Home: House Home Access: Stairs to enter Entrance Stairs-Rails: None Home Layout: One level Home Equipment: Environmental consultant - 4 wheels;Cane - single point;Wheelchair - manual      Prior Function Level of Independence: Needs assistance    ADL's / Homemaking Assistance Needed: Independent with morning ADLS, dressing/bathing, dependent for driving, meals, errands, medication     PT Goals (current goals can now be found in the care plan section) Acute Rehab PT Goals Patient Stated Goal: to go home Progress towards PT goals: Progressing toward goals    Frequency    BID      PT Plan Current plan remains appropriate    Co-evaluation  AM-PAC PT "6 Clicks" Mobility   Outcome Measure  Help needed turning from your back to your side while in a flat bed without using bedrails?: A Lot Help needed moving from lying on your back to sitting on the side of a flat bed without using bedrails?: A Lot Help needed moving to and from a bed to a chair (including a wheelchair)?: A Lot Help needed standing up from a chair using your arms (e.g., wheelchair or bedside chair)?: A Lot Help needed to walk in hospital room?: Total Help needed climbing 3-5 steps with a railing? : Total 6 Click Score: 10    End of Session Equipment Utilized During Treatment: Gait belt Activity Tolerance: Patient limited by fatigue Patient left: in bed;with call bell/phone within reach;with bed  alarm set Nurse Communication: Mobility status PT Visit Diagnosis: Unsteadiness on feet (R26.81);Other abnormalities of gait and mobility (R26.89);Pain;Muscle weakness (generalized) (M62.81) Pain - Right/Left: Right Pain - part of body: Hip     Time: 3536-1443 PT Time Calculation (min) (ACUTE ONLY): 20 min  Charges:  $Therapeutic Exercise: 8-22 mins                     Lieutenant Diego PT, DPT 3:17 PM,07/14/18 [phone]

## 2018-07-14 NOTE — TOC Progression Note (Signed)
Transition of Care [facility]) - Progression Note    Patient Details  Name: Nala Kachel MRN: [id number] Date of Birth: [birth year]  Transition of Care [facility]) CM/SW Contact  Raegyn Renda, Lenice Llamas Phone Number: [phone]  07/14/2018, 3:06 PM  Clinical Narrative: Clinical Social Worker ([place]) received a call back from patient's daughter [name] and she chose [facility]. Per [facility] admissions coordinator at [facility] she will start [facility] SN[facility] authorization today. CSW will continue to follow and assist as needed.     Expected Discharge Plan: [place] Barriers to Discharge: Continued Medical Work up  Expected Discharge Plan and Services Expected Discharge Plan: [place] In-house Referral: Clinical Social Work Discharge Planning Services: CM Consult   Living arrangements for the past 2 months: Single Family Home                                       Social Determinants of Health (SDOH) Interventions    Readmission Risk Interventions No flowsheet data found.

## 2018-07-14 NOTE — TOC Progression Note (Signed)
Transition of Care [facility]) - Progression Note    Patient Details  Name: Yessika Otte MRN: [id number] Date of Birth: [birth year]  Transition of Care [facility]) CM/SW Contact  Yoana Staib, Lenice Llamas Phone Number: [phone]  07/14/2018, 5:08 PM  Clinical Narrative: Per [facility] admissions coordinator at [facility] SN[facility] authorization has been received. Per [employer] will require another Covid test prior to accepting patient. CSW sent MD and PA a message asking them to order the Covid test. RN aware of above.     Expected Discharge Plan: [place] Barriers to Discharge: Continued Medical Work up  Expected Discharge Plan and Services Expected Discharge Plan: [place] In-house Referral: Clinical Social Work Discharge Planning Services: CM Consult   Living arrangements for the past 2 months: Single Family Home                                       Social Determinants of Health (SDOH) Interventions    Readmission Risk Interventions No flowsheet data found.

## 2018-07-15 ENCOUNTER — Inpatient Hospital Stay: Payer: Medicare Other

## 2018-07-15 LAB — BASIC METABOLIC PANEL
Anion gap: 6 (ref 5–15)
BUN: 11 mg/dL (ref 8–23)
CO2: 23 mmol/L (ref 22–32)
Calcium: 8.6 mg/dL — ABNORMAL LOW (ref 8.9–10.3)
Chloride: 101 mmol/L (ref 98–111)
Creatinine, Ser: 0.63 mg/dL (ref 0.44–1.00)
GFR calc Af Amer: >60 mL/min (ref >60)
GFR calc non Af Amer: >60 mL/min (ref >60)
Glucose, Bld: 135 mg/dL — ABNORMAL HIGH (ref 70–99)
Potassium: 4 mmol/L (ref 3.5–5.1)
Sodium: 130 mmol/L — ABNORMAL LOW (ref 135–145)

## 2018-07-15 LAB — CBC
HCT: 30.9 % — ABNORMAL LOW (ref 36.0–46.0)
Hemoglobin: 10.5 g/dL — ABNORMAL LOW (ref 12.0–15.0)
MCH: 31.1 pg (ref 26.0–34.0)
MCHC: 34 g/dL (ref 30.0–36.0)
MCV: 91.4 fL (ref 80.0–100.0)
Platelets: 206 10*3/uL (ref 150–400)
RBC: 3.38 MIL/uL — ABNORMAL LOW (ref 3.87–5.11)
RDW: 12.8 % (ref 11.5–15.5)
WBC: 16.2 10*3/uL — ABNORMAL HIGH (ref 4.0–10.5)
nRBC: 0.1 % (ref 0.0–0.2)

## 2018-07-15 LAB — URINALYSIS, ROUTINE W REFLEX MICROSCOPIC
Bilirubin Urine: NEGATIVE
Glucose, UA: NEGATIVE mg/dL
Ketones, ur: NEGATIVE mg/dL
Leukocytes,Ua: NEGATIVE
Nitrite: NEGATIVE
Protein, ur: NEGATIVE mg/dL
Specific Gravity, Urine: 1.013 (ref 1.005–1.030)
pH: 7 (ref 5.0–8.0)

## 2018-07-15 LAB — SURGICAL PATHOLOGY

## 2018-07-15 LAB — SARS CORONAVIRUS 2 BY RT PCR (HOSPITAL ORDER, PERFORMED IN ~~LOC~~ HOSPITAL LAB): SARS Coronavirus 2: NEGATIVE

## 2018-07-15 NOTE — Progress Notes (Signed)
Physical Therapy Treatment Patient Details Name: Becky Shea MRN: [id number] DOB: March 04, 1931 Today's Date: 07/15/2018    History of Present Illness Pt is 85 yo female s/p R post THA, WBAT. PMH of GERD, HOH    PT Comments    Pt in bed.  Participated in exercises as described below.  Upon initiating OOB pt found to be on bedpan.  Did not alert me during exercises she was on it.  Assisted in removal and pt stated she would like to try commode.  To edge of bed with mod a x 2.  Stand pivot to commode with mod/max a x 2.  Medium BM noted and tech aware.  Stood with walker for care and to transfer to bed then to recliner at bedside.  Pt with difficulty taking steps during transfer and will sit before fully turning.  Remained in recliner.  SNF remains appropriate for discharge.   Follow Up Recommendations  SNF     Equipment Recommendations  Rolling walker with 5" wheels;3in1 (PT)    Recommendations for Other Services       Precautions / Restrictions Precautions Precautions: Posterior Hip;Fall Restrictions Weight Bearing Restrictions: Yes RLE Weight Bearing: Weight bearing as tolerated    Mobility  Bed Mobility Overal bed mobility: Needs Assistance Bed Mobility: Supine to Sit     Supine to sit: Mod assist;+2 for physical assistance        Transfers Overall transfer level: Needs assistance Equipment used: Rolling walker (2 wheeled) Transfers: Sit to/from [employer] Transfers Sit to Stand: +2 physical assistance;Max assist Stand pivot transfers: From elevated surface;Mod assist       General transfer comment: +2 heavy assist, difficulty moving feet for stepping/turning.  Ambulation/Gait             General Gait Details: deferred due to safety concerns, able to take very small shuffled steps towards chair with RW and ModA   Stairs             Wheelchair Mobility    Modified Rankin (Stroke Patients Only)       Balance Overall balance  assessment: Needs assistance Sitting-balance support: Feet supported Sitting balance-Leahy Scale: Fair     Standing balance support: Bilateral upper extremity supported Standing balance-Leahy Scale: Poor                              Cognition Arousal/Alertness: Awake/alert Behavior During Therapy: WFL for tasks assessed/performed Overall Cognitive Status: Within Functional Limits for tasks assessed                                 General Comments: HOH      Exercises Total Joint Exercises Ankle Circles/Pumps: AROM;Both;15 reps;AAROM Quad Sets: Supine;AROM;Both;15 reps Gluteal Sets: AROM;Both;15 reps;Supine Heel Slides: AAROM;15 reps;Right Hip ABduction/ADduction: AAROM;Right;15 reps    General Comments        Pertinent Vitals/Pain Pain Assessment: Faces Faces Pain Scale: Hurts even more Pain Descriptors / Indicators: Grimacing;Moaning Pain Intervention(s): Limited activity within patient's tolerance;Monitored during session    Home Living                      Prior Function            PT Goals (current goals can now be found in the care plan section) Progress towards PT goals: Progressing toward goals  Frequency    BID      PT Plan Current plan remains appropriate    Co-evaluation              AM-PAC PT "6 Clicks" Mobility   Outcome Measure  Help needed turning from your back to your side while in a flat bed without using bedrails?: A Lot Help needed moving from lying on your back to sitting on the side of a flat bed without using bedrails?: A Lot Help needed moving to and from a bed to a chair (including a wheelchair)?: A Lot Help needed standing up from a chair using your arms (e.g., wheelchair or bedside chair)?: A Lot Help needed to walk in hospital room?: Total Help needed climbing 3-5 steps with a railing? : Total 6 Click Score: 10    End of Session Equipment Utilized During Treatment: Gait  belt Activity Tolerance: Patient limited by fatigue;Patient limited by pain Patient left: in chair;with call bell/phone within reach;with chair alarm set Nurse Communication: Mobility status;Other (comment) Pain - Right/Left: Right Pain - part of body: Hip     Time: 0623-7628 PT Time Calculation (min) (ACUTE ONLY): 25 min  Charges:  $Therapeutic Exercise: 8-22 mins $Therapeutic Activity: 8-22 mins                     Chesley Noon, PTA 07/15/18, 11:00 AM

## 2018-07-15 NOTE — TOC Progression Note (Signed)
Transition of Care [facility]) - Progression Note    Patient Details  Name: Becky Shea MRN: [id number] Date of Birth: Jul 05, 1931  Transition of Care [facility]) CM/SW Contact  Gabriella Guile, Lenice Llamas Phone Number: [phone]  07/15/2018, 9:53 AM  Clinical Narrative: Clinical Social Worker (CSW) contacted patient's daughter [name] and made her aware that per [facility] admissions coordinator at [place] SN[facility] authorization has been received. CSW made daughter aware that patient will need another Covid test in order to go to the facility Daughter verbalized her understanding. Per RN rapid Covid test has been ordered.     Expected Discharge Plan: [place] Barriers to Discharge: Continued Medical Work up  Expected Discharge Plan and Services Expected Discharge Plan: [place] In-house Referral: Clinical Social Work Discharge Planning Services: CM Consult   Living arrangements for the past 2 months: Single Family Home                                       Social Determinants of Health (SDOH) Interventions    Readmission Risk Interventions No flowsheet data found.

## 2018-07-15 NOTE — Addendum Note (Signed)
Addendum  created 07/15/18 0810 by Allean Found, CRNA   Clinical Note Signed

## 2018-07-15 NOTE — TOC Progression Note (Signed)
Transition of Care [facility]) - Progression Note    Patient Details  Name: Becky Shea MRN: [id number] Date of Birth: [birth year]  Transition of Care [facility]) CM/SW Contact  Anguel Delapena, Lenice Llamas Phone Number: [phone]  07/15/2018, 3:51 PM  Clinical Narrative: Per MD patient will not D/C today. Plan is for patient to D/C to [facility] tomorrow pending medical clearance. [facility] SNF authorization has been received per [facility] admissions coordinator at [facility] and Covid test is negative. Patient's daughter [name] is aware of above.     Expected Discharge Plan: [place] Barriers to Discharge: Continued Medical Work up  Expected Discharge Plan and Services Expected Discharge Plan: [place] In-house Referral: Clinical Social Work Discharge Planning Services: CM Consult   Living arrangements for the past 2 months: Single Family Home                                       Social Determinants of Health (SDOH) Interventions    Readmission Risk Interventions No flowsheet data found.

## 2018-07-15 NOTE — Progress Notes (Signed)
OT Cancellation Note  Patient Details Name: Becky Shea MRN: [id number] DOB: 1931/08/30   Cancelled Treatment:    Reason Eval/Treat Not Completed: Fatigue/lethargy limiting ability to participate(Pt. sleeping with difficulty arousing to attend to, and participate in session. Will continue to monitor and interven at a later time, or date.)  Harrel Carina, MS, OTR/L 07/15/2018, 3:04 PM

## 2018-07-15 NOTE — Progress Notes (Signed)
Physical Therapy Treatment Patient Details Name: Becky Shea MRN: [id number] DOB: [birth year] Today's Date: 07/15/2018    History of Present Illness Pt is 85 yo female s/p R post THA, WBAT. PMH of GERD, HOH    PT Comments    Pt in recliner, ready to return to bed.  Stood with max a x 2 and walker and took several small steps with max encouragement and max a x 2 to bed.  Max a x 2 to return to supine.  Rolling left/right to fix bed pads with max a x 2.  Mobility remains difficult for pt at this time due to pain and weakness.  SNF remains appropriate upon discharge.   Follow Up Recommendations  SNF     Equipment Recommendations  Rolling walker with 5" wheels;3in1 (PT)    Recommendations for Other Services       Precautions / Restrictions Precautions Precautions: Posterior Hip;Fall Restrictions Weight Bearing Restrictions: Yes RLE Weight Bearing: Weight bearing as tolerated    Mobility  Bed Mobility Overal bed mobility: Needs Assistance Bed Mobility: Supine to Sit     Supine to sit: +2 for physical assistance;Max assist        Transfers Overall transfer level: Needs assistance Equipment used: Rolling walker (2 wheeled) Transfers: Sit to/from Stand Sit to Stand: +2 physical assistance;Max assist Stand pivot transfers: From elevated surface;Max assist       General transfer comment: +2 heavy assist, difficulty moving feet for stepping/turning.  Ambulation/Gait Ambulation/Gait assistance: Max assist;+2 physical assistance Gait Distance (Feet): 3 Feet Assistive device: Rolling walker (2 wheeled) Gait Pattern/deviations: Step-to pattern;Trunk flexed;Decreased step length - right;Decreased step length - left Gait velocity: decreased   General Gait Details: poor posture,   Stairs             Wheelchair Mobility    Modified Rankin (Stroke Patients Only)       Balance Overall balance assessment: Needs assistance Sitting-balance support: Feet  supported Sitting balance-Leahy Scale: Fair     Standing balance support: Bilateral upper extremity supported Standing balance-Leahy Scale: Poor                              Cognition Arousal/Alertness: Awake/alert Behavior During Therapy: WFL for tasks assessed/performed Overall Cognitive Status: Within Functional Limits for tasks assessed                                 General Comments: HOH      Exercises Total Joint Exercises Ankle Circles/Pumps: AROM;Both;15 reps;AAROM Quad Sets: Supine;AROM;Both;15 reps Gluteal Sets: AROM;Both;15 reps;Supine Heel Slides: AAROM;15 reps;Right Hip ABduction/ADduction: AAROM;Right;15 reps    General Comments        Pertinent Vitals/Pain Pain Assessment: Faces Faces Pain Scale: Hurts even more Pain Descriptors / Indicators: Grimacing;Moaning Pain Intervention(s): Limited activity within patient's tolerance;Repositioned    Home Living                      Prior Function            PT Goals (current goals can now be found in the care plan section) Progress towards PT goals: Progressing toward goals    Frequency    BID      PT Plan Current plan remains appropriate    Co-evaluation              AM-PAC PT "  6 Clicks" Mobility   Outcome Measure  Help needed turning from your back to your side while in a flat bed without using bedrails?: A Lot Help needed moving from lying on your back to sitting on the side of a flat bed without using bedrails?: Total Help needed moving to and from a bed to a chair (including a wheelchair)?: A Lot Help needed standing up from a chair using your arms (e.g., wheelchair or bedside chair)?: A Lot Help needed to walk in hospital room?: Total Help needed climbing 3-5 steps with a railing? : Total 6 Click Score: 9    End of Session Equipment Utilized During Treatment: Gait belt Activity Tolerance: Patient limited by fatigue;Patient limited by  pain Patient left: in bed;with call bell/phone within reach;with bed alarm set Nurse Communication: Mobility status;Other (comment) Pain - Right/Left: Right Pain - part of body: Hip     Time: 0127-0144 PT Time Calculation (min) (ACUTE ONLY): 17 min  Charges:  $Therapeutic Exercise: 8-22 mins $Therapeutic Activity: 8-22 mins                     Chesley Noon, PTA 07/15/18, 2:06 PM

## 2018-07-15 NOTE — Progress Notes (Signed)
  Subjective: 2 Days Post-Op Procedure(s) (LRB): TOTAL HIP ARTHROPLASTY (Right) Patient reports pain as moderate.   Patient is well, less confusion but hard of hearing Current plan is for discharge to SNF tomorrow. Negative for chest pain and shortness of breath Fever: 99.1 recently, WBC did increase to 16.2 Gastrointestinal:Negative for nausea and vomiting  Objective: Vital signs in last 24 hours: Temp:  [98.2 F (36.8 C)-99.1 F (37.3 C)] 99.1 F (37.3 C) (06/18 1026) Pulse Rate:  [92-110] 92 (06/18 1236) Resp:  [17] 17 (06/18 1026) BP: (122-127)/(63-77) 122/73 (06/18 1236) SpO2:  [92 %-96 %] 96 % (06/18 1236)  Intake/Output from previous day:  Intake/Output Summary (Last 24 hours) at 07/15/2018 1610 Last data filed at 07/15/2018 1300 Gross per 24 hour  Intake 240 ml  Output -  Net 240 ml    Intake/Output this shift: Total I/O In: 120 [P.O.:120] Out: -   Labs: Recent Labs    07/14/18 0537 07/15/18 0432  HGB 10.9* 10.5*   Recent Labs    07/14/18 0537 07/15/18 0432  WBC 10.5 16.2*  RBC 3.47* 3.38*  HCT 32.3* 30.9*  PLT 208 206   Recent Labs    07/14/18 0537 07/15/18 0432  NA 129* 130*  K 4.1 4.0  CL 97* 101  CO2 23 23  BUN 10 11  CREATININE 0.49 0.63  GLUCOSE 129* 135*  CALCIUM 8.5* 8.6*   No results for input(s): LABPT, INR in the last 72 hours.   EXAM General - Patient is Alert and Appropriate. Extremity - ABD soft Sensation intact distally Intact pulses distally Dorsiflexion/Plantar flexion intact Incision: dressing C/D/I No cellulitis present Dressing/Incision - Minimal bloody drainage. Motor Function - intact, moving foot and toes well on exam.  Abdomen soft with normal BS.  Past Medical History:  Diagnosis Date  . Arthritis   . GERD (gastroesophageal reflux disease)    rare but no meds  . HOH (hard of hearing)    wears bilateral hearing aides    Assessment/Plan: 2 Days Post-Op Procedure(s) (LRB): TOTAL HIP ARTHROPLASTY  (Right) Active Problems:   Status post total hip replacement, right  Estimated body mass index is 27.51 kg/m as calculated from the following:   Height as of this encounter: 5\' 4"  (1.626 m).   Weight as of this encounter: 72.7 kg. Advance diet   Labs reviewed this AM, WBC 16.2 this morning. UA negative for UTI, CXR demonstrated some mild atelectasis. Continue with therapy today. Patient has had a BM. CBC and BMP ordered for tomorrow morning. Plan for discharge to SNF tomorrow.  DVT Prophylaxis - Lovenox, Foot Pumps and TED hose Weight-Bearing as tolerated to right leg  J. Cameron Proud, PA-C [facility] 07/15/2018, 4:10 PM

## 2018-07-15 NOTE — Anesthesia Postprocedure Evaluation (Signed)
Anesthesia Post Note  Patient: Becky Shea  Procedure(s) Performed: TOTAL HIP ARTHROPLASTY (Right )  Patient location during evaluation: Nursing Unit Anesthesia Type: Spinal Level of consciousness: awake and oriented Pain management: pain level controlled Vital Signs Assessment: post-procedure vital signs reviewed and stable Respiratory status: spontaneous breathing and nonlabored ventilation Cardiovascular status: blood pressure returned to baseline and stable Postop Assessment: no headache Anesthetic complications: no     Last Vitals:  Vitals:   07/14/18 1608 07/15/18 0040  BP: (!) 143/82 127/77  Pulse: (!) 101 (!) 110  Resp: 16 17  Temp: 36.9 C 36.8 C  SpO2: 96% 92%    Last Pain:  Vitals:   07/14/18 1608  TempSrc: Oral  PainSc:                  Buckner [place]

## 2018-07-16 LAB — CBC
HCT: 29.8 % — ABNORMAL LOW (ref 36.0–46.0)
Hemoglobin: 9.8 g/dL — ABNORMAL LOW (ref 12.0–15.0)
MCH: 30.8 pg (ref 26.0–34.0)
MCHC: 32.9 g/dL (ref 30.0–36.0)
MCV: 93.7 fL (ref 80.0–100.0)
Platelets: 212 10*3/uL (ref 150–400)
RBC: 3.18 MIL/uL — ABNORMAL LOW (ref 3.87–5.11)
RDW: 12.7 % (ref 11.5–15.5)
WBC: 15.9 10*3/uL — ABNORMAL HIGH (ref 4.0–10.5)
nRBC: 0 % (ref 0.0–0.2)

## 2018-07-16 LAB — BASIC METABOLIC PANEL
Anion gap: 6 (ref 5–15)
BUN: 18 mg/dL (ref 8–23)
CO2: 25 mmol/L (ref 22–32)
Calcium: 8.5 mg/dL — ABNORMAL LOW (ref 8.9–10.3)
Chloride: 102 mmol/L (ref 98–111)
Creatinine, Ser: 0.6 mg/dL (ref 0.44–1.00)
GFR calc Af Amer: >60 mL/min (ref >60)
GFR calc non Af Amer: >60 mL/min (ref >60)
Glucose, Bld: 110 mg/dL — ABNORMAL HIGH (ref 70–99)
Potassium: 4.1 mmol/L (ref 3.5–5.1)
Sodium: 133 mmol/L — ABNORMAL LOW (ref 135–145)

## 2018-07-16 MED ORDER — ENOXAPARIN SODIUM 40 MG/0.4ML ~~LOC~~ SOLN: 40.0000 mg | SUBCUTANEOUS | 0 refills | Status: DC

## 2018-07-16 MED ORDER — OXYCODONE HCL 5 MG PO TABS: 5.0000 mg | ORAL_TABLET | ORAL | 0 refills | Status: DC | PRN

## 2018-07-16 MED ORDER — TRAMADOL HCL 50 MG PO TABS: 50.0000 mg | ORAL_TABLET | Freq: Three times a day (TID) | ORAL | 0 refills | Status: DC | PRN

## 2018-07-16 NOTE — Progress Notes (Signed)
Physical Therapy Treatment Patient Details Name: Becky Shea MRN: [id number] DOB: [birth year] Today's Date: 07/16/2018    History of Present Illness Pt is 85 yo female s/p R post THA, WBAT. PMH of GERD, HOH    PT Comments    Attempted x 2 this am.  Pt talking to family on phone each attempt.  Returned in pm and pt found to bed inc large amount of urine despite pur wick in place.  Max A +2/dependant rolling left/rigth for care.  Participated in exercises as described below.  Further OOB mobility deferred as pt awaiting transport to SNF.   Follow Up Recommendations  SNF     Equipment Recommendations  Rolling walker with 5" wheels;3in1 (PT)    Recommendations for Other Services       Precautions / Restrictions Precautions Precautions: Posterior Hip;Fall Restrictions Weight Bearing Restrictions: Yes RLE Weight Bearing: Weight bearing as tolerated    Mobility  Bed Mobility Overal bed mobility: Needs Assistance Bed Mobility: Rolling Rolling: Max assist;Total assist;+2 for physical assistance         General bed mobility comments: resists rolling left and right for bed change  Transfers                 General transfer comment: deferred as pt awaiting transport to SNF  Ambulation/Gait                 Stairs             Wheelchair Mobility    Modified Rankin (Stroke Patients Only)       Balance                                            Cognition Arousal/Alertness: Awake/alert Behavior During Therapy: WFL for tasks assessed/performed Overall Cognitive Status: Within Functional Limits for tasks assessed                                 General Comments: HOH      Exercises Other Exercises Other Exercises: pt inc large mat of urine - pur wick in place.  Tech in to assist with care and linnen change - max/dependant for rolling Other Exercises: BLE AA/PROM ble heel slides, ab/add, SLR and ankle  pumps Other Exercises: assisted with battery change for hearing aids.    General Comments        Pertinent Vitals/Pain Pain Assessment: Faces Faces Pain Scale: Hurts whole lot Pain Location: with mobility Pain Descriptors / Indicators: Grimacing;Moaning;Guarding    Home Living                      Prior Function            PT Goals (current goals can now be found in the care plan section) Progress towards PT goals: Progressing toward goals    Frequency    BID      PT Plan Current plan remains appropriate    Co-evaluation              AM-PAC PT "6 Clicks" Mobility   Outcome Measure  Help needed turning from your back to your side while in a flat bed without using bedrails?: Total Help needed moving from lying on your back to sitting on the side of a flat bed  without using bedrails?: Total Help needed moving to and from a bed to a chair (including a wheelchair)?: A Lot Help needed standing up from a chair using your arms (e.g., wheelchair or bedside chair)?: A Lot Help needed to walk in hospital room?: Total Help needed climbing 3-5 steps with a railing? : Total 6 Click Score: 8    End of Session Equipment Utilized During Treatment: Gait belt Activity Tolerance: Patient limited by fatigue;Patient limited by pain Patient left: in bed;with call bell/phone within reach;with bed alarm set   Pain - Right/Left: Right Pain - part of body: Hip     Time: 1350-1409 PT Time Calculation (min) (ACUTE ONLY): 19 min  Charges:  $Therapeutic Exercise: 8-22 mins                    Chesley Noon, PTA 07/16/18, 2:59 PM

## 2018-07-16 NOTE — Discharge Summary (Addendum)
Physician Discharge Summary  Patient ID: Becky Shea MRN: [id number] DOB/AGE: [birth year] 85 y.o.  Admit date: 07/13/2018 Discharge date: 07/16/2018  Admission Diagnoses:  Primary osteoarthritis of right hip  Discharge Diagnoses: Patient Active Problem List   Diagnosis Date Noted  . Status post total hip replacement, right 07/13/2018  . Sebaceous cyst 04/09/2018  . Lumbar spondylosis 03/17/2018  . Primary osteoarthritis of right hip 03/17/2018  . Primary osteoarthritis of both knees 12/27/2014    Past Medical History:  Diagnosis Date  . Arthritis   . GERD (gastroesophageal reflux disease)    rare but no meds  . HOH (hard of hearing)    wears bilateral hearing aides   Transfusion: None.   Consultants (if any):   Discharged Condition: Improved  Hospital Course: Becky Shea is an 85 y.o. female who was admitted 07/13/2018 with a diagnosis of primary osteoarthritis of the right hip and went to the operating room on 07/13/2018 and underwent the above named procedures.    Surgeries: Procedure(s): TOTAL HIP ARTHROPLASTY on 07/13/2018 Patient tolerated the surgery well. Taken to PACU where she was stabilized and then transferred to the orthopedic floor.  Started on Lovenox 40 q 24 hrs. Foot pumps applied bilaterally at 80 mm. Heels elevated on bed with rolled towels. No evidence of DVT. Negative Homan. Physical therapy started on day #1 for gait training and transfer. OT started day #1 for ADL and assisted devices.  Pt did have increased in WBC up to 16 on POD2.  UA did not demonstrate any signs of infection, CXR no evidence for pneumonia but did demonstrate some mild atelectasis.  WBC did decrease on POD3.  Patient's IV was removed on POD3.  Foley was removed on POD1.  Implants: Biomet press-fit system with a #15 laterally offset Echo femoral stem, a 54 mm acetabular shell with an E-poly hi-wall liner, and a 36 mm ceramic head with a -6 mm neck.  She was  given perioperative antibiotics:  Anti-infectives (From admission, onward)   Start     Dose/Rate Route Frequency Ordered Stop   07/13/18 1300  ceFAZolin (ANCEF) IVPB 2g/100 mL premix     2 g 200 mL/hr over 30 Minutes Intravenous Every 6 hours 07/13/18 1059 07/14/18 0035   07/13/18 0615  ceFAZolin (ANCEF) 2-4 GM/100ML-% IVPB    Note to Pharmacy: Cleatis Polka   : cabinet override      07/13/18 0615 07/13/18 0804   07/13/18 0000  ceFAZolin (ANCEF) IVPB 2g/100 mL premix     2 g 200 mL/hr over 30 Minutes Intravenous  Once 07/12/18 2359 07/13/18 0819    .  She was given sequential compression devices, early ambulation, and Lovenox for DVT prophylaxis.  She benefited maximally from the hospital stay and there were no complications.    Recent vital signs:  Vitals:   07/15/18 2323 07/16/18 0807  BP: 101/63 124/71  Pulse: (!) 108 98  Resp: 17 17  Temp: 98.7 F (37.1 C) 98.6 F (37 C)  SpO2: 93% 94%   Recent laboratory studies:  Lab Results  Component Value Date   HGB 9.8 (L) 07/16/2018   HGB 10.5 (L) 07/15/2018   HGB 10.9 (L) 07/14/2018   Lab Results  Component Value Date   WBC 15.9 (H) 07/16/2018   PLT 212 07/16/2018   Lab Results  Component Value Date   INR 1.1 07/05/2018   Lab Results  Component Value Date   NA 133 (L) 07/16/2018   K 4.1 07/16/2018  CL 102 07/16/2018   CO2 25 07/16/2018   BUN 18 07/16/2018   CREATININE 0.60 07/16/2018   GLUCOSE 110 (H) 07/16/2018   Discharge Medications:   Allergies as of 07/16/2018   No Known Allergies     Medication List    TAKE these medications   acetaminophen 500 MG tablet Commonly known as: TYLENOL Take 1,000 mg by mouth every 6 (six) hours as needed for moderate pain.   AZO-CRANBERRY PO Take 1 tablet by mouth daily.   BIOFLEX PO Take 1 tablet by mouth daily.   enoxaparin 40 MG/0.4ML injection Commonly known as: LOVENOX Inject 0.4 mLs (40 mg total) into the skin daily.   gabapentin 300 MG  capsule Commonly known as: NEURONTIN Take 600 mg by mouth 3 (three) times daily.   ibuprofen 200 MG tablet Commonly known as: ADVIL Take 600 mg by mouth every 6 (six) hours as needed for moderate pain.   MAGNESIUM PO Take 1 tablet by mouth daily.   oxyCODONE 5 MG immediate release tablet Commonly known as: Oxy IR/ROXICODONE Take 1-2 tablets (5-10 mg total) by mouth every 4 (four) hours as needed for moderate pain.   PAIN RELIEF ROLL-ON EX Apply 1 application topically daily as needed (pain).   traMADol 50 MG tablet Commonly known as: ULTRAM Take 1 tablet (50 mg total) by mouth every 8 (eight) hours as needed for moderate pain. What changed:   when to take this  reasons to take this   TURMERIC PO Take 1 tablet by mouth daily.   Womens Multi Gummies Chew Chew 2 each by mouth daily.            Durable Medical Equipment  (From admission, onward)         Start     Ordered   07/13/18 1100  DME Bedside commode  Once    Question:  Patient needs a bedside commode to treat with the following condition  Answer:  Status post total hip replacement, right   07/13/18 1059   07/13/18 1100  DME 3 n 1  Once     07/13/18 1059   07/13/18 1100  DME Walker rolling  Once    Question:  Patient needs a walker to treat with the following condition  Answer:  Status post total hip replacement, right   07/13/18 1059         Diagnostic Studies: Dg Chest 2 View  Result Date: 07/15/2018 CLINICAL DATA:  Postop fever. RIGHT total hip arthroplasty on 07/13/2018 EXAM: CHEST - 2 VIEW COMPARISON:  12/26/2015 FINDINGS: UPPER limits normal heart size noted. Mild bibasilar atelectasis identified. There is no evidence of focal airspace disease, pulmonary edema, suspicious pulmonary nodule/mass, pleural effusion, or pneumothorax. No acute bony abnormalities are identified. IMPRESSION: Mild bibasilar atelectasis without other significant abnormality. Electronically Signed   By: Margarette Canada M.D.   On:  07/15/2018 09:48   Dg Hip Unilat W Or W/o Pelvis 2-3 Views Right  Result Date: 07/13/2018 CLINICAL DATA:  Right hip replacement. EXAM: DG HIP (WITH OR WITHOUT PELVIS) 2-3V RIGHT COMPARISON:  No recent. FINDINGS: Total right hip replacement. Hardware intact. Anatomic alignment. No acute bony abnormality. IMPRESSION: Total right hip replacement anatomic alignment. Electronically Signed   By: Marcello Moores  Register   On: 07/13/2018 12:45   Disposition: Discharge disposition: 03-Skilled [place] is for discharge to SNF today. Pending progress with PT.   Contact information for follow-up providers    Lattie Corns, PA-C  On 07/27/2018.   Specialty: Physician Assistant Why: [email or web address] [email or web address] 012 Contact information: [place] [address] [phone]            Contact information for after-discharge care    [facility] .   Service: Skilled Nursing Contact information: [place] [phone]                 Signed: Judson Roch PA-C 07/16/2018, 1:24 PM

## 2018-07-16 NOTE — Care Management Important Message (Signed)
Important Message  Patient Details  Name: Becky Shea MRN: [id number] Date of Birth: 18-Oct-1931   Medicare Important Message Given:  Yes     Dannette Barbara 07/16/2018, 10:21 AM

## 2018-07-16 NOTE — Discharge Instructions (Signed)
Instructions after Total Hip Replacement     J. Jeffrey Poggi, M.D.  J. Lance Joya Willmott, PA-C     Dept. of Orthopaedics & Sports Medicine  [facility]  [address]  [place], [place]  [address]  Phone: [phone]   Fax: [phone]    DIET: . Drink plenty of non-alcoholic fluids. . Resume your normal diet. Include foods high in fiber.  ACTIVITY:  . You may use crutches or a walker with weight-bearing as tolerated, unless instructed otherwise. . You may be weaned off of the walker or crutches by your Physical Therapist.  . Do NOT reach below the level of your knees or cross your legs until allowed.    . Continue doing gentle exercises. Exercising will reduce the pain and swelling, increase motion, and prevent muscle weakness.   . Please continue to use the TED compression stockings for 6 weeks. You may remove the stockings at night, but should reapply them in the morning. . Do not drive or operate any equipment until instructed.  WOUND CARE:  . Continue to use ice packs periodically to reduce pain and swelling. . Keep the incision clean and dry. . You may bathe or shower after the staples are removed at the first office visit following surgery.  MEDICATIONS: . You may resume your regular medications. . Please take the pain medication as prescribed on the medication. . Do not take pain medication on an empty stomach. . You have been given a prescription for a blood thinner to prevent blood clots. Please take the medication as instructed. (NOTE: After completing a 2 week course of Lovenox, take one Enteric-coated aspirin once a day.) . Pain medications and iron supplements can cause constipation. Use a stool softener (Senokot or Colace) on a daily basis and a laxative (dulcolax or miralax) as needed. . Do not drive or drink alcoholic beverages when taking pain medications.  CALL THE OFFICE FOR: . Temperature above 101 degrees . Excessive bleeding or drainage on the  dressing. . Excessive swelling, coldness, or paleness of the toes. . Persistent nausea and vomiting.  FOLLOW-UP:  . You should have an appointment to return to the office in 2 weeks after surgery. . Arrangements have been made for continuation of Physical Therapy (either home therapy or outpatient therapy).  

## 2018-07-16 NOTE — Progress Notes (Signed)
  Subjective: 3 Days Post-Op Procedure(s) (LRB): TOTAL HIP ARTHROPLASTY (Right) Patient reports pain as mild.   Patient is well but is hard of hearing. Current plan is for discharge to SNF today. Negative for chest pain and shortness of breath Fever: 99.3 recently Gastrointestinal:Negative for nausea and vomiting  Objective: Vital signs in last 24 hours: Temp:  [98.7 F (37.1 C)-99.3 F (37.4 C)] 98.7 F (37.1 C) (06/18 2323) Pulse Rate:  [92-108] 108 (06/18 2323) Resp:  [17] 17 (06/18 2323) BP: (101-148)/(63-73) 101/63 (06/18 2323) SpO2:  [93 %-96 %] 93 % (06/18 2323)  Intake/Output from previous day:  Intake/Output Summary (Last 24 hours) at 07/16/2018 0744 Last data filed at 07/15/2018 1900 Gross per 24 hour  Intake 360 ml  Output -  Net 360 ml    Intake/Output this shift: No intake/output data recorded.  Labs: Recent Labs    07/14/18 0537 07/15/18 0432 07/16/18 0620  HGB 10.9* 10.5* 9.8*   Recent Labs    07/15/18 0432 07/16/18 0620  WBC 16.2* 15.9*  RBC 3.38* 3.18*  HCT 30.9* 29.8*  PLT 206 212   Recent Labs    07/15/18 0432 07/16/18 0620  NA 130* 133*  K 4.0 4.1  CL 101 102  CO2 23 25  BUN 11 18  CREATININE 0.63 0.60  GLUCOSE 135* 110*  CALCIUM 8.6* 8.5*   No results for input(s): LABPT, INR in the last 72 hours.   EXAM General - Patient is Alert and Appropriate. Extremity - ABD soft Sensation intact distally Intact pulses distally Dorsiflexion/Plantar flexion intact Incision: dressing C/D/I No cellulitis present Dressing/Incision - Minimal bloody drainage. Motor Function - intact, moving foot and toes well on exam.  Abdomen soft with normal BS.  Past Medical History:  Diagnosis Date  . Arthritis   . GERD (gastroesophageal reflux disease)    rare but no meds  . HOH (hard of hearing)    wears bilateral hearing aides    Assessment/Plan: 3 Days Post-Op Procedure(s) (LRB): TOTAL HIP ARTHROPLASTY (Right) Active Problems:  Status post total hip replacement, right  Estimated body mass index is 27.51 kg/m as calculated from the following:   Height as of this encounter: 5\' 4"  (1.626 m).   Weight as of this encounter: 72.7 kg. Advance diet   Labs reviewed this AM, WBC 15.9 this morning. UA negative for UTI, CXR demonstrated some mild atelectasis, likely the cause of the increased WBC. Encouraged incentive spirometer. Continue with therapy today. Patient has had a BM. Plan for discharge to SNF today.  DVT Prophylaxis - Lovenox, Foot Pumps and TED hose Weight-Bearing as tolerated to right leg  J. Cameron Proud, PA-C [facility] Orthopaedic Surgery 07/16/2018, 7:44 AM

## 2018-07-16 NOTE — Progress Notes (Addendum)
Report called to [facility] at [facility]. Patient going to room 27B. IV removed. DC instructions printed and hard copies of Rx included in packet. Patient's belongings packed by NT.

## 2018-07-16 NOTE — TOC Transition Note (Addendum)
Transition of Care [facility]) - CM/SW Discharge Note   Patient Details  Name: Becky Shea MRN: [id number] Date of Birth: 1931-10-17  [facility] CM/SW Contact:  Becky Shea, Becky Shea Phone Number: [phone]  07/16/2018, 9:12 AM   Clinical Narrative: Patient is medically stable for D/C to [facility] today. Per [facility] admissions coordinator at [facility] SNF authorization has been received and patient can come today to room 27-B. Patient's covid test was negative on 6/18. RN will call report and arrange EMS for transport. Clinical Education officer, museum (CSW) sent D/C orders to [facility]. Patient is aware of above. CSW contacted patient's daughter Becky Shea and made her aware of above. Please reconsult if future social work needs arise. CSW signing off.     Final next level of care: Skilled Nursing Facility Barriers to Discharge: Barriers Resolved   Patient Goals and CMS Choice Patient states their goals for this hospitalization and ongoing recovery are:: To improve mobility CMS Medicare.gov Compare Post Acute Care list provided to:: Patient Choice offered to / list presented to : Patient  Discharge Placement PASRR number recieved: 07/14/18            Patient chooses bed at: [facility] Patient to be transferred to facility by: [facility] EMS Name of family member notified: Patient's daughter Becky Shea is aware of D/C today. Patient and family notified of of transfer: 07/16/18  Discharge Plan and Services In-house Referral: Clinical Social Work Discharge Planning Services: CM Consult                                 Social Determinants of Health (SDOH) Interventions     Readmission Risk Interventions No flowsheet data found.

## 2018-07-16 NOTE — Progress Notes (Signed)
EMS called

## 2018-08-09 ENCOUNTER — Ambulatory Visit: Payer: Medicare Other | Admitting: Family Medicine

## 2018-08-24 ENCOUNTER — Other Ambulatory Visit: Payer: Self-pay

## 2018-08-24 ENCOUNTER — Encounter: Payer: Self-pay | Admitting: Family Medicine

## 2018-08-24 ENCOUNTER — Ambulatory Visit (INDEPENDENT_AMBULATORY_CARE_PROVIDER_SITE_OTHER): Payer: Medicare Other | Admitting: Family Medicine

## 2018-08-24 VITALS — BP 138/70 | HR 80 | Ht 64.0 in | Wt 160.0 lb

## 2018-08-24 DIAGNOSIS — N3001 Acute cystitis with hematuria: Secondary | ICD-10-CM | POA: Diagnosis not present

## 2018-08-24 LAB — POCT URINALYSIS DIPSTICK
Bilirubin, UA: NEGATIVE
Glucose, UA: NEGATIVE
Ketones, UA: NEGATIVE
Nitrite, UA: NEGATIVE
Protein, UA: NEGATIVE
Spec Grav, UA: 1.015 (ref 1.010–1.025)
Urobilinogen, UA: 0.2 E.U./dL
pH, UA: 5 (ref 5.0–8.0)

## 2018-08-24 MED ORDER — CEPHALEXIN 500 MG PO CAPS: 500.0000 mg | ORAL_CAPSULE | Freq: Two times a day (BID) | ORAL | 0 refills | Status: DC

## 2018-08-24 NOTE — Progress Notes (Signed)
Date:  08/24/2018   Name:  Becky Shea [facility]   DOB:  03/23/1931   MRN:  [id number]   Chief Complaint: Follow-up (hip replacement) and Urinary Tract Infection  Urinary Tract Infection  This is a new problem. The current episode started in the past 7 days. The problem occurs intermittently. The quality of the pain is described as burning. The pain is mild. Pertinent negatives include no chills, discharge, flank pain, frequency, hematuria, hesitancy, nausea, sweats, urgency or vomiting. She has tried antibiotics for the symptoms. The treatment provided mild relief. There is no history of catheterization, kidney stones, recurrent UTIs, a single kidney, urinary stasis or a urological procedure.    Review of Systems  Constitutional: Negative for chills.  Gastrointestinal: Negative for nausea and vomiting.  Genitourinary: Negative for flank pain, frequency, hematuria, hesitancy and urgency.    Patient Active Problem List   Diagnosis Date Noted  . Status post total hip replacement, right 07/13/2018  . Sebaceous cyst 04/09/2018  . Lumbar spondylosis 03/17/2018  . Primary osteoarthritis of right hip 03/17/2018  . Primary osteoarthritis of both knees 12/27/2014    No Known Allergies  Past Surgical History:  Procedure Laterality Date  . NO PAST SURGERIES    . TOTAL HIP ARTHROPLASTY Right 07/13/2018   Procedure: TOTAL HIP ARTHROPLASTY;  Surgeon: Corky Mull, MD;  Location: [facility] ORS;  Service: Orthopedics;  Laterality: Right;    Social History   Tobacco Use  . Smoking status: Never Smoker  . Smokeless tobacco: Never Used  Substance Use Topics  . Alcohol use: Yes    Comment: wine at night  . Drug use: No     Medication list has been reviewed and updated.  Current Meds  Medication Sig  . acetaminophen (TYLENOL) 500 MG tablet Take 1,000 mg by mouth every 6 (six) hours as needed for moderate pain.   . AZO-CRANBERRY PO Take 1 tablet by mouth daily.  [name] gabapentin (NEURONTIN)  300 MG capsule Take 600 mg by mouth 3 (three) times daily.   [name] MAGNESIUM PO Take 1 tablet by mouth daily.  . Multiple Vitamins-Minerals (WOMENS MULTI GUMMIES) CHEW Chew 2 each by mouth daily.   . traMADol (ULTRAM) 50 MG tablet Take 1 tablet (50 mg total) by mouth every 8 (eight) hours as needed for moderate pain.    PHQ 2/9 Scores 04/09/2018  PHQ - 2 Score 0  PHQ- 9 Score 0    BP Readings from Last 3 Encounters:  08/24/18 138/70  07/16/18 (!) 152/65  07/05/18 (!) 182/87    Physical Exam  Wt Readings from Last 3 Encounters:  08/24/18 160 lb (72.6 kg)  07/13/18 160 lb 4.4 oz (72.7 kg)  07/05/18 160 lb 5 oz (72.7 kg)    BP 138/70   Pulse 80   Ht 5\' 4"  (1.626 m)   Wt 160 lb (72.6 kg)   BMI 27.46 kg/m   Assessment and Plan:  1. Acute cystitis with hematuria Patient with recent discharge from rehab.  She was treated earlier in rehabilitation for UTI.  Patient does have some symptoms of dysuria as well as frequency and and urgency.  Will check a urinalysis which noted hematuria and leukocytes of 3+ nature.  Will initiate cephalexin 500 mg twice a day for 3 days and await sensitivities. - cephALEXin (KEFLEX) 500 MG capsule; Take 1 capsule (500 mg total) by mouth 2 (two) times daily.  Dispense: 6 capsule; Refill: 0 - POCT urinalysis dipstick - Urine Culture

## 2018-08-26 LAB — URINE CULTURE

## 2018-08-26 LAB — SPECIMEN STATUS REPORT

## 2018-08-27 ENCOUNTER — Telehealth: Payer: Self-pay

## 2018-08-27 NOTE — Telephone Encounter (Signed)
Amy Pope called from [place] wanting verbal orders on nursing care and baths from aide- was given the okay for those and was advised to call Poggi for anything to do with the hip or pain- she voiced understanding. [phone]

## 2018-10-22 DIAGNOSIS — Z85820 Personal history of malignant melanoma of skin: Secondary | ICD-10-CM | POA: Insufficient documentation

## 2018-10-22 DIAGNOSIS — C434 Malignant melanoma of scalp and neck: Secondary | ICD-10-CM | POA: Diagnosis not present

## 2018-12-27 DIAGNOSIS — M7581 Other shoulder lesions, right shoulder: Secondary | ICD-10-CM | POA: Insufficient documentation

## 2019-03-01 ENCOUNTER — Ambulatory Visit (INDEPENDENT_AMBULATORY_CARE_PROVIDER_SITE_OTHER): Payer: Medicare HMO | Admitting: Family Medicine

## 2019-03-01 ENCOUNTER — Other Ambulatory Visit: Payer: Self-pay

## 2019-03-01 ENCOUNTER — Encounter: Payer: Self-pay | Admitting: Family Medicine

## 2019-03-01 VITALS — BP 136/78 | HR 60 | Temp 98.6°F | Ht 64.0 in | Wt 149.0 lb

## 2019-03-01 DIAGNOSIS — K1121 Acute sialoadenitis: Secondary | ICD-10-CM | POA: Diagnosis not present

## 2019-03-01 MED ORDER — AMOXICILLIN-POT CLAVULANATE 875-125 MG PO TABS: 1.0000 | ORAL_TABLET | Freq: Two times a day (BID) | ORAL | 0 refills | Status: DC

## 2019-03-01 NOTE — Progress Notes (Signed)
Date:  03/01/2019   Name:  Becky Shea [facility]   DOB:  1931-09-09   MRN:  [id number]   Chief Complaint: Neck Pain (Right neck is swollen/ into jaw x 3 days)  Neck Pain  This is a recurrent problem. The current episode started yesterday. The problem occurs constantly. The problem has been gradually worsening. The patient is experiencing no pain. Nothing aggravates the symptoms. Pertinent negatives include no chest pain, fever, headaches, leg pain, numbness, pain with swallowing, paresis, photophobia, syncope, tingling, trouble swallowing, visual change, weakness or weight loss. She has tried NSAIDs for the symptoms.    Lab Results  Component Value Date   CREATININE 0.60 07/16/2018   BUN 18 07/16/2018   NA 133 (L) 07/16/2018   K 4.1 07/16/2018   CL 102 07/16/2018   CO2 25 07/16/2018   Lab Results  Component Value Date   CHOL 140 07/23/2011   HDL 40 07/23/2011   LDLCALC 89 07/23/2011   TRIG 56 07/23/2011   No results found for: TSH Lab Results  Component Value Date   HGBA1C 5.7 07/23/2011     Review of Systems  Constitutional: Negative.  Negative for chills, fatigue, fever, unexpected weight change and weight loss.  HENT: Negative for congestion, ear discharge, ear pain, rhinorrhea, sinus pressure, sneezing, sore throat and trouble swallowing.   Eyes: Negative for photophobia, pain, discharge, redness and itching.  Respiratory: Negative for cough, shortness of breath, wheezing and stridor.   Cardiovascular: Negative for chest pain and syncope.  Gastrointestinal: Negative for abdominal pain, blood in stool, constipation, diarrhea, nausea and vomiting.  Endocrine: Negative for cold intolerance, heat intolerance, polydipsia, polyphagia and polyuria.  Genitourinary: Negative for dysuria, flank pain, frequency, hematuria, menstrual problem, pelvic pain, urgency, vaginal bleeding and vaginal discharge.  Musculoskeletal: Positive for neck pain. Negative for arthralgias, back pain  and myalgias.  Skin: Negative for rash.  Allergic/Immunologic: Negative for environmental allergies and food allergies.  Neurological: Negative for dizziness, tingling, weakness, light-headedness, numbness and headaches.  Hematological: Negative for adenopathy. Does not bruise/bleed easily.  Psychiatric/Behavioral: Negative for dysphoric mood. The patient is not nervous/anxious.     Patient Active Problem List   Diagnosis Date Noted  . Status post total hip replacement, right 07/13/2018  . Sebaceous cyst 04/09/2018  . Lumbar spondylosis 03/17/2018  . Primary osteoarthritis of right hip 03/17/2018  . Primary osteoarthritis of both knees 12/27/2014    No Known Allergies  Past Surgical History:  Procedure Laterality Date  . NO PAST SURGERIES    . TOTAL HIP ARTHROPLASTY Right 07/13/2018   Procedure: TOTAL HIP ARTHROPLASTY;  Surgeon: Corky Mull, MD;  Location: [facility] ORS;  Service: Orthopedics;  Laterality: Right;    Social History   Tobacco Use  . Smoking status: Never Smoker  . Smokeless tobacco: Never Used  Substance Use Topics  . Alcohol use: Yes    Comment: wine at night  . Drug use: No     Medication list has been reviewed and updated.  Current Meds  Medication Sig  . acetaminophen (TYLENOL) 500 MG tablet Take 1,000 mg by mouth every 6 (six) hours as needed for moderate pain.   . AZO-CRANBERRY PO Take 1 tablet by mouth daily.  [name] Bioflavonoid Products (BIOFLEX PO) Take 1 tablet by mouth daily.  [name] gabapentin (NEURONTIN) 300 MG capsule Take 300 mg by mouth 3 (three) times daily.   [name] MAGNESIUM PO Take 1 tablet by mouth daily.  . Melatonin 3 MG TABS Take  1 tablet by mouth at bedtime.  . Multiple Vitamins-Minerals (WOMENS MULTI GUMMIES) CHEW Chew 2 each by mouth daily.   . vitamin B-12 (CYANOCOBALAMIN) 1000 MCG tablet Take 1,000 mcg by mouth daily.    PHQ 2/9 Scores 04/09/2018  PHQ - 2 Score 0  PHQ- 9 Score 0    BP Readings from Last 3 Encounters:  03/01/19 136/78    08/24/18 138/70  07/16/18 (!) 152/65    Physical Exam Vitals and nursing note reviewed.  Constitutional:      General: She is not in acute distress.    Appearance: She is not diaphoretic.  HENT:     Head: Normocephalic and atraumatic.     Right Ear: External ear normal.     Left Ear: External ear normal.     Nose: Nose normal.     Right Turbinates: Not enlarged.     Left Turbinates: Not enlarged.     Right Sinus: No maxillary sinus tenderness or frontal sinus tenderness.     Left Sinus: No maxillary sinus tenderness or frontal sinus tenderness.     Mouth/Throat:     Lips: Pink.     Mouth: Mucous membranes are moist.     Dentition: Gingival swelling and dental caries present.     Pharynx: Oropharynx is clear. Uvula midline.     Tonsils: No tonsillar exudate or tonsillar abscesses.     Comments: Swelling parotid/or submandibular salavary Eyes:     General:        Right eye: No discharge.        Left eye: No discharge.     Conjunctiva/sclera: Conjunctivae normal.     Pupils: Pupils are equal, round, and reactive to light.  Neck:     Thyroid: No thyromegaly.     Vascular: No JVD.  Cardiovascular:     Rate and Rhythm: Normal rate and regular rhythm.     Heart sounds: Normal heart sounds. No murmur. No friction rub. No gallop.   Pulmonary:     Effort: Pulmonary effort is normal.     Breath sounds: Normal breath sounds.  Abdominal:     General: Bowel sounds are normal.     Palpations: Abdomen is soft. There is no mass.     Tenderness: There is no abdominal tenderness. There is no guarding.  Musculoskeletal:        General: Normal range of motion.     Cervical back: Normal range of motion and neck supple.  Lymphadenopathy:     Cervical: No cervical adenopathy.  Skin:    General: Skin is warm and dry.  Neurological:     Mental Status: She is alert.     Deep Tendon Reflexes: Reflexes are normal and symmetric.     Wt Readings from Last 3 Encounters:  03/01/19 149 lb  (67.6 kg)  08/24/18 160 lb (72.6 kg)  07/13/18 160 lb 4.4 oz (72.7 kg)    BP 136/78   Pulse 60   Temp 98.6 F (37 C) (Oral)   Ht 5\' 4"  (1.626 m)   Wt 149 lb (67.6 kg)   BMI 25.58 kg/m   Assessment and Plan: 1. Parotitis, acute Acute.  Patient was noted to have a swelling beneath her right mandible.  It has since resolved.  There is no palpable adenopathy nor palpable mass.  I believe it is probably a parotid circumstance either a duct that is occluded by a stone or may be a parotitis.  Patient's been encouraged to suck  on candy and to initiate Augmentin 875 mg twice a day.  Patient is to return a call if it does not resolve or continues.  Next that would be ENT evaluation - amoxicillin-clavulanate (AUGMENTIN) 875-125 MG tablet; Take 1 tablet by mouth 2 (two) times daily.  Dispense: 20 tablet; Refill: 0

## 2019-03-01 NOTE — Patient Instructions (Signed)
Parotitis  Parotitis is inflammation of one or both of your parotid glands. These glands produce saliva. They are found on each side of your face, below and in front of your earlobes. The saliva that they produce comes out of tiny openings (ducts) inside your cheeks. Parotitis may cause sudden swelling and pain (acute parotitis). It can also cause repeated episodes of swelling and pain or continued swelling that may or may not be painful (chronic parotitis). What are the causes? This condition may be caused by:  Infections from bacteria.  Infections from viruses, such as mumps or HIV.  Blockage (obstruction) of saliva flow through the parotid glands. This can be from a stone, scar tissue, or a tumor.  Diseases that cause your body's defense system (immune system) to attack healthy cells in your salivary glands. These are called autoimmune diseases. What increases the risk? You are more likely to develop this condition if:  You are 50 years old or older.  You do not drink enough fluids (are dehydrated).  You drink too much alcohol.  You have: ? A dry mouth. ? Poor dental hygiene. ? Diabetes. ? Gout. ? A long-term illness.  You have had radiation treatments to the head and neck.  You take certain medicines. What are the signs or symptoms? Symptoms of this condition depend on the cause. Symptoms may include:  Swelling under and in front of the ear. This may get worse after eating.  Redness of the skin over the parotid gland.  Pain and tenderness over the parotid gland. This may get worse after eating.  Fever or chills.  Pus coming from the ducts inside the mouth.  Dry mouth.  A bad taste in the mouth. How is this diagnosed? This condition may be diagnosed based on:  Your medical history.  A physical exam.  Tests to find the cause of the parotitis. These may include: ? Doing blood tests to check for an autoimmune disease or infections from a virus. ? Taking a  fluid sample from the parotid gland and testing it for infection. ? Injecting the ducts of the parotid gland with a dye and then taking X-rays (sialogram). ? Having other imaging tests of the gland, such as X-rays, ultrasound, MRI, or CT scan. ? Checking the opening of the gland for a stone or obstruction. ? Placing a needle into the gland to remove tissue for a biopsy (fine needle aspiration). How is this treated? Treatment for this condition depends on the cause. Treatment may include:  Antibiotic medicine for a bacterial infection.  Drinking more fluids.  Removing a stone or obstruction.  Treating an underlying disease that is causing parotitis.  Surgery to drain an infection, remove a growth, or remove the whole gland (parotidectomy). Treatment may not be needed if parotid swelling goes away with home care. Follow these instructions at home: Medicines   Take over-the-counter and prescription medicines only as told by your health care provider.  If you were prescribed an antibiotic medicine, take it as told by your health care provider. Do not stop taking the antibiotic even if you start to feel better. Managing pain and swelling  If directed, apply heat to the affected area as often as told by your health care provider. Use the heat source that your health care provider recommends, such as a moist heat pack or a heating pad. To apply the heat: ? Place a towel between your skin and the heat source. ? Leave the heat on for 20-30 minutes. ?   Remove the heat if your skin turns bright red. This is especially important if you are unable to feel pain, heat, or cold. You may have a greater risk of getting burned.  Gargle with a salt-water mixture 3-4 times a day or as needed. To make a salt-water mixture, completely dissolve -1 tsp (3-6 g) of salt in 1 cup (237 mL) of warm water.  Gently massage the parotid glands as told by your health care provider. General instructions   Drink  enough fluid to keep your urine pale yellow.  Keep your mouth clean and moist.  Try sucking on sour candy. This may help to make your mouth less dry by stimulating the flow of saliva.  Maintain good oral health. ? Brush your teeth at least two times a day. ? Floss your teeth every day. ? See your dentist regularly.  Do not use any products that contain nicotine or tobacco, such as cigarettes, e-cigarettes, and chewing tobacco. If you need help quitting, ask your health care provider.  Do not drink alcohol.  Keep all follow-up visits as told by your health care provider. This is important. Contact a health care provider if:  You have a fever or chills.  You have new symptoms.  Your symptoms get worse.  Your symptoms do not improve with treatment. Get help right away if:  You have difficulty breathing or swallowing because of the swollen gland. Summary  Parotitis is inflammation of one or both of your parotid glands.  Symptoms include pain and swelling under and in front of the ear. They may also include a fever and a bad taste in your mouth.  This condition may be treated with antibiotics, increasing fluids, or surgery.  In some cases, parotitis may go away on its own without treatment.  You should drink plenty of fluids, maintain good oral hygiene, and avoid tobacco products. This information is not intended to replace advice given to you by your health care provider. Make sure you discuss any questions you have with your health care provider. Document Revised: 08/11/2017 Document Reviewed: 08/11/2017 Elsevier Patient Education  2020 Elsevier Inc.  

## 2019-03-07 DIAGNOSIS — M25552 Pain in left hip: Secondary | ICD-10-CM | POA: Diagnosis not present

## 2019-03-07 DIAGNOSIS — M25561 Pain in right knee: Secondary | ICD-10-CM | POA: Diagnosis not present

## 2019-03-07 DIAGNOSIS — M25551 Pain in right hip: Secondary | ICD-10-CM | POA: Diagnosis not present

## 2019-03-07 DIAGNOSIS — M25562 Pain in left knee: Secondary | ICD-10-CM | POA: Diagnosis not present

## 2019-03-07 DIAGNOSIS — M545 Low back pain: Secondary | ICD-10-CM | POA: Diagnosis not present

## 2019-03-07 DIAGNOSIS — M5417 Radiculopathy, lumbosacral region: Secondary | ICD-10-CM | POA: Diagnosis not present

## 2019-03-07 DIAGNOSIS — R262 Difficulty in walking, not elsewhere classified: Secondary | ICD-10-CM | POA: Diagnosis not present

## 2019-03-09 DIAGNOSIS — M25562 Pain in left knee: Secondary | ICD-10-CM | POA: Diagnosis not present

## 2019-03-09 DIAGNOSIS — M25552 Pain in left hip: Secondary | ICD-10-CM | POA: Diagnosis not present

## 2019-03-09 DIAGNOSIS — R262 Difficulty in walking, not elsewhere classified: Secondary | ICD-10-CM | POA: Diagnosis not present

## 2019-03-09 DIAGNOSIS — M545 Low back pain: Secondary | ICD-10-CM | POA: Diagnosis not present

## 2019-03-09 DIAGNOSIS — M5417 Radiculopathy, lumbosacral region: Secondary | ICD-10-CM | POA: Diagnosis not present

## 2019-03-09 DIAGNOSIS — M25551 Pain in right hip: Secondary | ICD-10-CM | POA: Diagnosis not present

## 2019-03-09 DIAGNOSIS — M25561 Pain in right knee: Secondary | ICD-10-CM | POA: Diagnosis not present

## 2019-03-16 DIAGNOSIS — M545 Low back pain: Secondary | ICD-10-CM | POA: Diagnosis not present

## 2019-03-16 DIAGNOSIS — M5417 Radiculopathy, lumbosacral region: Secondary | ICD-10-CM | POA: Diagnosis not present

## 2019-03-16 DIAGNOSIS — M25551 Pain in right hip: Secondary | ICD-10-CM | POA: Diagnosis not present

## 2019-03-16 DIAGNOSIS — M25552 Pain in left hip: Secondary | ICD-10-CM | POA: Diagnosis not present

## 2019-03-16 DIAGNOSIS — M25562 Pain in left knee: Secondary | ICD-10-CM | POA: Diagnosis not present

## 2019-03-16 DIAGNOSIS — R262 Difficulty in walking, not elsewhere classified: Secondary | ICD-10-CM | POA: Diagnosis not present

## 2019-03-16 DIAGNOSIS — M25561 Pain in right knee: Secondary | ICD-10-CM | POA: Diagnosis not present

## 2019-03-18 DIAGNOSIS — R262 Difficulty in walking, not elsewhere classified: Secondary | ICD-10-CM | POA: Diagnosis not present

## 2019-03-18 DIAGNOSIS — M25561 Pain in right knee: Secondary | ICD-10-CM | POA: Diagnosis not present

## 2019-03-18 DIAGNOSIS — M545 Low back pain: Secondary | ICD-10-CM | POA: Diagnosis not present

## 2019-03-18 DIAGNOSIS — M25551 Pain in right hip: Secondary | ICD-10-CM | POA: Diagnosis not present

## 2019-03-18 DIAGNOSIS — M5417 Radiculopathy, lumbosacral region: Secondary | ICD-10-CM | POA: Diagnosis not present

## 2019-03-18 DIAGNOSIS — M25552 Pain in left hip: Secondary | ICD-10-CM | POA: Diagnosis not present

## 2019-03-18 DIAGNOSIS — M25562 Pain in left knee: Secondary | ICD-10-CM | POA: Diagnosis not present

## 2019-03-21 DIAGNOSIS — M25552 Pain in left hip: Secondary | ICD-10-CM | POA: Diagnosis not present

## 2019-03-21 DIAGNOSIS — R262 Difficulty in walking, not elsewhere classified: Secondary | ICD-10-CM | POA: Diagnosis not present

## 2019-03-21 DIAGNOSIS — M545 Low back pain: Secondary | ICD-10-CM | POA: Diagnosis not present

## 2019-03-21 DIAGNOSIS — M25561 Pain in right knee: Secondary | ICD-10-CM | POA: Diagnosis not present

## 2019-03-21 DIAGNOSIS — M5417 Radiculopathy, lumbosacral region: Secondary | ICD-10-CM | POA: Diagnosis not present

## 2019-03-21 DIAGNOSIS — M25562 Pain in left knee: Secondary | ICD-10-CM | POA: Diagnosis not present

## 2019-03-21 DIAGNOSIS — M25551 Pain in right hip: Secondary | ICD-10-CM | POA: Diagnosis not present

## 2019-03-22 DIAGNOSIS — H524 Presbyopia: Secondary | ICD-10-CM | POA: Diagnosis not present

## 2019-03-23 DIAGNOSIS — M25552 Pain in left hip: Secondary | ICD-10-CM | POA: Diagnosis not present

## 2019-03-23 DIAGNOSIS — R262 Difficulty in walking, not elsewhere classified: Secondary | ICD-10-CM | POA: Diagnosis not present

## 2019-03-23 DIAGNOSIS — M5417 Radiculopathy, lumbosacral region: Secondary | ICD-10-CM | POA: Diagnosis not present

## 2019-03-23 DIAGNOSIS — M545 Low back pain: Secondary | ICD-10-CM | POA: Diagnosis not present

## 2019-03-23 DIAGNOSIS — M25562 Pain in left knee: Secondary | ICD-10-CM | POA: Diagnosis not present

## 2019-03-23 DIAGNOSIS — M25561 Pain in right knee: Secondary | ICD-10-CM | POA: Diagnosis not present

## 2019-03-23 DIAGNOSIS — M25551 Pain in right hip: Secondary | ICD-10-CM | POA: Diagnosis not present

## 2019-03-27 DIAGNOSIS — M1611 Unilateral primary osteoarthritis, right hip: Secondary | ICD-10-CM | POA: Diagnosis not present

## 2019-04-18 DIAGNOSIS — H2511 Age-related nuclear cataract, right eye: Secondary | ICD-10-CM | POA: Diagnosis not present

## 2019-04-18 DIAGNOSIS — H353131 Nonexudative age-related macular degeneration, bilateral, early dry stage: Secondary | ICD-10-CM | POA: Diagnosis not present

## 2019-04-18 DIAGNOSIS — Z01818 Encounter for other preprocedural examination: Secondary | ICD-10-CM | POA: Diagnosis not present

## 2019-04-18 DIAGNOSIS — H25812 Combined forms of age-related cataract, left eye: Secondary | ICD-10-CM | POA: Diagnosis not present

## 2019-04-18 DIAGNOSIS — H2513 Age-related nuclear cataract, bilateral: Secondary | ICD-10-CM | POA: Diagnosis not present

## 2019-04-18 DIAGNOSIS — H2512 Age-related nuclear cataract, left eye: Secondary | ICD-10-CM | POA: Diagnosis not present

## 2019-04-24 DIAGNOSIS — M1611 Unilateral primary osteoarthritis, right hip: Secondary | ICD-10-CM | POA: Diagnosis not present

## 2019-05-02 DIAGNOSIS — H2511 Age-related nuclear cataract, right eye: Secondary | ICD-10-CM | POA: Diagnosis not present

## 2019-05-04 ENCOUNTER — Ambulatory Visit (INDEPENDENT_AMBULATORY_CARE_PROVIDER_SITE_OTHER): Payer: Medicare HMO

## 2019-05-04 VITALS — Ht 64.0 in | Wt 146.0 lb

## 2019-05-04 DIAGNOSIS — Z0000 Encounter for general adult medical examination without abnormal findings: Secondary | ICD-10-CM | POA: Diagnosis not present

## 2019-05-04 DIAGNOSIS — Z780 Asymptomatic menopausal state: Secondary | ICD-10-CM | POA: Diagnosis not present

## 2019-05-04 NOTE — Progress Notes (Signed)
Subjective:   Becky Shea is a 86 y.o. female who presents for an Initial Medicare Annual Wellness Visit.  Virtual Visit via Telephone Note  I connected with Becky Shea on 05/04/19 at  9:20 AM EDT by telephone and verified that I am speaking with the correct person using two identifiers.  Medicare Annual Wellness visit completed telephonically due to Covid-19 pandemic.   Location: Patient: home Provider: office   I discussed the limitations, risks, security and privacy concerns of performing an evaluation and management service by telephone and the availability of in person appointments. The patient expressed understanding and agreed to proceed.  Some vital signs may be absent or patient reported.   Clemetine Marker, LPN    Review of Systems      Cardiac Risk Factors include: advanced age (>57men, >62 women)     Objective:    Today's Vitals   05/04/19 0922  Weight: 146 lb (66.2 kg)  Height: 5\' 4"  (1.626 m)   Body mass index is 25.06 kg/m.  Advanced Directives 05/04/2019 07/13/2018 07/13/2018 02/08/2018 10/13/2016  Does Patient Have a Medical Advance Directive? Yes Yes Yes No Yes  Type of Paramedic of [place];Living will [place];Living will [place] - -  Does patient want to make changes to medical advance directive? - No - Patient declined No - Patient declined - -  Copy of [place] in Chart? No - copy requested No - copy requested No - copy requested - -    Current Medications (verified) Outpatient Encounter Medications as of 05/04/2019  Medication Sig  . acetaminophen (TYLENOL) 500 MG tablet Take 1,000 mg by mouth every 6 (Becky) hours as needed for moderate pain.   [name] gabapentin (NEURONTIN) 300 MG capsule Take 300 mg by mouth 3 (three) times daily.   [name] MAGNESIUM PO Take 1 tablet by mouth daily.  . Melatonin 3 MG TABS Take 1 tablet by mouth at bedtime.  . Multiple  Vitamins-Minerals (WOMENS MULTI GUMMIES) CHEW Chew 2 each by mouth daily.   . TURMERIC PO Take by mouth.  . vitamin B-12 (CYANOCOBALAMIN) 1000 MCG tablet Take 1,000 mcg by mouth daily.  . [DISCONTINUED] amoxicillin-clavulanate (AUGMENTIN) 875-125 MG tablet Take 1 tablet by mouth 2 (two) times daily.  . [DISCONTINUED] AZO-CRANBERRY PO Take 1 tablet by mouth daily.  . [DISCONTINUED] Bioflavonoid Products (BIOFLEX PO) Take 1 tablet by mouth daily.   No facility-administered encounter medications on file as of 05/04/2019.    Allergies (verified) Patient has no known allergies.   History: Past Medical History:  Diagnosis Date  . Arthritis   . GERD (gastroesophageal reflux disease)    rare but no meds  . HOH (hard of hearing)    wears bilateral hearing aides   Past Surgical History:  Procedure Laterality Date  . CATARACT EXTRACTION, BILATERAL    . NO PAST SURGERIES    . TOTAL HIP ARTHROPLASTY Right 07/13/2018   Procedure: TOTAL HIP ARTHROPLASTY;  Surgeon: Corky Mull, MD;  Location: [facility] ORS;  Service: Orthopedics;  Laterality: Right;   History reviewed. No pertinent family history. Social History   Socioeconomic History  . Marital status: Widowed    Spouse name: Not on file  . Number of children: Not on file  . Years of education: Not on file  . Highest education level: Not on file  Occupational History  . 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  Smoking status: Never Smoker  . Smokeless tobacco: Never Used  Substance and Sexual Activity  . Alcohol use: Yes    Comment: wine at night  . Drug use: No  . Sexual activity: Never  Other Topics Concern  . Not on file  Social History Narrative  . Not on file   Social Determinants of Health   Financial Resource Strain: Low Risk   . Difficulty of Paying Living Expenses: Not hard at all  Food Insecurity: No Food Insecurity  . Worried About Charity fundraiser in the Last Year: Never true  . Ran  Out of Food in the Last Year: Never true  Transportation Needs: No Transportation Needs  . Lack of Transportation (Medical): No  . Lack of Transportation (Non-Medical): No  Physical Activity: Inactive  . Days of Exercise per Week: 0 days  . Minutes of Exercise per Session: 0 min  Stress: No Stress Concern Present  . Feeling of Stress : Not at all  Social Connections: Unknown  . Frequency of Communication with Friends and Family: Patient refused  . Frequency of Social Gatherings with Friends and Family: Patient refused  . Attends Religious Services: Patient refused  . Active Member of Clubs or Organizations: Patient refused  . Attends Archivist Meetings: Patient refused  . Marital Status: Widowed    Tobacco Counseling Counseling given: Not Answered   Clinical Intake:  Pre-visit preparation completed: Yes  Pain : No/denies pain     BMI - recorded: 25.06 Nutritional Status: BMI 25 -29 Overweight Nutritional Risks: None Diabetes: No  How often do you need to have someone help you when you read instructions, pamphlets, or other written materials from your doctor or pharmacy?: 1 - Never  Interpreter Needed?: No  Information entered by :: Clemetine Marker LPN   Activities of Daily Living In your present state of health, do you have any difficulty performing the following activities: 05/04/2019 07/13/2018  Hearing? Y -  Comment wears hearing aids -  Vision? N -  Difficulty concentrating or making decisions? N -  Walking or climbing stairs? Y -  Dressing or bathing? N -  Doing errands, shopping? Becky Shea  Preparing Food and eating ? N -  Using the Toilet? N -  In the past Becky months, have you accidently leaked urine? N -  Do you have problems with loss of bowel control? N -  Managing your Medications? N -  Managing your Finances? N -  Housekeeping or managing your Housekeeping? N -  Some recent data might be hidden     Immunizations and Health  Maintenance Immunization History  Administered Date(s) Administered  . Influenza, High Dose Seasonal PF 11/29/2017  . Influenza-Unspecified 10/23/2018  . PFIZER SARS-COV-2 Vaccination 03/21/2019  . Pneumococcal Conjugate-13 10/23/2018  . Pneumococcal Polysaccharide-23 04/06/2015   Health Maintenance Due  Topic Date Due  . TETANUS/TDAP  Never done  . DEXA SCAN  Never done    Patient Care Team: [name], MD as PCP - General (Family Medicine)  Indicate any recent Medical Services you may have received from other than [facility] providers in the past year (date may be approximate).     Assessment:   This is a routine wellness examination for Becky Shea.  Hearing/Vision screen  Hearing Screening   125Hz  250Hz  500Hz  1000Hz  2000Hz  3000Hz  4000Hz  6000Hz  8000Hz   Right ear:           Left ear:  Comments: Pt wears hearing aids   Vision Screening Comments: Annual vision screenings done by [facility] issues and exercise activities discussed: Current Exercise Habits: The patient does not participate in regular exercise at present, Exercise limited by: orthopedic condition(s)  Goals    . DIET - INCREASE WATER INTAKE     Recommend drinking 6-8 glasses of water per day       Depression Screen PHQ 2/9 Scores 05/04/2019 05/04/2019 04/09/2018  PHQ - 2 Score 0 0 0  PHQ- 9 Score - - 0    Fall Risk Fall Risk  05/04/2019  Falls in the past year? 0  Number falls in past yr: 0  Injury with Fall? 0  Risk for fall due to : Impaired balance/gait  Follow up Falls prevention discussed    FALL RISK PREVENTION PERTAINING TO THE HOME:  Any stairs in or around the home? Yes  If so, do they handrails? Yes   Home free of loose throw rugs in walkways, pet beds, electrical cords, etc? Yes  Adequate lighting in your home to reduce risk of falls? Yes   ASSISTIVE DEVICES UTILIZED TO PREVENT FALLS:  Life alert? No  Use of a cane, walker or w/c? Yes  Grab bars in the bathroom?  Yes  Shower chair or bench in shower? Yes  Elevated toilet seat or a handicapped toilet? Yes   DME ORDERS:  DME order needed?  No   TIMED UP AND GO:  Was the test performed? No . Telephonic visit.   Education: Fall risk prevention has been discussed.  Intervention(s) required? No   Cognitive Function: 6CIT deferred due to patient having difficulty hearing during telephonic visit.         Screening Tests Health Maintenance  Topic Date Due  . TETANUS/TDAP  Never done  . DEXA SCAN  Never done  . INFLUENZA VACCINE  08/28/2019  . PNA vac Low Risk Adult  Completed    Qualifies for Shingles Vaccine? Yes . Due for Shingrix. Education has been provided regarding the importance of this vaccine. Pt has been advised to call insurance company to determine out of pocket expense. Advised may also receive vaccine at local pharmacy or Health Dept. Verbalized acceptance and understanding.  Tdap: Although this vaccine is not a covered service during a Wellness Exam, does the patient still wish to receive this vaccine today?  No .  Education has been provided regarding the importance of this vaccine. Advised may receive this vaccine at local pharmacy or Health Dept. Aware to provide a copy of the vaccination record if obtained from local pharmacy or Health Dept. Verbalized acceptance and understanding.  Flu Vaccine: Up to date  Pneumococcal Vaccine: Up to date   Cancer Screenings:  Colorectal Screening: No longer required.   Mammogram:  No longer required.   Bone Density: Due. Ordered today. Pt provided with contact information to schedule an appt.   Lung Cancer Screening: (Low Dose CT Chest recommended if Age 63-80 years, 30 pack-year currently smoking OR have quit w/in 15years.) does not qualify.   Additional Screening:  Hepatitis C Screening: no longer required  Vision Screening: Recommended annual ophthalmology exams for early detection of glaucoma and other disorders of the  eye. Is the patient up to date with their annual eye exam?  Yes  Who is the provider or what is the name of the office in which the pt attends annual eye exams? [place] Screening: Recommended annual dental  exams for proper oral hygiene  Community Resource Referral:  CRR required this visit?  No      Plan:    I have personally reviewed and addressed the Medicare Annual Wellness questionnaire and have noted the following in the patient's chart:  A. Medical and social history B. Use of alcohol, tobacco or illicit drugs  C. Current medications and supplements D. Functional ability and status E.  Nutritional status F.  Physical activity G. Advance directives H. List of other physicians I.  Hospitalizations, surgeries, and ER visits in previous 12 months J.  [place] such as hearing and vision if needed, cognitive and depression L. Referrals and appointments   In addition, I have reviewed and discussed with patient certain preventive protocols, quality metrics, and best practice recommendations. A written personalized care plan for preventive services as well as general preventive health recommendations were provided to patient.   Signed,  Clemetine Marker, LPN Nurse Health Advisor   Nurse Notes: none

## 2019-05-04 NOTE — Patient Instructions (Signed)
Becky Shea , Thank you for taking time to come for your Medicare Wellness Visit. I appreciate your ongoing commitment to your health goals. Please review the following plan we discussed and let me know if I can assist you in the future.   Screening recommendations/referrals: Colonoscopy: no longer required Mammogram: no longer required Bone Density: Please call [phone] to schedule your bone density screening.  Recommended yearly ophthalmology/optometry visit for glaucoma screening and checkup Recommended yearly dental visit for hygiene and checkup  Vaccinations: Influenza vaccine: done 10/23/18 Pneumococcal vaccine: done 10/23/18 Tdap vaccine: due Shingles vaccine: Shingrix discussed. Please contact your pharmacy for coverage information.  Covid-19: done 03/21/19 & 04/12/19   Advanced directives: Please bring a copy of your health care power of attorney and living will to the office at your convenience.  Conditions/risks identified: Recommend drinking 6-8 glasses of water per day  Next appointment: Please follow up in one year for your Medicare Annual Wellness visit.     Preventive Care 65 Years and Older, Female Preventive care refers to lifestyle choices and visits with your health care provider that can promote health and wellness. What does preventive care include?  A yearly physical exam. This is also called an annual well check.  Dental exams once or twice a year.  Routine eye exams. Ask your health care provider how often you should have your eyes checked.  Personal lifestyle choices, including:  Daily care of your teeth and gums.  Regular physical activity.  Eating a healthy diet.  Avoiding tobacco and drug use.  Limiting alcohol use.  Practicing safe sex.  Taking low-dose aspirin every day.  Taking vitamin and mineral supplements as recommended by your health care provider. What happens during an annual well check? The services and screenings done by your  health care provider during your annual well check will depend on your age, overall health, lifestyle risk factors, and family history of disease. Counseling  Your health care provider may ask you questions about your:  Alcohol use.  Tobacco use.  Drug use.  Emotional well-being.  Home and relationship well-being.  Sexual activity.  Eating habits.  History of falls.  Memory and ability to understand (cognition).  Work and work Statistician.  Reproductive health. Screening  You may have the following tests or measurements:  Height, weight, and BMI.  Blood pressure.  Lipid and cholesterol levels. These may be checked every 5 years, or more frequently if you are over 12 years old.  Skin check.  Lung cancer screening. You may have this screening every year starting at age 10 if you have a 30-pack-year history of smoking and currently smoke or have quit within the past 15 years.  Fecal occult blood test (FOBT) of the stool. You may have this test every year starting at age 44.  Flexible sigmoidoscopy or colonoscopy. You may have a sigmoidoscopy every 5 years or a colonoscopy every 10 years starting at age 44.  Hepatitis C blood test.  Hepatitis B blood test.  Sexually transmitted disease (STD) testing.  Diabetes screening. This is done by checking your blood sugar (glucose) after you have not eaten for a while (fasting). You may have this done every 1-3 years.  Bone density scan. This is done to screen for osteoporosis. You may have this done starting at age 92.  Mammogram. This may be done every 1-2 years. Talk to your health care provider about how often you should have regular mammograms. Talk with your health care provider about your  test results, treatment options, and if necessary, the need for more tests. Vaccines  Your health care provider may recommend certain vaccines, such as:  Influenza vaccine. This is recommended every year.  Tetanus, diphtheria, and  acellular pertussis (Tdap, Td) vaccine. You may need a Td booster every 10 years.  Zoster vaccine. You may need this after age 79.  Pneumococcal 13-valent conjugate (PCV13) vaccine. One dose is recommended after age 57.  Pneumococcal polysaccharide (PPSV23) vaccine. One dose is recommended after age 52. Talk to your health care provider about which screenings and vaccines you need and how often you need them. This information is not intended to replace advice given to you by your health care provider. Make sure you discuss any questions you have with your health care provider. Document Released: 02/09/2015 Document Revised: 10/03/2015 Document Reviewed: 11/14/2014 Elsevier Interactive Patient Education  2017 [place] Prevention in the Home Falls can cause injuries. They can happen to people of all ages. There are many things you can do to make your home safe and to help prevent falls. What can I do on the outside of my home?  Regularly fix the edges of walkways and driveways and fix any cracks.  Remove anything that might make you trip as you walk through a door, such as a raised step or threshold.  Trim any bushes or trees on the path to your home.  Use bright outdoor lighting.  Clear any walking paths of anything that might make someone trip, such as rocks or tools.  Regularly check to see if handrails are loose or broken. Make sure that both sides of any steps have handrails.  Any raised decks and porches should have guardrails on the edges.  Have any leaves, snow, or ice cleared regularly.  Use sand or salt on walking paths during winter.  Clean up any spills in your garage right away. This includes oil or grease spills. What can I do in the bathroom?  Use night lights.  Install grab bars by the toilet and in the tub and shower. Do not use towel bars as grab bars.  Use non-skid mats or decals in the tub or shower.  If you need to sit down in the shower, use  a plastic, non-slip stool.  Keep the floor dry. Clean up any water that spills on the floor as soon as it happens.  Remove soap buildup in the tub or shower regularly.  Attach bath mats securely with double-sided non-slip rug tape.  Do not have throw rugs and other things on the floor that can make you trip. What can I do in the bedroom?  Use night lights.  Make sure that you have a light by your bed that is easy to reach.  Do not use any sheets or blankets that are too big for your bed. They should not hang down onto the floor.  Have a firm chair that has side arms. You can use this for support while you get dressed.  Do not have throw rugs and other things on the floor that can make you trip. What can I do in the kitchen?  Clean up any spills right away.  Avoid walking on wet floors.  Keep items that you use a lot in easy-to-reach places.  If you need to reach something above you, use a strong step stool that has a grab bar.  Keep electrical cords out of the way.  Do not use floor polish or wax that  makes floors slippery. If you must use wax, use non-skid floor wax.  Do not have throw rugs and other things on the floor that can make you trip. What can I do with my stairs?  Do not leave any items on the stairs.  Make sure that there are handrails on both sides of the stairs and use them. Fix handrails that are broken or loose. Make sure that handrails are as long as the stairways.  Check any carpeting to make sure that it is firmly attached to the stairs. Fix any carpet that is loose or worn.  Avoid having throw rugs at the top or bottom of the stairs. If you do have throw rugs, attach them to the floor with carpet tape.  Make sure that you have a light switch at the top of the stairs and the bottom of the stairs. If you do not have them, ask someone to add them for you. What else can I do to help prevent falls?  Wear shoes that:  Do not have high heels.  Have  rubber bottoms.  Are comfortable and fit you well.  Are closed at the toe. Do not wear sandals.  If you use a stepladder:  Make sure that it is fully opened. Do not climb a closed stepladder.  Make sure that both sides of the stepladder are locked into place.  Ask someone to hold it for you, if possible.  Clearly mark and make sure that you can see:  Any grab bars or handrails.  First and last steps.  Where the edge of each step is.  Use tools that help you move around (mobility aids) if they are needed. These include:  Canes.  Walkers.  Scooters.  Crutches.  Turn on the lights when you go into a dark area. Replace any light bulbs as soon as they burn out.  Set up your furniture so you have a clear path. Avoid moving your furniture around.  If any of your floors are uneven, fix them.  If there are any pets around you, be aware of where they are.  Review your medicines with your doctor. Some medicines can make you feel dizzy. This can increase your chance of falling. Ask your doctor what other things that you can do to help prevent falls. This information is not intended to replace advice given to you by your health care provider. Make sure you discuss any questions you have with your health care provider. Document Released: 11/09/2008 Document Revised: 06/21/2015 Document Reviewed: 02/17/2014 Elsevier Interactive Patient Education  2017 [employer].

## 2019-06-13 ENCOUNTER — Ambulatory Visit
Admission: RE | Admit: 2019-06-13 | Discharge: 2019-06-13 | Disposition: A | Discharge status: Home / Self Care | Payer: Medicare HMO | Source: Ambulatory Visit | Attending: Family Medicine | Admitting: Family Medicine

## 2019-06-13 ENCOUNTER — Other Ambulatory Visit: Payer: Self-pay

## 2019-06-13 ENCOUNTER — Telehealth: Payer: Self-pay | Admitting: Family Medicine

## 2019-06-13 DIAGNOSIS — Z780 Asymptomatic menopausal state: Secondary | ICD-10-CM

## 2019-06-13 DIAGNOSIS — M810 Age-related osteoporosis without current pathological fracture: Secondary | ICD-10-CM

## 2019-06-13 MED ORDER — ALENDRONATE SODIUM 70 MG PO TABS: 70.0000 mg | ORAL_TABLET | ORAL | 0 refills | Status: DC

## 2019-06-13 NOTE — Telephone Encounter (Signed)
Went over bone density- sent in Fosamax [facility]- recheck on med in 12 weeks

## 2019-06-13 NOTE — Progress Notes (Unsigned)
Sent in Fosamax [facility]

## 2019-06-13 NOTE — Telephone Encounter (Unsigned)
Copied from [place] [id number]. Topic: General - Inquiry >> Jun 13, 2019  4:28 PM Mathis Bud wrote: Reason for CRM: Becky Shea patient daughter called requesting PCP nurse to call back regarding bone density test patient had done.  Call back [phone] Or [phone]

## 2019-11-21 ENCOUNTER — Other Ambulatory Visit: Payer: Self-pay

## 2019-11-21 ENCOUNTER — Telehealth: Payer: Self-pay

## 2019-11-21 DIAGNOSIS — M810 Age-related osteoporosis without current pathological fracture: Secondary | ICD-10-CM

## 2019-11-21 MED ORDER — ALENDRONATE SODIUM 70 MG PO TABS: 70.0000 mg | ORAL_TABLET | ORAL | 0 refills | Status: DC

## 2019-11-21 NOTE — Telephone Encounter (Unsigned)
Copied from [place] [id number]. Topic: General - Other >> Nov 21, 2019 11:17 AM Alanda Slim E wrote: Reason for CRM:  Daughter would like to speak with the nurse and has questions about recommendation of the moderna or pfizer booster / she also has question about the next step in her osteoparosis treatments/ please advise

## 2019-11-21 NOTE — Progress Notes (Unsigned)
Sent in Allendronate

## 2020-02-09 ENCOUNTER — Encounter: Payer: Self-pay | Admitting: Family Medicine

## 2020-02-09 ENCOUNTER — Other Ambulatory Visit: Payer: Self-pay | Admitting: Family Medicine

## 2020-02-09 ENCOUNTER — Ambulatory Visit (INDEPENDENT_AMBULATORY_CARE_PROVIDER_SITE_OTHER): Payer: Medicare HMO | Admitting: Family Medicine

## 2020-02-09 ENCOUNTER — Other Ambulatory Visit: Payer: Self-pay

## 2020-02-09 VITALS — BP 130/82 | HR 64 | Ht 64.0 in | Wt 170.0 lb

## 2020-02-09 DIAGNOSIS — Z780 Asymptomatic menopausal state: Secondary | ICD-10-CM | POA: Diagnosis not present

## 2020-02-09 DIAGNOSIS — Z8781 Personal history of (healed) traumatic fracture: Secondary | ICD-10-CM | POA: Diagnosis not present

## 2020-02-09 DIAGNOSIS — R69 Illness, unspecified: Secondary | ICD-10-CM

## 2020-02-09 DIAGNOSIS — M810 Age-related osteoporosis without current pathological fracture: Secondary | ICD-10-CM

## 2020-02-09 MED ORDER — ALENDRONATE SODIUM 70 MG PO TABS: ORAL_TABLET | ORAL | 1 refills | Status: DC

## 2020-02-09 NOTE — Telephone Encounter (Signed)
   Notes to clinic Has appt today.

## 2020-02-09 NOTE — Progress Notes (Signed)
Date:  02/09/2020   Name:  Becky Shea [facility]   DOB:  1931-10-08   MRN:  [id number]   Chief Complaint: Osteoporosis  HPI  Lab Results  Component Value Date   CREATININE 0.60 07/16/2018   BUN 18 07/16/2018   NA 133 (L) 07/16/2018   K 4.1 07/16/2018   CL 102 07/16/2018   CO2 25 07/16/2018   Lab Results  Component Value Date   CHOL 140 07/23/2011   HDL 40 07/23/2011   LDLCALC 89 07/23/2011   TRIG 56 07/23/2011   No results found for: TSH Lab Results  Component Value Date   HGBA1C 5.7 07/23/2011   Lab Results  Component Value Date   WBC 15.9 (H) 07/16/2018   HGB 9.8 (L) 07/16/2018   HCT 29.8 (L) 07/16/2018   MCV 93.7 07/16/2018   PLT 212 07/16/2018   Lab Results  Component Value Date   ALT 17 07/22/2011   AST 30 07/22/2011   ALKPHOS 79 07/22/2011   BILITOT 0.4 07/22/2011     Review of Systems  Patient Active Problem List   Diagnosis Date Noted  . Rotator cuff tendinitis, right 12/27/2018  . History of malignant melanoma of skin 10/22/2018  . Status post total hip replacement, right 07/13/2018  . Sebaceous cyst 04/09/2018  . Lumbar spondylosis 03/17/2018  . Primary osteoarthritis of right hip 03/17/2018  . Primary osteoarthritis of both knees 12/27/2014    No Known Allergies  Past Surgical History:  Procedure Laterality Date  . CATARACT EXTRACTION, BILATERAL    . NO PAST SURGERIES    . TOTAL HIP ARTHROPLASTY Right 07/13/2018   Procedure: TOTAL HIP ARTHROPLASTY;  Surgeon: Corky Mull, MD;  Location: [facility] ORS;  Service: Orthopedics;  Laterality: Right;    Social History   Tobacco Use  . Smoking status: Never Smoker  . Smokeless tobacco: Never Used  Vaping Use  . Vaping Use: Never used  Substance Use Topics  . Alcohol use: Yes    Comment: wine at night  . Drug use: No     Medication list has been reviewed and updated.  Current Meds  Medication Sig  . acetaminophen (TYLENOL) 500 MG tablet Take 1,000 mg by mouth every 6 (six) hours  as needed for moderate pain.   [name] alendronate (FOSAMAX) 70 MG tablet TAKE 1 TABLET BY MOUTH EVERY 7 (SEVEN) DAYS WITH A FULL GLASS OF WATER ON AN EMPTY STOMACH.  [name] AZO-CRANBERRY PO Take 2 tablets by mouth daily before breakfast.  . gabapentin (NEURONTIN) 300 MG capsule Take 300 mg by mouth 3 (three) times daily.   [name] KRILL OIL PO Take 350 mg by mouth daily.  [name] MAGNESIUM PO Take 1 tablet by mouth daily.  . Melatonin 3 MG TABS Take 1 tablet by mouth at bedtime.  . Misc Natural Products (OSTEO BI-FLEX ADV DOUBLE ST PO) Take 2 tablets by mouth daily.  . Multiple Vitamins-Minerals (WOMENS MULTI GUMMIES) CHEW Chew 2 each by mouth daily.   . TURMERIC PO Take by mouth.  . vitamin B-12 (CYANOCOBALAMIN) 1000 MCG tablet Take 1,000 mcg by mouth daily.    PHQ 2/9 Scores 02/09/2020 05/04/2019 05/04/2019 04/09/2018  PHQ - 2 Score 0 0 0 0  PHQ- 9 Score 0 - - 0    No flowsheet data found.  BP Readings from Last 3 Encounters:  02/09/20 130/82  03/01/19 136/78  08/24/18 138/70    Physical Exam  Wt Readings from Last 3 Encounters:  02/09/20 170  lb (77.1 kg)  05/04/19 146 lb (66.2 kg)  03/01/19 149 lb (67.6 kg)    BP 130/82   Pulse 64   Ht 5\' 4"  (1.626 m)   Wt 170 lb (77.1 kg)   BMI 29.18 kg/m   Assessment and Plan:  1. Age-related osteoporosis without current pathological fracture Chronic.  Persistent.  Relatively stable.  Patient has a history of osteoporosis for which she is continued on Fosamax 70 mg once a week for menopausal bone loss and  maintenance .  Patient is tolerating this and we will continue at this time and will recheck in 6 months. - alendronate (FOSAMAX) 70 MG tablet; TAKE 1 TABLET BY MOUTH EVERY 7 (SEVEN) DAYS WITH A FULL GLASS OF WATER ON AN EMPTY STOMACH.  Dispense: 12 tablet; Refill: 1  2. Postmenopausal estrogen deficiency Chronic.  Controlled.  Stable.  We will continue on current   3. History of compression fracture of spine Review of patient's chart from the [facility] spine  center notes that she has compression fractures of the vertebrae.  The daughter states she seems to be becoming more stooped and her posture for which she blames the walker although this may be due to the progression of her vertebral compression fractures.  I have suggested that she discuss this with her orthopedic physicians at her next visit since has been almost 2 years since her last evaluation of her lumbar back situation.  In the meantime the pain is controlled with her gabapentin 300 mg twice a day from which she gets from her orthopedic physician as well as Tylenol at night. - Hepatic Function Panel (6)  4. Taking medication for chronic disease Patient is taken Tylenol at night Tylenol PM and a regular.  We will check her hepatic function to make sure that there is no issues with hepatotoxicity of the acetaminophen.

## 2020-02-10 DIAGNOSIS — L821 Other seborrheic keratosis: Secondary | ICD-10-CM | POA: Diagnosis not present

## 2020-02-10 DIAGNOSIS — Z85820 Personal history of malignant melanoma of skin: Secondary | ICD-10-CM | POA: Diagnosis not present

## 2020-02-10 LAB — HEPATIC FUNCTION PANEL (6)
ALT: 17 IU/L (ref 0–32)
AST: 21 IU/L (ref 0–40)
Albumin: 4.6 g/dL (ref 3.6–4.6)
Alkaline Phosphatase: 73 IU/L (ref 44–121)
Bilirubin Total: 0.3 mg/dL (ref 0.0–1.2)
Bilirubin, Direct: <0.1 mg/dL (ref 0.00–0.40)

## 2020-04-12 ENCOUNTER — Telehealth: Payer: Self-pay

## 2020-04-12 NOTE — Telephone Encounter (Unsigned)
Copied from [place] [id number]. Topic: General - Other >> Apr 12, 2020 11:56 AM Yvette Rack wrote: Reason for CRM: Pt daughter [name] stated the handicap placard is about to expire and she would like to renew it. [name] requests call back to advise if she has to go to [facility] for the form or if the office has a copy of the form that can be completed to renew the handicap placard. Cb# [phone]

## 2020-04-12 NOTE — Telephone Encounter (Signed)
Will drop off from today at 2:30

## 2020-04-12 NOTE — Telephone Encounter (Signed)
She can get from [facility] and bring here IF jones filled the first one out

## 2020-05-02 DIAGNOSIS — M549 Dorsalgia, unspecified: Secondary | ICD-10-CM | POA: Diagnosis not present

## 2020-05-02 DIAGNOSIS — Z79899 Other long term (current) drug therapy: Secondary | ICD-10-CM | POA: Diagnosis not present

## 2020-05-07 ENCOUNTER — Ambulatory Visit (INDEPENDENT_AMBULATORY_CARE_PROVIDER_SITE_OTHER): Payer: Medicare HMO

## 2020-05-07 DIAGNOSIS — Z0000 Encounter for general adult medical examination without abnormal findings: Secondary | ICD-10-CM | POA: Diagnosis not present

## 2020-05-07 NOTE — Patient Instructions (Signed)
Ms. Bowery , Thank you for taking time to come for your Medicare Wellness Visit. I appreciate your ongoing commitment to your health goals. Please review the following plan we discussed and let me know if I can assist you in the future.   Screening recommendations/referrals: Colonoscopy: no longer required Mammogram: no longer requied Bone Density: done 06/13/19. Repeat in 2023. Recommended yearly ophthalmology/optometry visit for glaucoma screening and checkup Recommended yearly dental visit for hygiene and checkup  Vaccinations: Influenza vaccine: done 11/18/19 Pneumococcal vaccine: done 10/23/18 Tdap vaccine: due Shingles vaccine: please bring a copy of your vaccine record to your next appt   Covid-19:done 03/21/19, 04/12/19 & 12/14/19  Advanced directives: Please bring a copy of your health care power of attorney and living will to the office at your convenience.  Conditions/risks identified: Recommend continuing fall prevention in the home  Next appointment: Follow up in one year for your annual wellness visit    Preventive Care 65 Years and Older, Female Preventive care refers to lifestyle choices and visits with your health care provider that can promote health and wellness. What does preventive care include?  A yearly physical exam. This is also called an annual well check.  Dental exams once or twice a year.  Routine eye exams. Ask your health care provider how often you should have your eyes checked.  Personal lifestyle choices, including:  Daily care of your teeth and gums.  Regular physical activity.  Eating a healthy diet.  Avoiding tobacco and drug use.  Limiting alcohol use.  Practicing safe sex.  Taking low-dose aspirin every day.  Taking vitamin and mineral supplements as recommended by your health care provider. What happens during an annual well check? The services and screenings done by your health care provider during your annual well check will  depend on your age, overall health, lifestyle risk factors, and family history of disease. Counseling  Your health care provider may ask you questions about your:  Alcohol use.  Tobacco use.  Drug use.  Emotional well-being.  Home and relationship well-being.  Sexual activity.  Eating habits.  History of falls.  Memory and ability to understand (cognition).  Work and work Statistician.  Reproductive health. Screening  You may have the following tests or measurements:  Height, weight, and BMI.  Blood pressure.  Lipid and cholesterol levels. These may be checked every 5 years, or more frequently if you are over 58 years old.  Skin check.  Lung cancer screening. You may have this screening every year starting at age 55 if you have a 30-pack-year history of smoking and currently smoke or have quit within the past 15 years.  Fecal occult blood test (FOBT) of the stool. You may have this test every year starting at age 61.  Flexible sigmoidoscopy or colonoscopy. You may have a sigmoidoscopy every 5 years or a colonoscopy every 10 years starting at age 74.  Hepatitis C blood test.  Hepatitis B blood test.  Sexually transmitted disease (STD) testing.  Diabetes screening. This is done by checking your blood sugar (glucose) after you have not eaten for a while (fasting). You may have this done every 1-3 years.  Bone density scan. This is done to screen for osteoporosis. You may have this done starting at age 68.  Mammogram. This may be done every 1-2 years. Talk to your health care provider about how often you should have regular mammograms. Talk with your health care provider about your test results, treatment options, and if  necessary, the need for more tests. Vaccines  Your health care provider may recommend certain vaccines, such as:  Influenza vaccine. This is recommended every year.  Tetanus, diphtheria, and acellular pertussis (Tdap, Td) vaccine. You may need a  Td booster every 10 years.  Zoster vaccine. You may need this after age 69.  Pneumococcal 13-valent conjugate (PCV13) vaccine. One dose is recommended after age 106.  Pneumococcal polysaccharide (PPSV23) vaccine. One dose is recommended after age 74. Talk to your health care provider about which screenings and vaccines you need and how often you need them. This information is not intended to replace advice given to you by your health care provider. Make sure you discuss any questions you have with your health care provider. Document Released: 02/09/2015 Document Revised: 10/03/2015 Document Reviewed: 11/14/2014 Elsevier Interactive Patient Education  2017 [employer] Prevention in the Home Falls can cause injuries. They can happen to people of all ages. There are many things you can do to make your home safe and to help prevent falls. What can I do on the outside of my home?  Regularly fix the edges of walkways and driveways and fix any cracks.  Remove anything that might make you trip as you walk through a door, such as a raised step or threshold.  Trim any bushes or trees on the path to your home.  Use bright outdoor lighting.  Clear any walking paths of anything that might make someone trip, such as rocks or tools.  Regularly check to see if handrails are loose or broken. Make sure that both sides of any steps have handrails.  Any raised decks and porches should have guardrails on the edges.  Have any leaves, snow, or ice cleared regularly.  Use sand or salt on walking paths during winter.  Clean up any spills in your garage right away. This includes oil or grease spills. What can I do in the bathroom?  Use night lights.  Install grab bars by the toilet and in the tub and shower. Do not use towel bars as grab bars.  Use non-skid mats or decals in the tub or shower.  If you need to sit down in the shower, use a plastic, non-slip stool.  Keep the floor dry. Clean  up any water that spills on the floor as soon as it happens.  Remove soap buildup in the tub or shower regularly.  Attach bath mats securely with double-sided non-slip rug tape.  Do not have throw rugs and other things on the floor that can make you trip. What can I do in the bedroom?  Use night lights.  Make sure that you have a light by your bed that is easy to reach.  Do not use any sheets or blankets that are too big for your bed. They should not hang down onto the floor.  Have a firm chair that has side arms. You can use this for support while you get dressed.  Do not have throw rugs and other things on the floor that can make you trip. What can I do in the kitchen?  Clean up any spills right away.  Avoid walking on wet floors.  Keep items that you use a lot in easy-to-reach places.  If you need to reach something above you, use a strong step stool that has a grab bar.  Keep electrical cords out of the way.  Do not use floor polish or wax that makes floors slippery. If you must  use wax, use non-skid floor wax.  Do not have throw rugs and other things on the floor that can make you trip. What can I do with my stairs?  Do not leave any items on the stairs.  Make sure that there are handrails on both sides of the stairs and use them. Fix handrails that are broken or loose. Make sure that handrails are as long as the stairways.  Check any carpeting to make sure that it is firmly attached to the stairs. Fix any carpet that is loose or worn.  Avoid having throw rugs at the top or bottom of the stairs. If you do have throw rugs, attach them to the floor with carpet tape.  Make sure that you have a light switch at the top of the stairs and the bottom of the stairs. If you do not have them, ask someone to add them for you. What else can I do to help prevent falls?  Wear shoes that:  Do not have high heels.  Have rubber bottoms.  Are comfortable and fit you well.  Are  closed at the toe. Do not wear sandals.  If you use a stepladder:  Make sure that it is fully opened. Do not climb a closed stepladder.  Make sure that both sides of the stepladder are locked into place.  Ask someone to hold it for you, if possible.  Clearly mark and make sure that you can see:  Any grab bars or handrails.  First and last steps.  Where the edge of each step is.  Use tools that help you move around (mobility aids) if they are needed. These include:  Canes.  Walkers.  Scooters.  Crutches.  Turn on the lights when you go into a dark area. Replace any light bulbs as soon as they burn out.  Set up your furniture so you have a clear path. Avoid moving your furniture around.  If any of your floors are uneven, fix them.  If there are any pets around you, be aware of where they are.  Review your medicines with your doctor. Some medicines can make you feel dizzy. This can increase your chance of falling. Ask your doctor what other things that you can do to help prevent falls. This information is not intended to replace advice given to you by your health care provider. Make sure you discuss any questions you have with your health care provider. Document Released: 11/09/2008 Document Revised: 06/21/2015 Document Reviewed: 02/17/2014 Elsevier Interactive Patient Education  2017 [employer].

## 2020-05-07 NOTE — Progress Notes (Signed)
Subjective:   Clay is a 87 y.o. female who presents for Medicare Annual (Subsequent) preventive examination.  Virtual Visit via Telephone Note  I connected with  Bettey Costa on 05/07/20 at 11:20 AM EDT by telephone and verified that I am speaking with the correct person using two identifiers.  Location: Patient: home Provider: [facility] Persons participating in the virtual visit: patient & daughter [name] [facility] Health Advisor   I discussed the limitations, risks, security and privacy concerns of performing an evaluation and management service by telephone and the availability of in person appointments. The patient expressed understanding and agreed to proceed.  Interactive audio and video telecommunications were attempted between this nurse and patient, however failed, due to patient having technical difficulties OR patient did not have access to video capability.  We continued and completed visit with audio only.  Some vital signs may be absent or patient reported.   Clemetine Marker, LPN    Review of Systems     Cardiac Risk Factors include: advanced age (>48men, >6 women)     Objective:    There were no vitals filed for this visit. There is no height or weight on file to calculate BMI.  Advanced Directives 05/07/2020 05/04/2019 07/13/2018 07/13/2018 02/08/2018 10/13/2016  Does Patient Have a Medical Advance Directive? Yes Yes Yes Yes No Yes  Type of Paramedic of [place];Living will [place];Living will [place];Living will [place] - -  Does patient want to make changes to medical advance directive? - - No - Patient declined No - Patient declined - -  Copy of [place] in Chart? No - copy requested No - copy requested No - copy requested No - copy requested - -    Current Medications (verified) Outpatient Encounter Medications as of 05/07/2020   Medication Sig  . acetaminophen (TYLENOL) 500 MG tablet Take 1,000 mg by mouth every 6 (six) hours as needed for moderate pain.   [name] alendronate (FOSAMAX) 70 MG tablet TAKE 1 TABLET BY MOUTH EVERY 7 (SEVEN) DAYS WITH A FULL GLASS OF WATER ON AN EMPTY STOMACH.  [name] AZO-CRANBERRY PO Take 2 tablets by mouth daily before breakfast.  . gabapentin (NEURONTIN) 100 MG capsule Take by mouth. Take 1 capsule (100 mg total) by mouth nightly for 14 days. Decrease from 300 mg nightly to 100 mg nightly for two weeks then stop taking medication.  [name] KRILL OIL PO Take 350 mg by mouth daily.  . Magnesium Gluconate 550 MG TABS Take by mouth.  . Melatonin 3 MG TABS Take 1 tablet by mouth at bedtime.  . Misc Natural Products (OSTEO BI-FLEX ADV DOUBLE ST PO) Take 2 tablets by mouth daily.  . Multiple Vitamin (MULTI-VITAMIN) tablet Take 1 tablet by mouth daily.  . Multiple Vitamins-Minerals (PRESERVISION AREDS 2+MULTI VIT PO) Take by mouth.  . vitamin B-12 (CYANOCOBALAMIN) 1000 MCG tablet Take 1,000 mcg by mouth daily.  . [DISCONTINUED] gabapentin (NEURONTIN) 300 MG capsule Take 300 mg by mouth 3 (three) times daily.   . [DISCONTINUED] MAGNESIUM PO Take 1 tablet by mouth daily.  . [DISCONTINUED] Multiple Vitamins-Minerals (WOMENS MULTI GUMMIES) CHEW Chew 2 each by mouth daily.   . [DISCONTINUED] TURMERIC PO Take by mouth.   No facility-administered encounter medications on file as of 05/07/2020.    Allergies (verified) Poison ivy extract, Poison oak extract, and Poison sumac extract   History: Past Medical History:  Diagnosis Date  .  Arthritis   . GERD (gastroesophageal reflux disease)    rare but no meds  . HOH (hard of hearing)    wears bilateral hearing aides   Past Surgical History:  Procedure Laterality Date  . CATARACT EXTRACTION, BILATERAL    . NO PAST SURGERIES    . TOTAL HIP ARTHROPLASTY Right 07/13/2018   Procedure: TOTAL HIP ARTHROPLASTY;  Surgeon: Corky Mull, MD;  Location: [facility] ORS;   Service: Orthopedics;  Laterality: Right;   History reviewed. No pertinent family history. Social History   Socioeconomic History  . Marital status: Widowed    Spouse name: Not on file  . Number of children: Not on file  . Years of education: Not on file  . Highest education level: Not on file  Occupational History  . 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 Smoking status: Never Smoker  . Smokeless tobacco: Never Used  Vaping Use  . Vaping Use: Never used  Substance and Sexual Activity  . Alcohol use: Yes    Comment: occasional  . Drug use: No  . Sexual activity: Never  Other Topics Concern  . Not on file  Social History Narrative   Pt lives alone but daughter [name] comes by daily   Social Determinants of Health   Financial Resource Strain: Low Risk   . Difficulty of Paying Living Expenses: Not hard at all  Food Insecurity: No Food Insecurity  . Worried About Charity fundraiser in the Last Year: Never true  . Ran Out of Food in the Last Year: Never true  Transportation Needs: No Transportation Needs  . Lack of Transportation (Medical): No  . Lack of Transportation (Non-Medical): No  Physical Activity: Insufficiently Active  . Days of Exercise per Week: 2 days  . Minutes of Exercise per Session: 60 min  Stress: No Stress Concern Present  . Feeling of Stress : Not at all  Social Connections: Socially Isolated  . Frequency of Communication with Friends and Family: More than three times a week  . Frequency of Social Gatherings with Friends and Family: More than three times a week  . Attends Religious Services: Never  . Active Member of Clubs or Organizations: No  . Attends Archivist Meetings: Never  . Marital Status: Widowed    Tobacco Counseling Counseling given: Not Answered   Clinical Intake:  Pre-visit preparation completed: Yes  Pain : No/denies pain     Nutritional Risks: None Diabetes: No  How often do you  need to have someone help you when you read instructions, pamphlets, or other written materials from your doctor or pharmacy?: 1 - Never    Interpreter Needed?: No  Information entered by :: Clemetine Marker LPN   Activities of Daily Living In your present state of health, do you have any difficulty performing the following activities: 05/07/2020  Hearing? Y  Comment wears hearing aids  Vision? N  Difficulty concentrating or making decisions? Y  Walking or climbing stairs? Y  Dressing or bathing? N  Doing errands, shopping? Y  Preparing Food and eating ? N  Using the Toilet? N  In the past six months, have you accidently leaked urine? N  Do you have problems with loss of bowel control? N  Managing your Medications? N  Managing your Finances? N  Housekeeping or managing your Housekeeping? N  Some recent data might be hidden    Patient Care Team: [name], MD as PCP -  General (Family Medicine)  Indicate any recent Medical Services you may have received from other than [facility] providers in the past year (date may be approximate).     Assessment:   This is a routine wellness examination for Chrisette.  Hearing/Vision screen  Hearing Screening   125Hz  250Hz  500Hz  1000Hz  2000Hz  3000Hz  4000Hz  6000Hz  8000Hz   Right ear:           Left ear:           Comments: Pt wears hearing aids   Vision Screening Comments: Annual vision screenings done by [facility] Tempie Donning [facility]  Dietary issues and exercise activities discussed: Current Exercise Habits: Structured exercise class, Type of exercise: Other - see comments;stretching;calisthenics (water aerobics), Time (Minutes): 60, Frequency (Times/Week): 2, Weekly Exercise (Minutes/Week): 120, Intensity: Moderate, Exercise limited by: orthopedic condition(s)  Goals    . DIET - INCREASE WATER INTAKE     Recommend drinking 6-8 glasses of water per day       Depression Screen PHQ 2/9 Scores 05/07/2020 02/09/2020 05/04/2019 05/04/2019  04/09/2018  PHQ - 2 Score 0 0 0 0 0  PHQ- 9 Score - 0 - - 0    Fall Risk Fall Risk  05/07/2020 05/04/2019  Falls in the past year? 0 0  Number falls in past yr: 0 0  Injury with Fall? 0 0  Risk for fall due to : Impaired mobility;Impaired balance/gait;No Fall Risks Impaired balance/gait  Follow up Falls prevention discussed Falls prevention discussed    FALL RISK PREVENTION PERTAINING TO THE HOME:  Any stairs in or around the home? Yes  If so, are there any without handrails? No  Home free of loose throw rugs in walkways, pet beds, electrical cords, etc? Yes  Adequate lighting in your home to reduce risk of falls? Yes   ASSISTIVE DEVICES UTILIZED TO PREVENT FALLS:  Life alert? No  Use of a cane, walker or w/c? Yes  Grab bars in the bathroom? Yes  Shower chair or bench in shower? Yes  Elevated toilet seat or a handicapped toilet? No   TIMED UP AND GO:  Was the test performed? No . Telephonic visit.   Cognitive Function:     6CIT Screen 05/07/2020  What Year? 0 points  What month? 0 points  What time? 0 points  Count back from 20 0 points  Months in reverse 0 points  Repeat phrase 2 points  Total Score 2    Immunizations Immunization History  Administered Date(s) Administered  . Influenza, High Dose Seasonal PF 11/29/2017  . Influenza-Unspecified 10/23/2018, 11/18/2019  . PFIZER(Purple Top)SARS-COV-2 Vaccination 03/21/2019, 04/12/2019, 12/14/2019  . Pneumococcal Conjugate-13 10/23/2018  . Pneumococcal Polysaccharide-23 04/06/2015    TDAP status: Due, Education has been provided regarding the importance of this vaccine. Advised may receive this vaccine at local pharmacy or Health Dept. Aware to provide a copy of the vaccination record if obtained from local pharmacy or Health Dept. Verbalized acceptance and understanding.  Flu Vaccine status: Up to date  Pneumococcal vaccine status: Up to date  Covid-19 vaccine status: Completed vaccines  Qualifies for Shingles  Vaccine? Yes   Zostavax completed No   Shingrix Completed?: No.    Education has been provided regarding the importance of this vaccine. Patient has been advised to call insurance company to determine out of pocket expense if they have not yet received this vaccine. Advised may also receive vaccine at local pharmacy or Health Dept. Verbalized acceptance and understanding. Pt reports receiving vaccine;  advised to bring record.   Screening Tests Health Maintenance  Topic Date Due  . TETANUS/TDAP  01/27/2021 (Originally 07/03/1950)  . INFLUENZA VACCINE  08/27/2020  . DEXA SCAN  Completed  . COVID-19 Vaccine  Completed  . PNA vac Low Risk Adult  Completed  . HPV VACCINES  Aged Out    Health Maintenance  There are no preventive care reminders to display for this patient.  Colorectal cancer screening: No longer required.   Mammogram status: No longer required due to age.  Bone Density status: Completed 06/13/19. Results reflect: Bone density results: OSTEOPOROSIS. Repeat every 2 years.  Lung Cancer Screening: (Low Dose CT Chest recommended if Age 45-80 years, 30 pack-year currently smoking OR have quit w/in 15years.) does not qualify.   Additional Screening:  Hepatitis C Screening: does not qualify.   Vision Screening: Recommended annual ophthalmology exams for early detection of glaucoma and other disorders of the eye. Is the patient up to date with their annual eye exam?  Yes  Who is the provider or what is the name of the office in which the patient attends annual eye exams? [facility]  Dental Screening: Recommended annual dental exams for proper oral hygiene  Community Resource Referral / Chronic Care Management: CRR required this visit?  No   CCM required this visit?  No      Plan:     I have personally reviewed and noted the following in the patient's chart:   . Medical and social history . Use of alcohol, tobacco or illicit drugs  . Current medications and  supplements . Functional ability and status . Nutritional status . Physical activity . Advanced directives . List of other physicians . Hospitalizations, surgeries, and ER visits in previous 12 months . Vitals . Screenings to include cognitive, depression, and falls . Referrals and appointments  In addition, I have reviewed and discussed with patient certain preventive protocols, quality metrics, and best practice recommendations. A written personalized care plan for preventive services as well as general preventive health recommendations were provided to patient.     Clemetine Marker, LPN   [id number]   Nurse Notes: none

## 2020-05-21 DIAGNOSIS — H5212 Myopia, left eye: Secondary | ICD-10-CM | POA: Diagnosis not present

## 2020-05-21 DIAGNOSIS — H353131 Nonexudative age-related macular degeneration, bilateral, early dry stage: Secondary | ICD-10-CM | POA: Diagnosis not present

## 2020-07-24 ENCOUNTER — Ambulatory Visit (INDEPENDENT_AMBULATORY_CARE_PROVIDER_SITE_OTHER): Payer: Medicare HMO | Admitting: Family Medicine

## 2020-07-24 ENCOUNTER — Other Ambulatory Visit: Payer: Self-pay

## 2020-07-24 ENCOUNTER — Encounter: Payer: Self-pay | Admitting: Family Medicine

## 2020-07-24 VITALS — BP 136/78 | HR 60 | Ht 64.0 in | Wt 166.0 lb

## 2020-07-24 DIAGNOSIS — R69 Illness, unspecified: Secondary | ICD-10-CM

## 2020-07-24 DIAGNOSIS — M810 Age-related osteoporosis without current pathological fracture: Secondary | ICD-10-CM | POA: Diagnosis not present

## 2020-07-24 MED ORDER — ALENDRONATE SODIUM 70 MG PO TABS: ORAL_TABLET | ORAL | 1 refills | Status: DC

## 2020-07-24 NOTE — Progress Notes (Signed)
Date:  07/24/2020   Name:  Becky Shea [facility]   DOB:  1931/10/29   MRN:  [id number]   Chief Complaint: Osteoporosis  Patient is a 87 year old female who presents for a osteoporosis exam. The patient reports the following problems: none. Health maintenance has been reviewed up to date.     Lab Results  Component Value Date   CREATININE 0.60 07/16/2018   BUN 18 07/16/2018   NA 133 (L) 07/16/2018   K 4.1 07/16/2018   CL 102 07/16/2018   CO2 25 07/16/2018   Lab Results  Component Value Date   CHOL 140 07/23/2011   HDL 40 07/23/2011   LDLCALC 89 07/23/2011   TRIG 56 07/23/2011   No results found for: TSH Lab Results  Component Value Date   HGBA1C 5.7 07/23/2011   Lab Results  Component Value Date   WBC 15.9 (H) 07/16/2018   HGB 9.8 (L) 07/16/2018   HCT 29.8 (L) 07/16/2018   MCV 93.7 07/16/2018   PLT 212 07/16/2018   Lab Results  Component Value Date   ALT 17 02/09/2020   AST 21 02/09/2020   ALKPHOS 73 02/09/2020   BILITOT 0.3 02/09/2020     Review of Systems  Constitutional:  Negative for chills and fever.  HENT:  Negative for drooling, ear discharge, ear pain and sore throat.   Respiratory:  Negative for cough, shortness of breath and wheezing.   Cardiovascular:  Negative for chest pain, palpitations and leg swelling.  Gastrointestinal:  Negative for abdominal pain, blood in stool, constipation, diarrhea and nausea.  Endocrine: Negative for polydipsia.  Genitourinary:  Negative for dysuria, frequency, hematuria and urgency.  Musculoskeletal:  Negative for back pain, myalgias and neck pain.  Skin:  Negative for rash.  Allergic/Immunologic: Negative for environmental allergies.  Neurological:  Negative for dizziness and headaches.  Hematological:  Does not bruise/bleed easily.  Psychiatric/Behavioral:  Negative for suicidal ideas. The patient is not nervous/anxious.    Patient Active Problem List   Diagnosis Date Noted   Rotator cuff tendinitis, right  12/27/2018   History of malignant melanoma of skin 10/22/2018   Status post total hip replacement, right 07/13/2018   Sebaceous cyst 04/09/2018   Lumbar spondylosis 03/17/2018   Primary osteoarthritis of right hip 03/17/2018   Primary osteoarthritis of both knees 12/27/2014    Allergies  Allergen Reactions   Poison Ivy Extract Rash   Poison Oak Extract Rash   Poison Sumac Extract Rash    Past Surgical History:  Procedure Laterality Date   CATARACT EXTRACTION, BILATERAL     NO PAST SURGERIES     TOTAL HIP ARTHROPLASTY Right 07/13/2018   Procedure: TOTAL HIP ARTHROPLASTY;  Surgeon: Corky Mull, MD;  Location: [facility] ORS;  Service: Orthopedics;  Laterality: Right;    Social History   Tobacco Use   Smoking status: Never   Smokeless tobacco: Never  Vaping Use   Vaping Use: Never used  Substance Use Topics   Alcohol use: Yes    Comment: occasional   Drug use: No     Medication list has been reviewed and updated.  Current Meds  Medication Sig   acetaminophen (TYLENOL) 500 MG tablet Take 1,000 mg by mouth every 6 (six) hours as needed for moderate pain.    alendronate (FOSAMAX) 70 MG tablet TAKE 1 TABLET BY MOUTH EVERY 7 (SEVEN) DAYS WITH A FULL GLASS OF WATER ON AN EMPTY STOMACH.   AZO-CRANBERRY PO Take 2 tablets by  mouth daily before breakfast.   diphenhydramine-acetaminophen (TYLENOL PM EXTRA STRENGTH) 25-500 MG TABS tablet Take 1 tablet by mouth at bedtime as needed.   gabapentin (NEURONTIN) 100 MG capsule Take 100 mg by mouth at bedtime. Take 1 capsule (100 mg total) by mouth nightly- Dr Tellhon/ spine center   KRILL OIL PO Take 350 mg by mouth daily.   Magnesium Gluconate 250 MG TABS Take 1 tablet by mouth daily.   Melatonin 3 MG TABS Take 1 tablet by mouth at bedtime.   Misc Natural Products (OSTEO BI-FLEX ADV DOUBLE ST PO) Take 2 tablets by mouth daily.   Multiple Vitamin (MULTI-VITAMIN) tablet Take 1 tablet by mouth daily.   Multiple Vitamins-Minerals (PRESERVISION  AREDS 2+MULTI VIT PO) Take by mouth.   vitamin B-12 (CYANOCOBALAMIN) 1000 MCG tablet Take 1,000 mcg by mouth daily.    PHQ 2/9 Scores 07/24/2020 05/07/2020 02/09/2020 05/04/2019  PHQ - 2 Score 0 0 0 0  PHQ- 9 Score 0 - 0 -    GAD 7 : Generalized Anxiety Score 07/24/2020  Nervous, Anxious, on Edge 0  Control/stop worrying 0  Worry too much - different things 0  Trouble relaxing 0  Restless 0  Easily annoyed or irritable 0  Afraid - awful might happen 0  Total GAD 7 Score 0    BP Readings from Last 3 Encounters:  07/24/20 136/78  02/09/20 130/82  03/01/19 136/78    Physical Exam Vitals and nursing note reviewed.  Constitutional:      General: She is not in acute distress.    Appearance: She is not diaphoretic.  HENT:     Head: Normocephalic and atraumatic.     Right Ear: Tympanic membrane, ear canal and external ear normal.     Left Ear: Tympanic membrane, ear canal and external ear normal.     Nose: Nose normal. No congestion or rhinorrhea.     Mouth/Throat:     Mouth: Mucous membranes are moist.     Pharynx: No oropharyngeal exudate or posterior oropharyngeal erythema.  Eyes:     General:        Right eye: No discharge.        Left eye: No discharge.     Conjunctiva/sclera: Conjunctivae normal.     Pupils: Pupils are equal, round, and reactive to light.  Neck:     Thyroid: No thyromegaly.     Vascular: No JVD.  Cardiovascular:     Rate and Rhythm: Normal rate and regular rhythm.     Heart sounds: Normal heart sounds. No murmur heard.   No friction rub. No gallop.  Pulmonary:     Effort: Pulmonary effort is normal.     Breath sounds: Normal breath sounds. No wheezing or rhonchi.  Abdominal:     General: Bowel sounds are normal.     Palpations: Abdomen is soft. There is no mass.     Tenderness: There is no abdominal tenderness. There is no guarding.  Musculoskeletal:        General: Normal range of motion.     Cervical back: Normal range of motion and neck supple.   Lymphadenopathy:     Cervical: No cervical adenopathy.  Skin:    General: Skin is warm and dry.     Findings: No bruising or erythema.  Neurological:     Mental Status: She is alert.     Deep Tendon Reflexes: Reflexes are normal and symmetric.    Wt Readings from Last 3 Encounters:  07/24/20 166 lb (75.3 kg)  02/09/20 170 lb (77.1 kg)  05/04/19 146 lb (66.2 kg)    BP 136/78   Pulse 60   Ht 5\' 4"  (1.626 m)   Wt 166 lb (75.3 kg)   BMI 28.49 kg/m   Assessment and Plan:  1. Age-related osteoporosis without current pathological fracture Chronic.  Controlled.  Stable.  Patient will continue Fosamax 70 mg 1 week for 1 more year and at which time we will repeat testing. - alendronate (FOSAMAX) 70 MG tablet; TAKE 1 TABLET BY MOUTH EVERY 7 (SEVEN) DAYS WITH A FULL GLASS OF WATER ON AN EMPTY STOMACH.  Dispense: 12 tablet; Refill: 1  2. Taking medication for chronic disease Patient has medications for which we will check renal function panel for electrolytes and renal function. - Renal Function Panel

## 2020-07-25 LAB — RENAL FUNCTION PANEL
Albumin: 4.6 g/dL (ref 3.6–4.6)
BUN/Creatinine Ratio: 15 (ref 12–28)
BUN: 11 mg/dL (ref 8–27)
CO2: 21 mmol/L (ref 20–29)
Calcium: 10 mg/dL (ref 8.7–10.3)
Chloride: 100 mmol/L (ref 96–106)
Creatinine, Ser: 0.72 mg/dL (ref 0.57–1.00)
Glucose: 99 mg/dL (ref 65–99)
Phosphorus: 3.7 mg/dL (ref 3.0–4.3)
Potassium: 5 mmol/L (ref 3.5–5.2)
Sodium: 140 mmol/L (ref 134–144)
eGFR: 81 mL/min/{1.73_m2} (ref >59)

## 2020-08-01 ENCOUNTER — Encounter: Payer: Self-pay | Admitting: Family Medicine

## 2020-08-01 IMAGING — DX DG HIP (WITH OR WITHOUT PELVIS) 2-3V RIGHT
2 series · 2 of 2 positions shown · non-contrast
Comparison: No recent.

CLINICAL DATA: Right hip replacement.

EXAM:
DG HIP (WITH OR WITHOUT PELVIS) 2-3V RIGHT

[pelvis ap]
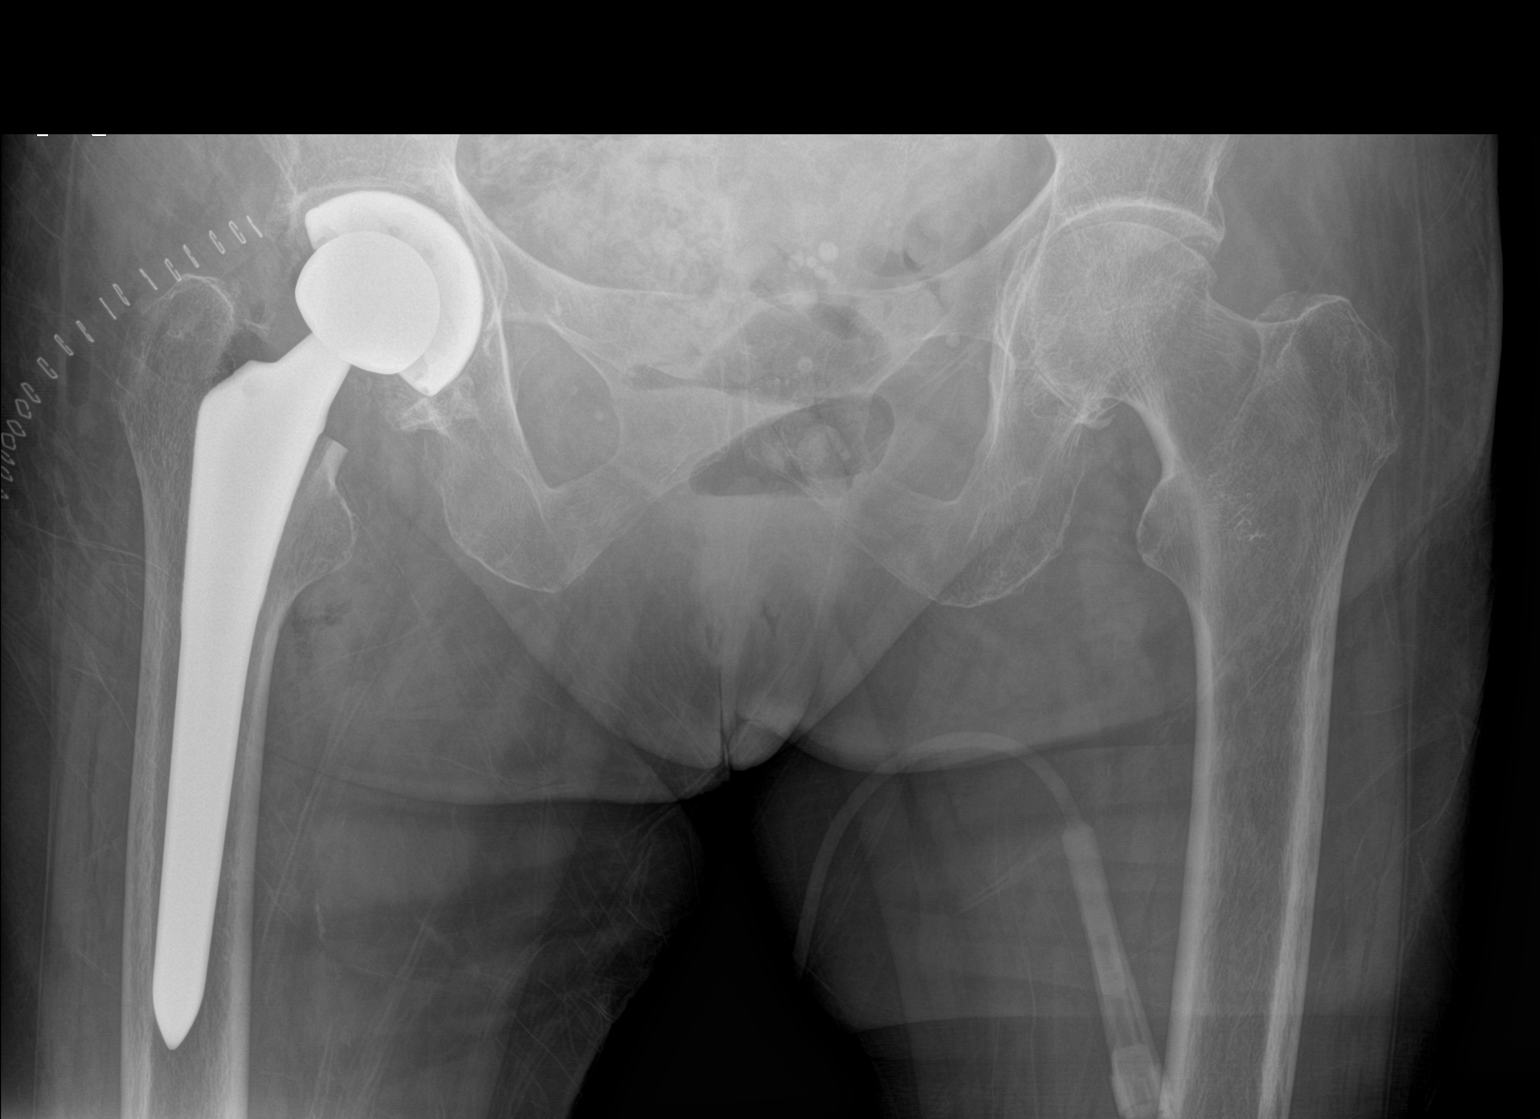

[hip lat]
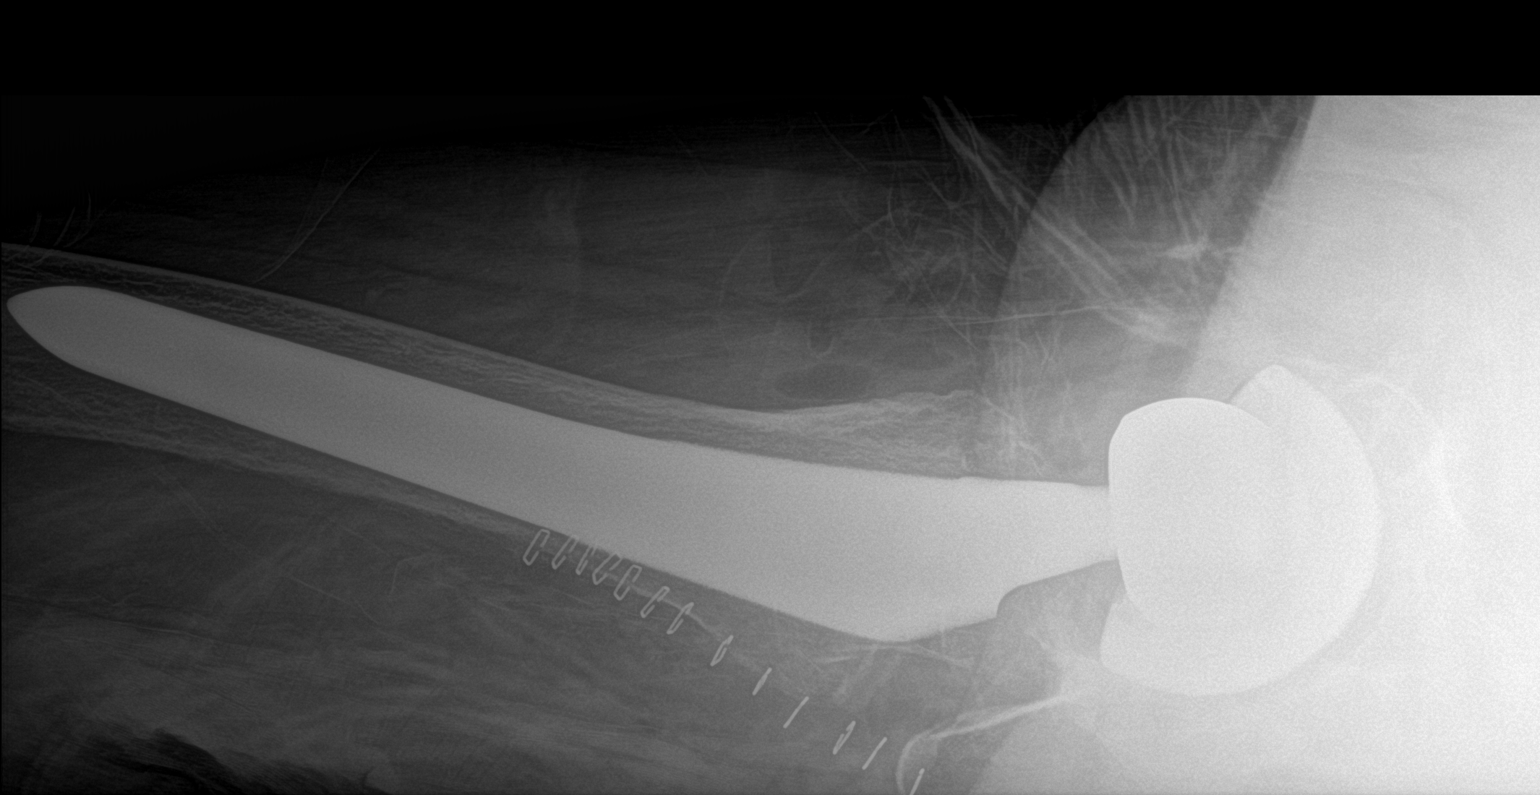

[2 of 2 positions shown; findings below may reference images not displayed]

FINDINGS: Total right hip replacement. Hardware intact. Anatomic alignment. No
acute bony abnormality.
IMPRESSION: Total right hip replacement anatomic alignment.

## 2020-08-10 DIAGNOSIS — L578 Other skin changes due to chronic exposure to nonionizing radiation: Secondary | ICD-10-CM | POA: Diagnosis not present

## 2020-08-10 DIAGNOSIS — L821 Other seborrheic keratosis: Secondary | ICD-10-CM | POA: Diagnosis not present

## 2020-08-10 DIAGNOSIS — Z85820 Personal history of malignant melanoma of skin: Secondary | ICD-10-CM | POA: Diagnosis not present

## 2021-01-24 ENCOUNTER — Ambulatory Visit: Payer: Self-pay | Admitting: Family Medicine

## 2021-01-24 ENCOUNTER — Ambulatory Visit (INDEPENDENT_AMBULATORY_CARE_PROVIDER_SITE_OTHER): Payer: Medicare HMO | Admitting: Family Medicine

## 2021-01-24 ENCOUNTER — Ambulatory Visit: Payer: Medicare HMO | Admitting: Family Medicine

## 2021-01-24 ENCOUNTER — Encounter: Payer: Self-pay | Admitting: Family Medicine

## 2021-01-24 ENCOUNTER — Other Ambulatory Visit: Payer: Self-pay

## 2021-01-24 VITALS — BP 150/100 | HR 102 | Ht 64.0 in | Wt 168.0 lb

## 2021-01-24 DIAGNOSIS — M810 Age-related osteoporosis without current pathological fracture: Secondary | ICD-10-CM

## 2021-01-24 DIAGNOSIS — E785 Hyperlipidemia, unspecified: Secondary | ICD-10-CM

## 2021-01-24 MED ORDER — ALENDRONATE SODIUM 70 MG PO TABS
ORAL_TABLET | ORAL | 1 refills | Status: DC
Start: 2021-01-24 — End: 2021-07-26

## 2021-01-24 NOTE — Progress Notes (Signed)
Date:  01/24/2021   Name:  Becky Shea [facility]   DOB:  May 02, 1931   MRN:  [id number]   Chief Complaint: Osteoporosis  Patient is a 87 year old female who presents for a evaluation exam. The patient reports the following problems: osteoporosis. Health maintenance has been reviewed up to date.     Lab Results  Component Value Date   NA 140 07/24/2020   K 5.0 07/24/2020   CO2 21 07/24/2020   GLUCOSE 99 07/24/2020   BUN 11 07/24/2020   CREATININE 0.72 07/24/2020   CALCIUM 10.0 07/24/2020   EGFR 81 07/24/2020   GFRNONAA >60 07/16/2018   Lab Results  Component Value Date   CHOL 140 07/23/2011   HDL 40 07/23/2011   LDLCALC 89 07/23/2011   TRIG 56 07/23/2011   No results found for: TSH Lab Results  Component Value Date   HGBA1C 5.7 07/23/2011   Lab Results  Component Value Date   WBC 15.9 (H) 07/16/2018   HGB 9.8 (L) 07/16/2018   HCT 29.8 (L) 07/16/2018   MCV 93.7 07/16/2018   PLT 212 07/16/2018   Lab Results  Component Value Date   ALT 17 02/09/2020   AST 21 02/09/2020   ALKPHOS 73 02/09/2020   BILITOT 0.3 02/09/2020   No results found for: Davy Pique, VD25OH   Review of Systems  Patient Active Problem List   Diagnosis Date Noted   Rotator cuff tendinitis, right 12/27/2018   History of malignant melanoma of skin 10/22/2018   Status post total hip replacement, right 07/13/2018   Sebaceous cyst 04/09/2018   Lumbar spondylosis 03/17/2018   Primary osteoarthritis of right hip 03/17/2018   Primary osteoarthritis of both knees 12/27/2014    Allergies  Allergen Reactions   Poison Ivy Extract Rash   Poison Oak Extract Rash   Poison Sumac Extract Rash    Past Surgical History:  Procedure Laterality Date   CATARACT EXTRACTION, BILATERAL     NO PAST SURGERIES     TOTAL HIP ARTHROPLASTY Right 07/13/2018   Procedure: TOTAL HIP ARTHROPLASTY;  Surgeon: Corky Mull, MD;  Location: [facility] ORS;  Service: Orthopedics;  Laterality: Right;    Social  History   Tobacco Use   Smoking status: Never   Smokeless tobacco: Never  Vaping Use   Vaping Use: Never used  Substance Use Topics   Alcohol use: Yes    Comment: occasional   Drug use: No     Medication list has been reviewed and updated.  No outpatient medications have been marked as taking for the 01/24/21 encounter (Office Visit) with [name], MD.    [facility] 2/9 Scores 07/24/2020 05/07/2020 02/09/2020 05/04/2019  PHQ - 2 Score 0 0 0 0  PHQ- 9 Score 0 - 0 -    GAD 7 : Generalized Anxiety Score 07/24/2020  Nervous, Anxious, on Edge 0  Control/stop worrying 0  Worry too much - different things 0  Trouble relaxing 0  Restless 0  Easily annoyed or irritable 0  Afraid - awful might happen 0  Total GAD 7 Score 0    BP Readings from Last 3 Encounters:  01/24/21 (!) 162/112  07/24/20 136/78  02/09/20 130/82    Physical Exam  Wt Readings from Last 3 Encounters:  01/24/21 168 lb (76.2 kg)  07/24/20 166 lb (75.3 kg)  02/09/20 170 lb (77.1 kg)    BP (!) 162/112    Pulse (!) 102    Ht 5'  4" (1.626 m)    Wt 168 lb (76.2 kg)    BMI 28.84 kg/m   Assessment and Plan:  1. Age-related osteoporosis without current pathological fracture Chronic.  Controlled.  Continue Fosamax 70 mg 1 weekly. - alendronate (FOSAMAX) 70 MG tablet; TAKE 1 TABLET BY MOUTH EVERY 7 (SEVEN) DAYS WITH A FULL GLASS OF WATER ON AN EMPTY STOMACH.  Dispense: 12 tablet; Refill: 1  2. Dyslipidemia Patient had low HDL and a previous lipid 9 years ago.  We will check a lipid panel. - Lipid Panel With LDL/HDL Ratio

## 2021-01-24 NOTE — Patient Instructions (Signed)

## 2021-01-25 DIAGNOSIS — E785 Hyperlipidemia, unspecified: Secondary | ICD-10-CM | POA: Diagnosis not present

## 2021-01-26 LAB — LIPID PANEL WITH LDL/HDL RATIO
Cholesterol, Total: 241 mg/dL — ABNORMAL HIGH (ref 100–199)
HDL: 47 mg/dL (ref >39)
LDL Chol Calc (NIH): 168 mg/dL — ABNORMAL HIGH (ref 0–99)
LDL/HDL Ratio: 3.6 ratio — ABNORMAL HIGH (ref 0.0–3.2)
Triglycerides: 142 mg/dL (ref 0–149)
VLDL Cholesterol Cal: 26 mg/dL (ref 5–40)

## 2021-02-08 DIAGNOSIS — L578 Other skin changes due to chronic exposure to nonionizing radiation: Secondary | ICD-10-CM | POA: Diagnosis not present

## 2021-02-08 DIAGNOSIS — Z85820 Personal history of malignant melanoma of skin: Secondary | ICD-10-CM | POA: Diagnosis not present

## 2021-02-08 DIAGNOSIS — D225 Melanocytic nevi of trunk: Secondary | ICD-10-CM | POA: Diagnosis not present

## 2021-02-08 DIAGNOSIS — L720 Epidermal cyst: Secondary | ICD-10-CM | POA: Diagnosis not present

## 2021-04-29 DIAGNOSIS — K029 Dental caries, unspecified: Secondary | ICD-10-CM | POA: Diagnosis not present

## 2021-04-29 DIAGNOSIS — R69 Illness, unspecified: Secondary | ICD-10-CM | POA: Diagnosis not present

## 2021-05-08 ENCOUNTER — Ambulatory Visit: Payer: Medicare HMO

## 2021-05-20 ENCOUNTER — Ambulatory Visit (INDEPENDENT_AMBULATORY_CARE_PROVIDER_SITE_OTHER): Payer: Medicare HMO

## 2021-05-20 DIAGNOSIS — Z0000 Encounter for general adult medical examination without abnormal findings: Secondary | ICD-10-CM | POA: Diagnosis not present

## 2021-05-20 NOTE — Progress Notes (Signed)
? ?Subjective:  ? Cementon is a 88 y.o. female who presents for Medicare Annual (Subsequent) preventive examination. ? ?Virtual Visit via Telephone Note ? ?I connected with  Bettey Costa on 05/20/21 at  2:15 PM EDT by telephone and verified that I am speaking with the correct person using two identifiers. ? ?Location: ?Patient: home ?Provider: [facility] ?Persons participating in the virtual visit: patient & daughter Hope Pigeon Health Advisor ?  ?I discussed the limitations, risks, security and privacy concerns of performing an evaluation and management service by telephone and the availability of in person appointments. The patient expressed understanding and agreed to proceed. ? ?Interactive audio and video telecommunications were attempted between this nurse and patient, however failed, due to patient having technical difficulties OR patient did not have access to video capability.  We continued and completed visit with audio only. ? ?Some vital signs may be absent or patient reported.  ? ?Clemetine Marker, LPN ? ? ?Review of Systems    ? ?Cardiac Risk Factors include: advanced age (>8mn, >>65women) ? ?   ?Objective:  ?  ?There were no vitals filed for this visit. ?There is no height or weight on file to calculate BMI. ? ? ?  05/20/2021  ?  2:23 PM 05/07/2020  ? 11:33 AM 05/04/2019  ?  9:40 AM 07/13/2018  ? 11:07 AM 07/13/2018  ?  6:48 AM 02/08/2018  ? 12:17 PM 10/13/2016  ?  9:45 AM  ?Advanced Directives  ?Does Patient Have a Medical Advance Directive? Yes Yes Yes Yes Yes No Yes  ?Type of AParamedicof AMadison CenterLiving will HHailesboroLiving will HKah[place]uuLiving will HGranite QuarryLiving will Healthcare Power of Attorney    ?Does patient want to make changes to medical advance directive?    No - Patient declined No - Patient declined    ?Copy of [place] Chart? No - copy requested No - copy requested No -  copy requested No - copy requested No - copy requested    ? ? ?Current Medications (verified) ?Outpatient Encounter Medications as of 05/20/2021  ?Medication Sig  ? acetaminophen (TYLENOL) 500 MG tablet Take 1,000 mg by mouth every 6 (six) hours as needed for moderate pain.   ? alendronate (FOSAMAX) 70 MG tablet TAKE 1 TABLET BY MOUTH EVERY 7 (SEVEN) DAYS WITH A FULL GLASS OF WATER ON AN EMPTY STOMACH.  ? AZO-CRANBERRY PO Take 2 tablets by mouth daily before breakfast.  ? diphenhydramine-acetaminophen (TYLENOL PM EXTRA STRENGTH) 25-500 MG TABS tablet Take 1 tablet by mouth at bedtime as needed.  ? KRILL OIL PO Take 350 mg by mouth daily.  ? Magnesium Gluconate 250 MG TABS Take 1 tablet by mouth daily.  ? Melatonin 3 MG TABS Take 1 tablet by mouth at bedtime.  ? Misc Natural Products (OSTEO BI-FLEX ADV DOUBLE ST PO) Take 2 tablets by mouth daily.  ? Multiple Vitamin (MULTI-VITAMIN) tablet Take 1 tablet by mouth daily.  ? Multiple Vitamins-Minerals (PRESERVISION AREDS 2+MULTI VIT PO) Take by mouth.  ? vitamin B-12 (CYANOCOBALAMIN) 1000 MCG tablet Take 1,000 mcg by mouth daily.  ? gabapentin (NEURONTIN) 100 MG capsule Take 100 mg by mouth at bedtime. Take 1 capsule (100 mg total) by mouth nightly- Dr Tellhon/ spine center  ? ?No facility-administered encounter medications on file as of 05/20/2021.  ? ? ?Allergies (verified) ?Poison ivy extract, Poison oak extract, and Poison sumac extract  ? ?History: ?  Past Medical History:  ?Diagnosis Date  ? Arthritis   ? GERD (gastroesophageal reflux disease)   ? rare but no meds  ? HOH (hard of hearing)   ? wears bilateral hearing aides  ? ?Past Surgical History:  ?Procedure Laterality Date  ? CATARACT EXTRACTION, BILATERAL    ? NO PAST SURGERIES    ? TOTAL HIP ARTHROPLASTY Right 07/13/2018  ? Procedure: TOTAL HIP ARTHROPLASTY;  Surgeon: Corky Mull, MD;  Location: [facility] ORS;  Service: Orthopedics;  Laterality: Right;  ? ?History reviewed. No pertinent family history. ?Social History   ? ?Socioeconomic History  ? Marital status: Widowed  ?  Spouse name: Not on file  ? Number of children: Not on file  ? Years of education: Not on file  ? Highest education level: Not on file  ?Occupational History  ? 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 Smoking status: Never  ? Smokeless tobacco: Never  ?Vaping Use  ? Vaping Use: Never used  ?Substance and Sexual Activity  ? Alcohol use: Yes  ?  Comment: occasional  ? Drug use: No  ? Sexual activity: Never  ?Other Topics Concern  ? Not on file  ?Social History Narrative  ? Pt lives alone but daughter [name] comes by daily  ? ?Social Determinants of Health  ? ?Financial Resource Strain: Low Risk   ? Difficulty of Paying Living Expenses: Not hard at all  ?Food Insecurity: No Food Insecurity  ? Worried About Charity fundraiser in the Last Year: Never true  ? Ran Out of Food in the Last Year: Never true  ?Transportation Needs: No Transportation Needs  ? Lack of Transportation (Medical): No  ? Lack of Transportation (Non-Medical): No  ?Physical Activity: Insufficiently Active  ? Days of Exercise per Week: 2 days  ? Minutes of Exercise per Session: 60 min  ?Stress: No Stress Concern Present  ? Feeling of Stress : Not at all  ?Social Connections: Socially Isolated  ? Frequency of Communication with Friends and Family: More than three times a week  ? Frequency of Social Gatherings with Friends and Family: More than three times a week  ? Attends Religious Services: Never  ? Active Member of Clubs or Organizations: No  ? Attends Archivist Meetings: Never  ? Marital Status: Widowed  ? ? ?Tobacco Counseling ?Counseling given: Not Answered ? ? ?Clinical Intake: ? ?Pre-visit preparation completed: Yes ? ?Pain : No/denies pain ? ?  ? ?Nutritional Risks: None ?Diabetes: No ? ?How often do you need to have someone help you when you read instructions, pamphlets, or other written materials from your doctor or pharmacy?: 1 -  Never ? ? ? ?Interpreter Needed?: No ? ?Information entered by :: Clemetine Marker LPN ? ? ?Activities of Daily Living ? ?  05/20/2021  ?  2:25 PM  ?In your present state of health, do you have any difficulty performing the following activities:  ?Hearing? 1  ?Vision? 0  ?Difficulty concentrating or making decisions? 0  ?Walking or climbing stairs? 0  ?Dressing or bathing? 0  ?Doing errands, shopping? 0  ?Preparing Food and eating ? N  ?Using the Toilet? N  ?In the past six months, have you accidently leaked urine? N  ?Do you have problems with loss of bowel control? N  ?Managing your Medications? Y  ?Managing your Finances? Y  ?Housekeeping or managing your Housekeeping? N  ? ? ?Patient Care Team: ?[name],  MD as PCP - General (Family Medicine) ? ?Indicate any recent Medical Services you may have received from other than [facility] providers in the past year (date may be approximate). ? ?   ?Assessment:  ? This is a routine wellness examination for Tempie. ? ?Hearing/Vision screen ?Hearing Screening - Comments:: Pt wears hearing aids  ?Vision Screening - Comments:: Annual vision screenings done by Dr[facility] Tempie Donning at [facility] ? ?Dietary issues and exercise activities discussed: ?Current Exercise Habits: Structured exercise class, Time (Minutes): 60, Frequency (Times/Week): 2, Weekly Exercise (Minutes/Week): 120, Intensity: Moderate, Exercise limited by: Other - see comments (water aerobics) ? ? Goals Addressed   ? ?  ?  ?  ?  ? This Visit's Progress  ?  DIET - INCREASE WATER INTAKE   On track  ?  Recommend drinking 6-8 glasses of water per day ? ?  ? ?  ? ?Depression Screen ? ?  05/20/2021  ?  2:23 PM 07/24/2020  ?  1:37 PM 05/07/2020  ? 11:32 AM 02/09/2020  ?  1:30 PM 05/04/2019  ?  9:33 AM 05/04/2019  ?  9:32 AM 04/09/2018  ?  2:22 PM  ?PHQ 2/9 Scores  ?PHQ - 2 Score 0 0 0 0 0 0 0  ?PHQ- 9 Score  0  0   0  ?  ?Fall Risk ? ?  05/20/2021  ?  2:25 PM 05/07/2020  ? 11:35 AM 05/04/2019  ?  9:30 AM  ?Fall Risk   ?Falls in the  past year? 0 0 0  ?Number falls in past yr: 0 0 0  ?Injury with Fall? 0 0 0  ?Risk for fall due to : No Fall Risks Impaired mobility;Impaired balance/gait;No Fall Risks Impaired balance/gait  ?Follow up Falls pre

## 2021-05-20 NOTE — Patient Instructions (Signed)
Becky Shea , ?Thank you for taking time to come for your Medicare Wellness Visit. I appreciate your ongoing commitment to your health goals. Please review the following plan we discussed and let me know if I can assist you in the future.  ? ?Screening recommendations/referrals: ?Colonoscopy: no longer required ?Mammogram: no longer required ?Bone Density: no longer required ?Recommended yearly ophthalmology/optometry visit for glaucoma screening and checkup ?Recommended yearly dental visit for hygiene and checkup ? ?Vaccinations: ?Influenza vaccine: done 12/16/20 ?Pneumococcal vaccine: done 10/23/18 ?Tdap vaccine: due ?Shingles vaccine: done 01/05/21. Due for second dose.    ?Covid-19:done 03/21/19, 04/12/19 & 12/14/19 ? ?Advanced directives: Please bring a copy of your health care power of attorney and living will to the office at your convenience.  ? ?Conditions/risks identified: Keep up the great work! ? ?Next appointment: Follow up in one year for your annual wellness visit  ? ? ?Preventive Care 19 Years and Older, Female ?Preventive care refers to lifestyle choices and visits with your health care provider that can promote health and wellness. ?What does preventive care include? ?A yearly physical exam. This is also called an annual well check. ?Dental exams once or twice a year. ?Routine eye exams. Ask your health care provider how often you should have your eyes checked. ?Personal lifestyle choices, including: ?Daily care of your teeth and gums. ?Regular physical activity. ?Eating a healthy diet. ?Avoiding tobacco and drug use. ?Limiting alcohol use. ?Practicing safe sex. ?Taking low-dose aspirin every day. ?Taking vitamin and mineral supplements as recommended by your health care provider. ?What happens during an annual well check? ?The services and screenings done by your health care provider during your annual well check will depend on your age, overall health, lifestyle risk factors, and family history of  disease. ?Counseling  ?Your health care provider may ask you questions about your: ?Alcohol use. ?Tobacco use. ?Drug use. ?Emotional well-being. ?Home and relationship well-being. ?Sexual activity. ?Eating habits. ?History of falls. ?Memory and ability to understand (cognition). ?Work and work Statistician. ?Reproductive health. ?Screening  ?You may have the following tests or measurements: ?Height, weight, and BMI. ?Blood pressure. ?Lipid and cholesterol levels. These may be checked every 5 years, or more frequently if you are over 6 years old. ?Skin check. ?Lung cancer screening. You may have this screening every year starting at age 32 if you have a 30-pack-year history of smoking and currently smoke or have quit within the past 15 years. ?Fecal occult blood test (FOBT) of the stool. You may have this test every year starting at age 8. ?Flexible sigmoidoscopy or colonoscopy. You may have a sigmoidoscopy every 5 years or a colonoscopy every 10 years starting at age 48. ?Hepatitis C blood test. ?Hepatitis B blood test. ?Sexually transmitted disease (STD) testing. ?Diabetes screening. This is done by checking your blood sugar (glucose) after you have not eaten for a while (fasting). You may have this done every 1-3 years. ?Bone density scan. This is done to screen for osteoporosis. You may have this done starting at age 59. ?Mammogram. This may be done every 1-2 years. Talk to your health care provider about how often you should have regular mammograms. ?Talk with your health care provider about your test results, treatment options, and if necessary, the need for more tests. ?Vaccines  ?Your health care provider may recommend certain vaccines, such as: ?Influenza vaccine. This is recommended every year. ?Tetanus, diphtheria, and acellular pertussis (Tdap, Td) vaccine. You may need a Td booster every 10 years. ?Zoster vaccine.  You may need this after age 69. ?Pneumococcal 13-valent conjugate (PCV13) vaccine. One  dose is recommended after age 78. ?Pneumococcal polysaccharide (PPSV23) vaccine. One dose is recommended after age 91. ?Talk to your health care provider about which screenings and vaccines you need and how often you need them. ?This information is not intended to replace advice given to you by your health care provider. Make sure you discuss any questions you have with your health care provider. ?Document Released: 02/09/2015 Document Revised: 10/03/2015 Document Reviewed: 11/14/2014 ?Elsevier Interactive Patient Education ? 2017 [place]. ? ?Fall Prevention in the Home ?Falls can cause injuries. They can happen to people of all ages. There are many things you can do to make your home safe and to help prevent falls. ?What can I do on the outside of my home? ?Regularly fix the edges of walkways and driveways and fix any cracks. ?Remove anything that might make you trip as you walk through a door, such as a raised step or threshold. ?Trim any bushes or trees on the path to your home. ?Use bright outdoor lighting. ?Clear any walking paths of anything that might make someone trip, such as rocks or tools. ?Regularly check to see if handrails are loose or broken. Make sure that both sides of any steps have handrails. ?Any raised decks and porches should have guardrails on the edges. ?Have any leaves, snow, or ice cleared regularly. ?Use sand or salt on walking paths during winter. ?Clean up any spills in your garage right away. This includes oil or grease spills. ?What can I do in the bathroom? ?Use night lights. ?Install grab bars by the toilet and in the tub and shower. Do not use towel bars as grab bars. ?Use non-skid mats or decals in the tub or shower. ?If you need to sit down in the shower, use a plastic, non-slip stool. ?Keep the floor dry. Clean up any water that spills on the floor as soon as it happens. ?Remove soap buildup in the tub or shower regularly. ?Attach bath mats securely with double-sided  non-slip rug tape. ?Do not have throw rugs and other things on the floor that can make you trip. ?What can I do in the bedroom? ?Use night lights. ?Make sure that you have a light by your bed that is easy to reach. ?Do not use any sheets or blankets that are too big for your bed. They should not hang down onto the floor. ?Have a firm chair that has side arms. You can use this for support while you get dressed. ?Do not have throw rugs and other things on the floor that can make you trip. ?What can I do in the kitchen? ?Clean up any spills right away. ?Avoid walking on wet floors. ?Keep items that you use a lot in easy-to-reach places. ?If you need to reach something above you, use a strong step stool that has a grab bar. ?Keep electrical cords out of the way. ?Do not use floor polish or wax that makes floors slippery. If you must use wax, use non-skid floor wax. ?Do not have throw rugs and other things on the floor that can make you trip. ?What can I do with my stairs? ?Do not leave any items on the stairs. ?Make sure that there are handrails on both sides of the stairs and use them. Fix handrails that are broken or loose. Make sure that handrails are as long as the stairways. ?Check any carpeting to make sure that it is  firmly attached to the stairs. Fix any carpet that is loose or worn. ?Avoid having throw rugs at the top or bottom of the stairs. If you do have throw rugs, attach them to the floor with carpet tape. ?Make sure that you have a light switch at the top of the stairs and the bottom of the stairs. If you do not have them, ask someone to add them for you. ?What else can I do to help prevent falls? ?Wear shoes that: ?Do not have high heels. ?Have rubber bottoms. ?Are comfortable and fit you well. ?Are closed at the toe. Do not wear sandals. ?If you use a stepladder: ?Make sure that it is fully opened. Do not climb a closed stepladder. ?Make sure that both sides of the stepladder are locked into place. ?Ask  someone to hold it for you, if possible. ?Clearly mark and make sure that you can see: ?Any grab bars or handrails. ?First and last steps. ?Where the edge of each step is. ?Use tools that help you move around (mo

## 2021-05-24 ENCOUNTER — Other Ambulatory Visit: Payer: Self-pay

## 2021-05-24 ENCOUNTER — Encounter: Payer: Self-pay | Admitting: Family Medicine

## 2021-05-24 ENCOUNTER — Telehealth: Payer: Self-pay | Admitting: Family Medicine

## 2021-05-24 MED ORDER — GABAPENTIN 100 MG PO CAPS: 100.0000 mg | ORAL_CAPSULE | Freq: Every day | ORAL | 0 refills | Status: AC

## 2021-05-24 NOTE — Telephone Encounter (Signed)
Copied from [place]. Topic: General - Other ?>> May 24, 2021  4:23 PM Antonieta Iba C wrote: ?Reason for CRM: the spine center called in returning [place] call. Caller says that she is unsure of why Baxter Flattery called  ? ?Please follow up ?

## 2021-05-27 ENCOUNTER — Telehealth: Payer: Self-pay

## 2021-05-27 NOTE — Telephone Encounter (Signed)
Called [facility]- had to leave another message for them to return call. I need to speak to them/ [facility] has been notified to get me to the phone ?

## 2021-05-27 NOTE — Telephone Encounter (Signed)
Spoke to [employer] at [facility] and explained that we sent in a 30 day supply of gabapentin, but will be unable to presribe after that. She is going to pass the message to Dr Colbert Coyer and add pt to cancellation list. I also sent pt message to Ursula/ daughter ?

## 2021-06-05 DIAGNOSIS — H5203 Hypermetropia, bilateral: Secondary | ICD-10-CM | POA: Diagnosis not present

## 2021-06-05 DIAGNOSIS — H353131 Nonexudative age-related macular degeneration, bilateral, early dry stage: Secondary | ICD-10-CM | POA: Diagnosis not present

## 2021-07-26 ENCOUNTER — Ambulatory Visit (INDEPENDENT_AMBULATORY_CARE_PROVIDER_SITE_OTHER): Payer: Medicare HMO | Admitting: Family Medicine

## 2021-07-26 ENCOUNTER — Ambulatory Visit: Payer: Medicare HMO | Admitting: Family Medicine

## 2021-07-26 ENCOUNTER — Encounter: Payer: Self-pay | Admitting: Family Medicine

## 2021-07-26 VITALS — BP 120/80 | HR 60 | Ht 64.0 in | Wt 160.0 lb

## 2021-07-26 DIAGNOSIS — R5383 Other fatigue: Secondary | ICD-10-CM | POA: Diagnosis not present

## 2021-07-26 DIAGNOSIS — Z862 Personal history of diseases of the blood and blood-forming organs and certain disorders involving the immune mechanism: Secondary | ICD-10-CM | POA: Diagnosis not present

## 2021-07-26 DIAGNOSIS — E785 Hyperlipidemia, unspecified: Secondary | ICD-10-CM

## 2021-07-26 DIAGNOSIS — M810 Age-related osteoporosis without current pathological fracture: Secondary | ICD-10-CM | POA: Diagnosis not present

## 2021-07-26 MED ORDER — ALENDRONATE SODIUM 70 MG PO TABS: ORAL_TABLET | ORAL | 1 refills | Status: DC

## 2021-07-26 NOTE — Patient Instructions (Signed)

## 2021-07-26 NOTE — Progress Notes (Signed)
Date:  07/26/2021   Name:  Becky Shea [facility]   DOB:  26-Aug-1931   MRN:  [id number]   Chief Complaint: Osteoporosis  Patient is a 88 year old female who presents for a osteoporosis exam. The patient reports the following problems: none. Health maintenance has been reviewed up to date.      Lab Results  Component Value Date   NA 140 07/24/2020   K 5.0 07/24/2020   CO2 21 07/24/2020   GLUCOSE 99 07/24/2020   BUN 11 07/24/2020   CREATININE 0.72 07/24/2020   CALCIUM 10.0 07/24/2020   EGFR 81 07/24/2020   GFRNONAA >60 07/16/2018   Lab Results  Component Value Date   CHOL 241 (H) 01/25/2021   HDL 47 01/25/2021   LDLCALC 168 (H) 01/25/2021   TRIG 142 01/25/2021   No results found for: "TSH" Lab Results  Component Value Date   HGBA1C 5.7 07/23/2011   Lab Results  Component Value Date   WBC 15.9 (H) 07/16/2018   HGB 9.8 (L) 07/16/2018   HCT 29.8 (L) 07/16/2018   MCV 93.7 07/16/2018   PLT 212 07/16/2018   Lab Results  Component Value Date   ALT 17 02/09/2020   AST 21 02/09/2020   ALKPHOS 73 02/09/2020   BILITOT 0.3 02/09/2020   No results found for: "25OHVITD2", "25OHVITD3", "VD25OH"   Review of Systems  Constitutional:  Positive for fatigue and unexpected weight change. Negative for appetite change.  Respiratory:  Negative for shortness of breath and wheezing.   Cardiovascular:  Negative for chest pain and palpitations.  Gastrointestinal:  Negative for abdominal pain and blood in stool.  Endocrine: Negative for polydipsia and polyuria.  Genitourinary:  Negative for difficulty urinating.    Patient Active Problem List   Diagnosis Date Noted   Rotator cuff tendinitis, right 12/27/2018   History of malignant melanoma of skin 10/22/2018   Status post total hip replacement, right 07/13/2018   Sebaceous cyst 04/09/2018   Lumbar spondylosis 03/17/2018   Primary osteoarthritis of right hip 03/17/2018   Primary osteoarthritis of both knees 12/27/2014     Allergies  Allergen Reactions   Poison Ivy Extract Rash   Poison Oak Extract Rash   Poison Sumac Extract Rash    Past Surgical History:  Procedure Laterality Date   CATARACT EXTRACTION, BILATERAL     NO PAST SURGERIES     TOTAL HIP ARTHROPLASTY Right 07/13/2018   Procedure: TOTAL HIP ARTHROPLASTY;  Surgeon: Corky Mull, MD;  Location: [facility];  Service: Orthopedics;  Laterality: Right;    Social History   Tobacco Use   Smoking status: Never   Smokeless tobacco: Never  Vaping Use   Vaping Use: Never used  Substance Use Topics   Alcohol use: Yes    Comment: occasional   Drug use: No     Medication list has been reviewed and updated.  Current Meds  Medication Sig   acetaminophen (TYLENOL) 500 MG tablet Take 1,000 mg by mouth every 6 (six) hours as needed for moderate pain.    alendronate (FOSAMAX) 70 MG tablet TAKE 1 TABLET BY MOUTH EVERY 7 (SEVEN) DAYS WITH A FULL GLASS OF WATER ON AN EMPTY STOMACH.   AZO-CRANBERRY PO Take 2 tablets by mouth daily before breakfast.   diphenhydramine-acetaminophen (TYLENOL PM EXTRA STRENGTH) 25-500 MG TABS tablet Take 1 tablet by mouth at bedtime as needed.   gabapentin (NEURONTIN) 100 MG capsule Take 1 capsule (100 mg total) by mouth at bedtime.  Take 1 capsule (100 mg total) by mouth nightly- Dr Tellhon/ spine center   KRILL OIL PO Take 350 mg by mouth daily.   Magnesium Gluconate 250 MG TABS Take 1 tablet by mouth daily.   Melatonin 3 MG TABS Take 1 tablet by mouth at bedtime.   Misc Natural Products (OSTEO BI-FLEX ADV DOUBLE ST PO) Take 3 tablets by mouth daily.   Multiple Vitamin (MULTI-VITAMIN) tablet Take 1 tablet by mouth daily.   Multiple Vitamins-Minerals (PRESERVISION AREDS 2+MULTI VIT PO) Take by mouth.   vitamin B-12 (CYANOCOBALAMIN) 1000 MCG tablet Take 1,000 mcg by mouth daily.       07/26/2021    9:25 AM 07/24/2020    1:38 PM  GAD 7 : Generalized Anxiety Score  Nervous, Anxious, on Edge 0 0  Control/stop worrying  0 0  Worry too much - different things 0 0  Trouble relaxing 0 0  Restless 0 0  Easily annoyed or irritable 0 0  Afraid - awful might happen 0 0  Total GAD 7 Score 0 0  Anxiety Difficulty Not difficult at all        07/26/2021    9:26 AM 07/26/2021    9:25 AM 05/20/2021    2:23 PM  Depression screen PHQ 2/9  Decreased Interest 0  0  Down, Depressed, Hopeless 0  0  PHQ - 2 Score 0  0  Altered sleeping 0    Tired, decreased energy 2    Change in appetite 0 0   Feeling bad or failure about yourself  0 0   Trouble concentrating 0 0   Moving slowly or fidgety/restless 0 0   Suicidal thoughts 0 0   PHQ-9 Score 2    Difficult doing work/chores Not difficult at all      BP Readings from Last 3 Encounters:  07/26/21 120/80  01/24/21 (!) 150/100  07/24/20 136/78    Physical Exam Vitals and nursing note reviewed.  HENT:     Right Ear: Tympanic membrane and ear canal normal.     Left Ear: Tympanic membrane and ear canal normal.     Mouth/Throat:     Mouth: Mucous membranes are moist.  Eyes:     Pupils: Pupils are equal, round, and reactive to light.  Cardiovascular:     Rate and Rhythm: Normal rate.     Heart sounds: S1 normal and S2 normal. No murmur heard.    No systolic murmur is present.     No diastolic murmur is present.     No friction rub. No gallop. No S3 or S4 sounds.  Pulmonary:     Effort: Pulmonary effort is normal. No respiratory distress.     Breath sounds: Normal breath sounds. No stridor. No wheezing, rhonchi or rales.  Abdominal:     Palpations: There is no hepatomegaly or splenomegaly.     Tenderness: There is no abdominal tenderness.  Musculoskeletal:     Cervical back: Normal range of motion.  Skin:    Capillary Refill: Capillary refill takes less than 2 seconds.  Neurological:     Mental Status: She is alert.     Wt Readings from Last 3 Encounters:  07/26/21 160 lb (72.6 kg)  01/24/21 168 lb (76.2 kg)  07/24/20 166 lb (75.3 kg)    BP  120/80   Pulse 60   Ht 5' 4"  (1.626 m)   Wt 160 lb (72.6 kg)   BMI 27.46 kg/m   Assessment and Plan:  1. Age-related osteoporosis without current pathological fracture Chronic.  Persistent.  Stable.  Continue Fosamax 70 mg once a week - alendronate (FOSAMAX) 70 MG tablet; TAKE 1 TABLET BY MOUTH EVERY 7 (SEVEN) DAYS WITH A FULL GLASS OF WATER ON AN EMPTY STOMACH.  Dispense: 12 tablet; Refill: 1  2. History of anemia 3 years ago noted that patient had anemia likely secondary to blood loss with hip replacement.  We will check CBC. - CBC with Differential/Platelet  3. Dyslipidemia Elevated triglycerides and lipid in the past - Lipid Panel With LDL/HDL Ratio  4. Fatigue, unspecified type New onset.  Episodic.  Will check CMP, TSH and CBC. - Comprehensive Metabolic Panel (CMET) - TSH - CBC with Differential/Platelet

## 2021-07-27 LAB — COMPREHENSIVE METABOLIC PANEL
ALT: 9 IU/L (ref 0–32)
AST: 14 IU/L (ref 0–40)
Albumin/Globulin Ratio: 1.8 (ref 1.2–2.2)
Albumin: 4.8 g/dL — ABNORMAL HIGH (ref 3.6–4.6)
Alkaline Phosphatase: 67 IU/L (ref 44–121)
BUN/Creatinine Ratio: 17 (ref 12–28)
BUN: 11 mg/dL (ref 8–27)
Bilirubin Total: 0.4 mg/dL (ref 0.0–1.2)
CO2: 23 mmol/L (ref 20–29)
Calcium: 9.5 mg/dL (ref 8.7–10.3)
Chloride: 95 mmol/L — ABNORMAL LOW (ref 96–106)
Creatinine, Ser: 0.65 mg/dL (ref 0.57–1.00)
Globulin, Total: 2.7 g/dL (ref 1.5–4.5)
Glucose: 108 mg/dL — ABNORMAL HIGH (ref 70–99)
Potassium: 4.4 mmol/L (ref 3.5–5.2)
Sodium: 132 mmol/L — ABNORMAL LOW (ref 134–144)
Total Protein: 7.5 g/dL (ref 6.0–8.5)
eGFR: 85 mL/min/{1.73_m2} (ref >59)

## 2021-07-27 LAB — CBC WITH DIFFERENTIAL/PLATELET
Basophils Absolute: 0 10*3/uL (ref 0.0–0.2)
Basos: 1 %
EOS (ABSOLUTE): 0.2 10*3/uL (ref 0.0–0.4)
Eos: 3 %
Hematocrit: 42.6 % (ref 34.0–46.6)
Hemoglobin: 14.1 g/dL (ref 11.1–15.9)
Immature Grans (Abs): 0 10*3/uL (ref 0.0–0.1)
Immature Granulocytes: 0 %
Lymphocytes Absolute: 2 10*3/uL (ref 0.7–3.1)
Lymphs: 32 %
MCH: 30.8 pg (ref 26.6–33.0)
MCHC: 33.1 g/dL (ref 31.5–35.7)
MCV: 93 fL (ref 79–97)
Monocytes Absolute: 0.7 10*3/uL (ref 0.1–0.9)
Monocytes: 12 %
Neutrophils Absolute: 3.4 10*3/uL (ref 1.4–7.0)
Neutrophils: 52 %
Platelets: 294 10*3/uL (ref 150–450)
RBC: 4.58 x10E6/uL (ref 3.77–5.28)
RDW: 12.6 % (ref 11.7–15.4)
WBC: 6.3 10*3/uL (ref 3.4–10.8)

## 2021-07-27 LAB — LIPID PANEL WITH LDL/HDL RATIO
Cholesterol, Total: 247 mg/dL — ABNORMAL HIGH (ref 100–199)
HDL: 50 mg/dL (ref >39)
LDL Chol Calc (NIH): 169 mg/dL — ABNORMAL HIGH (ref 0–99)
LDL/HDL Ratio: 3.4 ratio — ABNORMAL HIGH (ref 0.0–3.2)
Triglycerides: 151 mg/dL — ABNORMAL HIGH (ref 0–149)
VLDL Cholesterol Cal: 28 mg/dL (ref 5–40)

## 2021-07-27 LAB — TSH: TSH: 1.99 u[IU]/mL (ref 0.450–4.500)

## 2021-08-09 DIAGNOSIS — D225 Melanocytic nevi of trunk: Secondary | ICD-10-CM | POA: Diagnosis not present

## 2021-08-09 DIAGNOSIS — D1801 Hemangioma of skin and subcutaneous tissue: Secondary | ICD-10-CM | POA: Diagnosis not present

## 2021-08-09 DIAGNOSIS — L570 Actinic keratosis: Secondary | ICD-10-CM | POA: Diagnosis not present

## 2021-08-09 DIAGNOSIS — Z85820 Personal history of malignant melanoma of skin: Secondary | ICD-10-CM | POA: Diagnosis not present

## 2021-08-09 DIAGNOSIS — L608 Other nail disorders: Secondary | ICD-10-CM | POA: Diagnosis not present

## 2021-08-09 DIAGNOSIS — L578 Other skin changes due to chronic exposure to nonionizing radiation: Secondary | ICD-10-CM | POA: Diagnosis not present

## 2021-09-03 ENCOUNTER — Encounter: Payer: Self-pay | Admitting: Family Medicine

## 2021-12-09 ENCOUNTER — Ambulatory Visit (INDEPENDENT_AMBULATORY_CARE_PROVIDER_SITE_OTHER): Payer: Medicare HMO

## 2021-12-09 ENCOUNTER — Ambulatory Visit
Admission: EM | Admit: 2021-12-09 | Discharge: 2021-12-09 | Disposition: A | Discharge status: Home / Self Care | Payer: Medicare HMO | Attending: Emergency Medicine | Admitting: Emergency Medicine

## 2021-12-09 DIAGNOSIS — S7002XA Contusion of left hip, initial encounter: Secondary | ICD-10-CM

## 2021-12-09 DIAGNOSIS — W19XXXA Unspecified fall, initial encounter: Secondary | ICD-10-CM

## 2021-12-09 DIAGNOSIS — M25552 Pain in left hip: Secondary | ICD-10-CM

## 2021-12-09 DIAGNOSIS — M1612 Unilateral primary osteoarthritis, left hip: Secondary | ICD-10-CM | POA: Diagnosis not present

## 2021-12-09 NOTE — ED Triage Notes (Addendum)
Patient reports that she fell yesterday while she was outside. She reports that she fell on her left side and hit her left elbow and left hip. Patient reports that it hurts to walk.

## 2021-12-09 NOTE — ED Provider Notes (Signed)
HPI  SUBJECTIVE:  Becky Shea is a 88 y.o. female who presents with left hip pain, swelling, bruising after having a mechanical fall yesterday where her walker turned out from under her, and she fell onto her left side.  She denies preceding chest pain, shortness of breath, palpitations, syncope causing the fall.  She denies head trauma, neck pain.  No distal numbness or tingling, limitation of motion of the left hip.  No injury to the knee.  She tried ibuprofen 400 mg twice, Tylenol 500 mg twice daily.  Symptoms worse with walking.  She is barely able to weight-bear on this leg.  Symptoms are better with sitting, lying down.  No antipyretic in the past 6 hours.  She has a past medical history of osteoporosis.  No history of chronic kidney disease, diabetes, hypertension, anticoagulant/anticoagulant use.  PCP: [facility].  Additional history obtained from daughter.  Past Medical History:  Diagnosis Date   Arthritis    GERD (gastroesophageal reflux disease)    rare but no meds   HOH (hard of hearing)    wears bilateral hearing aides    Past Surgical History:  Procedure Laterality Date   CATARACT EXTRACTION, BILATERAL     NO PAST SURGERIES     TOTAL HIP ARTHROPLASTY Right 07/13/2018   Procedure: TOTAL HIP ARTHROPLASTY;  Surgeon: Corky Mull, MD;  Location: [facility];  Service: Orthopedics;  Laterality: Right;    History reviewed. No pertinent family history.  Social History   Tobacco Use   Smoking status: Never   Smokeless tobacco: Never  Vaping Use   Vaping Use: Never used  Substance Use Topics   Alcohol use: Yes    Comment: occasional   Drug use: No    No current facility-administered medications for this encounter.  Current Outpatient Medications:    acetaminophen (TYLENOL) 500 MG tablet, Take 1,000 mg by mouth every 6 (six) hours as needed for moderate pain. , Disp: , Rfl:    alendronate (FOSAMAX) 70 MG tablet, TAKE 1 TABLET BY MOUTH EVERY 7 (SEVEN)  DAYS WITH A FULL GLASS OF WATER ON AN EMPTY STOMACH., Disp: 12 tablet, Rfl: 1   AZO-CRANBERRY PO, Take 2 tablets by mouth daily before breakfast., Disp: , Rfl:    diphenhydramine-acetaminophen (TYLENOL PM EXTRA STRENGTH) 25-500 MG TABS tablet, Take 1 tablet by mouth at bedtime as needed., Disp: , Rfl:    gabapentin (NEURONTIN) 100 MG capsule, Take 1 capsule (100 mg total) by mouth at bedtime. Take 1 capsule (100 mg total) by mouth nightly- Dr Tellhon/ spine center, Disp: 30 capsule, Rfl: 0   KRILL OIL PO, Take 350 mg by mouth daily., Disp: , Rfl:    Magnesium Gluconate 250 MG TABS, Take 1 tablet by mouth daily., Disp: , Rfl:    Melatonin 3 MG TABS, Take 1 tablet by mouth at bedtime., Disp: , Rfl:    Misc Natural Products (OSTEO BI-FLEX ADV DOUBLE ST PO), Take 3 tablets by mouth daily., Disp: , Rfl:    Multiple Vitamin (MULTI-VITAMIN) tablet, Take 1 tablet by mouth daily., Disp: , Rfl:    Multiple Vitamins-Minerals (PRESERVISION AREDS 2+MULTI VIT PO), Take by mouth., Disp: , Rfl:    vitamin B-12 (CYANOCOBALAMIN) 1000 MCG tablet, Take 1,000 mcg by mouth daily., Disp: , Rfl:   Allergies  Allergen Reactions   Poison Ivy Extract Rash   Poison Oak Extract Rash   Poison Sumac Extract Rash     ROS  As noted in HPI.  Physical Exam  BP (!) 171/80 (BP Location: Right Arm)   Pulse 95   Temp 98.2 F (36.8 C) (Oral)   Ht '5\' 4"'$  (1.626 m)   Wt 72.6 kg   SpO2 98%   BMI 27.47 kg/m   Constitutional: Well developed, well nourished, no acute distress Eyes:  EOMI, conjunctiva normal bilaterally HENT: Normocephalic, atraumatic,mucus membranes moist Respiratory: Normal inspiratory effort Cardiovascular: Normal rate GI: nondistended skin: No rash, skin intact Musculoskeletal: Left hip: Exam somewhat limited as patient cannot get up on the table.  Pelvis stable. she is able to stand but unable to bear full weight onto leg.  Tender bruising over the ischial tuberosity.  No erythema.  No muscular  tenderness over gluteal muscles, down IT band, quadriceps. No pain with passive abduction/adduction of leg. No pain with int/ext rotation hip.  Pain with hip flexion.  No pain with hip extension no tenderness at sciatic notch.  Flexion/extension knee WNL. Knee joint NT, stable. Motor strength flexion/ext hip 5/5. Sensation to LT intact. PT 2+   Neurologic: Alert & oriented x 3, no focal neuro deficits Psychiatric: Speech and behavior appropriate   ED Course   Medications - No data to display  Orders Placed This Encounter  Procedures   DG Hip Unilat With Pelvis 2-3 Views Left    Mechanical fall yesterday, bruising over ischial tuberosity, pain with flexion.  History of osteoporosis.  Rule out fracture.    Standing Status:   Standing    Number of Occurrences:   1    Order Specific Question:   Symptom/Reason for Exam    Answer:   Fall [[id number]]    No results found for this or any previous visit (from the past 24 hour(s)). DG Hip Unilat With Pelvis 2-3 Views Left  Result Date: 12/09/2021 CLINICAL DATA:  Fall, hip pain EXAM: DG HIP (WITH OR WITHOUT PELVIS) 2-3V LEFT COMPARISON:  07/13/2018 FINDINGS: Diffuse osseous demineralization. Prior right total hip arthroplasty. Left hip joint intact without evidence of fracture or dislocation. Moderate degenerative changes of the left hip. No appreciable soft tissue abnormality. IMPRESSION: 1. No acute osseous abnormality of the left hip. If high clinical suspicion for fracture remains, a CT or MRI could be considered as subtle nondisplaced fractures can be occult in the setting of bony demineralization. 2. Moderate degenerative changes of the left hip. Electronically Signed   By: Davina Poke D.O.   On: 12/09/2021 12:47    ED Clinical Impression  1. Contusion of left hip, initial encounter   2. Fall, initial encounter      ED Assessment/Plan     Patient did not mention any elbow issues to me, so we focused on the hip.  While she has good  range of motion, will x-ray hip due to history of osteoporosis and bruising.  Calculated creatinine clearance 69 mL/min from labs done in June of this year.   Reviewed imaging independently.  Diffuse osteopenia.  No fracture, dislocation left hip.  Moderate degenerative changes.  Consider CT or MRI if high clinical suspicion for occult fracture.  See radiology report for full details.  Patient has no acute fracture on x-ray.  She does have moderate degenerative changes.  Presentation is consistent with a contusion of the left hip.  However, will have her follow-up with [facility] if she is still having symptoms in a week or if she gets worse for advanced imaging to rule out occult fracture.  200 to 400 mg ibuprofen will combined with 500  mg of Tylenol 3 times a day as needed for pain.  Ice for the first 72 hours, then heat.  ER return precautions given to patient and daughter.  Discussed  imaging, MDM, treatment plan, and plan for follow-up with patient and daughter.  Discussed sn/sx that should prompt return to the ED. they agree with plan.   No orders of the defined types were placed in this encounter.     *This clinic note was created using Dragon dictation software. Therefore, there may be occasional mistakes despite careful proofreading.  ?    Melynda Ripple, MD 12/10/21 [phone]

## 2021-12-09 NOTE — Discharge Instructions (Addendum)
Your x-ray was negative for fracture.  However, you could have a fracture that is not seen on x-ray.  Follow-up with orthopedics or with Dr. Zigmund Daniel, sports medicine, if you are still having hip pain in a week, or if you start to get worse.  You can take 200 to 400 mg of ibuprofen combined with 500 mg of Tylenol 3 times a day.  This is an effective combination for pain.  Ice for the first 72 hours, then heat as needed.

## 2021-12-14 ENCOUNTER — Other Ambulatory Visit: Payer: Self-pay | Admitting: Student

## 2021-12-14 ENCOUNTER — Ambulatory Visit
Admission: RE | Admit: 2021-12-14 | Discharge: 2021-12-14 | Disposition: A | Discharge status: Home / Self Care | Payer: Medicare HMO | Source: Ambulatory Visit | Attending: Student | Admitting: Student

## 2021-12-14 DIAGNOSIS — M25552 Pain in left hip: Secondary | ICD-10-CM

## 2021-12-14 DIAGNOSIS — S32512A Fracture of superior rim of left pubis, initial encounter for closed fracture: Secondary | ICD-10-CM | POA: Diagnosis not present

## 2021-12-14 DIAGNOSIS — S32592A Other specified fracture of left pubis, initial encounter for closed fracture: Secondary | ICD-10-CM | POA: Diagnosis not present

## 2021-12-14 DIAGNOSIS — M1612 Unilateral primary osteoarthritis, left hip: Secondary | ICD-10-CM | POA: Diagnosis not present

## 2021-12-14 DIAGNOSIS — M47816 Spondylosis without myelopathy or radiculopathy, lumbar region: Secondary | ICD-10-CM | POA: Diagnosis not present

## 2021-12-15 ENCOUNTER — Encounter: Payer: Self-pay | Admitting: Emergency Medicine

## 2021-12-15 ENCOUNTER — Observation Stay
Admission: EM | Admit: 2021-12-15 | Discharge: 2021-12-21 | Disposition: A | Discharge status: Skilled Nursing Facility | Payer: Medicare HMO | Attending: Internal Medicine | Admitting: Internal Medicine

## 2021-12-15 ENCOUNTER — Other Ambulatory Visit: Payer: Self-pay

## 2021-12-15 DIAGNOSIS — S32591A Other specified fracture of right pubis, initial encounter for closed fracture: Secondary | ICD-10-CM | POA: Diagnosis not present

## 2021-12-15 DIAGNOSIS — S32512A Fracture of superior rim of left pubis, initial encounter for closed fracture: Secondary | ICD-10-CM | POA: Diagnosis not present

## 2021-12-15 DIAGNOSIS — S32592A Other specified fracture of left pubis, initial encounter for closed fracture: Secondary | ICD-10-CM | POA: Diagnosis not present

## 2021-12-15 DIAGNOSIS — Z96641 Presence of right artificial hip joint: Secondary | ICD-10-CM

## 2021-12-15 DIAGNOSIS — Z9189 Other specified personal risk factors, not elsewhere classified: Secondary | ICD-10-CM

## 2021-12-15 DIAGNOSIS — M1611 Unilateral primary osteoarthritis, right hip: Secondary | ICD-10-CM | POA: Diagnosis present

## 2021-12-15 DIAGNOSIS — S72002A Fracture of unspecified part of neck of left femur, initial encounter for closed fracture: Secondary | ICD-10-CM | POA: Diagnosis not present

## 2021-12-15 DIAGNOSIS — Y9301 Activity, walking, marching and hiking: Secondary | ICD-10-CM | POA: Insufficient documentation

## 2021-12-15 DIAGNOSIS — Z85828 Personal history of other malignant neoplasm of skin: Secondary | ICD-10-CM | POA: Insufficient documentation

## 2021-12-15 DIAGNOSIS — Z85820 Personal history of malignant melanoma of skin: Secondary | ICD-10-CM

## 2021-12-15 DIAGNOSIS — W19XXXA Unspecified fall, initial encounter: Secondary | ICD-10-CM | POA: Diagnosis not present

## 2021-12-15 DIAGNOSIS — R102 Pelvic and perineal pain: Secondary | ICD-10-CM | POA: Diagnosis not present

## 2021-12-15 DIAGNOSIS — M170 Bilateral primary osteoarthritis of knee: Secondary | ICD-10-CM | POA: Diagnosis present

## 2021-12-15 DIAGNOSIS — I10 Essential (primary) hypertension: Secondary | ICD-10-CM | POA: Insufficient documentation

## 2021-12-15 DIAGNOSIS — S32502A Unspecified fracture of left pubis, initial encounter for closed fracture: Secondary | ICD-10-CM | POA: Diagnosis not present

## 2021-12-15 DIAGNOSIS — R9431 Abnormal electrocardiogram [ECG] [EKG]: Secondary | ICD-10-CM | POA: Diagnosis not present

## 2021-12-15 DIAGNOSIS — M25552 Pain in left hip: Secondary | ICD-10-CM | POA: Diagnosis present

## 2021-12-15 DIAGNOSIS — Z79899 Other long term (current) drug therapy: Secondary | ICD-10-CM | POA: Insufficient documentation

## 2021-12-15 LAB — CBC WITH DIFFERENTIAL/PLATELET
Abs Immature Granulocytes: 0.03 10*3/uL (ref 0.00–0.07)
Basophils Absolute: 0.1 10*3/uL (ref 0.0–0.1)
Basophils Relative: 1 %
Eosinophils Absolute: 0.2 10*3/uL (ref 0.0–0.5)
Eosinophils Relative: 2 %
HCT: 40 % (ref 36.0–46.0)
Hemoglobin: 13.1 g/dL (ref 12.0–15.0)
Immature Granulocytes: 0 %
Lymphocytes Relative: 23 %
Lymphs Abs: 1.9 10*3/uL (ref 0.7–4.0)
MCH: 30.1 pg (ref 26.0–34.0)
MCHC: 32.8 g/dL (ref 30.0–36.0)
MCV: 92 fL (ref 80.0–100.0)
Monocytes Absolute: 1.1 10*3/uL — ABNORMAL HIGH (ref 0.1–1.0)
Monocytes Relative: 13 %
Neutro Abs: 5.2 10*3/uL (ref 1.7–7.7)
Neutrophils Relative %: 61 %
Platelets: 253 10*3/uL (ref 150–400)
RBC: 4.35 MIL/uL (ref 3.87–5.11)
RDW: 13.2 % (ref 11.5–15.5)
WBC: 8.4 10*3/uL (ref 4.0–10.5)
nRBC: 0 % (ref 0.0–0.2)

## 2021-12-15 LAB — COMPREHENSIVE METABOLIC PANEL
ALT: 12 U/L (ref 0–44)
AST: 29 U/L (ref 15–41)
Albumin: 3.9 g/dL (ref 3.5–5.0)
Alkaline Phosphatase: 47 U/L (ref 38–126)
Anion gap: 11 (ref 5–15)
BUN: 17 mg/dL (ref 8–23)
CO2: 22 mmol/L (ref 22–32)
Calcium: 9.6 mg/dL (ref 8.9–10.3)
Chloride: 100 mmol/L (ref 98–111)
Creatinine, Ser: 0.61 mg/dL (ref 0.44–1.00)
GFR, Estimated: >60 mL/min (ref >60)
Glucose, Bld: 98 mg/dL (ref 70–99)
Potassium: 5 mmol/L (ref 3.5–5.1)
Sodium: 133 mmol/L — ABNORMAL LOW (ref 135–145)
Total Bilirubin: 0.7 mg/dL (ref 0.3–1.2)
Total Protein: 7.5 g/dL (ref 6.5–8.1)

## 2021-12-15 MED ORDER — HYDRALAZINE HCL 10 MG PO TABS: 10.0000 mg | ORAL_TABLET | Freq: Four times a day (QID) | ORAL | Status: DC | PRN

## 2021-12-15 MED ORDER — HYDRALAZINE HCL 10 MG PO TABS
10.0000 mg | ORAL_TABLET | Freq: Four times a day (QID) | ORAL | Status: AC | PRN
  Administered 2021-12-16: 10 mg via ORAL
  Filled 2021-12-15 (×2): qty 1

## 2021-12-15 MED ORDER — ACETAMINOPHEN 500 MG PO TABS
1000.0000 mg | ORAL_TABLET | Freq: Once | ORAL | Status: AC
  Administered 2021-12-15: 1000 mg via ORAL
  Filled 2021-12-15: qty 2

## 2021-12-15 MED ORDER — ACETAMINOPHEN 650 MG RE SUPP: 650.0000 mg | Freq: Four times a day (QID) | RECTAL | Status: AC | PRN

## 2021-12-15 MED ORDER — DICLOFENAC SODIUM 1 % EX GEL: 2.0000 g | Freq: Three times a day (TID) | CUTANEOUS | Status: AC | PRN

## 2021-12-15 MED ORDER — ACETAMINOPHEN 325 MG PO TABS
650.0000 mg | ORAL_TABLET | Freq: Four times a day (QID) | ORAL | Status: AC | PRN
  Administered 2021-12-18 – 2021-12-20 (×5): 650 mg via ORAL
  Filled 2021-12-15 (×5): qty 2

## 2021-12-15 MED ORDER — MORPHINE SULFATE (PF) 2 MG/ML IV SOLN
1.0000 mg | INTRAVENOUS | Status: DC | PRN
  Administered 2021-12-16 – 2021-12-17 (×4): 1 mg via INTRAVENOUS
  Filled 2021-12-15 (×4): qty 1

## 2021-12-15 MED ORDER — HYDROCODONE-ACETAMINOPHEN 5-325 MG PO TABS
1.0000 | ORAL_TABLET | Freq: Four times a day (QID) | ORAL | Status: AC | PRN
  Administered 2021-12-15 – 2021-12-16 (×3): 1 via ORAL
  Filled 2021-12-15 (×3): qty 1

## 2021-12-15 MED ORDER — SENNOSIDES-DOCUSATE SODIUM 8.6-50 MG PO TABS
1.0000 | ORAL_TABLET | Freq: Every day | ORAL | Status: DC
  Administered 2021-12-15 – 2021-12-20 (×5): 1 via ORAL
  Filled 2021-12-15 (×6): qty 1

## 2021-12-15 MED ORDER — ENOXAPARIN SODIUM 40 MG/0.4ML IJ SOSY
40.0000 mg | PREFILLED_SYRINGE | INTRAMUSCULAR | Status: DC
  Administered 2021-12-15 – 2021-12-20 (×6): 40 mg via SUBCUTANEOUS
  Filled 2021-12-15 (×6): qty 0.4

## 2021-12-15 MED ORDER — HYDROCODONE-ACETAMINOPHEN 5-325 MG PO TABS: 1.0000 | ORAL_TABLET | Freq: Four times a day (QID) | ORAL | Status: DC | PRN

## 2021-12-15 NOTE — ED Provider Notes (Addendum)
[facility] Provider Note    Event Date/Time   First MD Initiated Contact with Patient 12/15/21 1120     (approximate)   History   Hip Pain   HPI  Becky Shea is a 88 y.o. female past medical history of arthritis and hearing loss who presents with left hip pain patient about a week ago patient was outside when she had a mechanical fall onto the left hip.  Did not hit her head.  She was doing okay walking around until Friday when she started having worsening hip pain.  She was seen at [facility] and had an x-ray and then a CT scan that showed inferior and superior pubic rami fractures.  Patient has some chronic weakness on the right side because she never did physical therapy after she had the right hip replaced.  She is now unable to get around because of significant pain.  Denies numbness tingling weakness no bowel bladder incontinence otherwise is well without recent illnesses.  Patient lives alone and is independent     Past Medical History:  Diagnosis Date   Arthritis    GERD (gastroesophageal reflux disease)    rare but no meds   HOH (hard of hearing)    wears bilateral hearing aides    Patient Active Problem List   Diagnosis Date Noted   Rotator cuff tendinitis, right 12/27/2018   History of malignant melanoma of skin 10/22/2018   Status post total hip replacement, right 07/13/2018   Sebaceous cyst 04/09/2018   Lumbar spondylosis 03/17/2018   Primary osteoarthritis of right hip 03/17/2018   Primary osteoarthritis of both knees 12/27/2014     Physical Exam  Triage Vital Signs: ED Triage Vitals  Enc Vitals Group     BP 12/15/21 1047 (!) 159/85     Pulse Rate 12/15/21 1047 73     Resp 12/15/21 1047 20     Temp 12/15/21 1047 98.5 F (36.9 C)     Temp src --      SpO2 12/15/21 1047 98 %     Weight 12/15/21 1026 175 lb (79.4 kg)     Height 12/15/21 1026 '5\' 4"'$  (1.626 m)     Head Circumference --      Peak Flow --      Pain  Score --      Pain Loc --      Pain Edu? --      Excl. in [place]? --     Most recent vital signs: Vitals:   12/15/21 1047  BP: (!) 159/85  Pulse: 73  Resp: 20  Temp: 98.5 F (36.9 C)  SpO2: 98%     General: Awake, no distress.  CV:  Good peripheral perfusion.  Resp:  Normal effort.  Abd:  No distention.  Neuro:             Awake, Alert, Oriented x 3  Other:  Small area of ecchymosis over the left hip Normal strength with plantarflexion and dorsiflexion bilateral lower extremities Range of motion of the hip deferred 2+ DP pulse bilaterally   ED Results / Procedures / Treatments  Labs (all labs ordered are listed, but only abnormal results are displayed) Labs Reviewed  CBC WITH DIFFERENTIAL/PLATELET - Abnormal; Notable for the following components:      Result Value   Monocytes Absolute 1.1 (*)    All other components within normal limits  COMPREHENSIVE METABOLIC PANEL     EKG  RADIOLOGY I reviewed interpreted the CT done outpatient which shows inferior and superior left-sided pubic rami fractures   PROCEDURES:  Critical Care performed: No  .1-3 Lead EKG Interpretation  Performed by: Rada Hay, MD Authorized by: Rada Hay, MD     Interpretation: normal     ECG rate assessment: normal     Rhythm: sinus rhythm     Ectopy: none     Conduction: normal     The patient is on the cardiac monitor to evaluate for evidence of arrhythmia and/or significant heart rate changes.   MEDICATIONS ORDERED IN ED: Medications  acetaminophen (TYLENOL) tablet 1,000 mg (has no administration in time range)     IMPRESSION / MDM / ASSESSMENT AND PLAN / ED COURSE  I reviewed the triage vital signs and the nursing notes.                              Patient's presentation is most consistent with acute presentation with potential threat to life or bodily function.  Differential diagnosis includes, but is not limited to, hip fracture, pelvic fracture    Patient is a 88 year old female with recent fall onto left hip about a week ago seen at [facility] due to progressive left hip pain found to have inferior and superior left-sided pubic rami fractures.  Patient has been having progressive difficulty at home due to decreased ability to ambulate because of pain.  Her daughters tell me that she was told by orthopedics that patient had to be immobile but they are unable to care for her at home because of this.  Given that there are only pubic rami fracture I suspect the patient will be weightbearing as tolerated but this has been quite difficult for her at home and she likely will need admission for pain control.  Plan to discuss with Dr. Sabra Heck with orthopedics.  Discussed with Dr. Sabra Heck with orthopedics who notes that patient can be weightbearing as tolerated.  He will see her tomorrow.     FINAL CLINICAL IMPRESSION(S) / ED DIAGNOSES   Final diagnoses:  Closed fracture of ramus of left pubis, initial encounter ([place])     Rx / DC Orders   ED Discharge Orders     None        Note:  This document was prepared using Dragon voice recognition software and may include unintentional dictation errors.   Rada Hay, MD 12/15/21 1150    Rada Hay, MD 12/15/21 1224

## 2021-12-15 NOTE — Hospital Course (Signed)
[name] is a 88 year old female with history of arthritis, bilateral hearing loss, aortic atherosclerosis, neuropathy, who presents emergency department for chief concerns of left-sided hip pain.  Patient had a fall about 1 week ago and denies head injury.  Patient has been unable to ambulate since then.  CT pelvis without contrast was read as nondisplaced fractures of the left superior and inferior pubic rami.  Osteopenia.  Aortic atherosclerosis.  Initial vitals in the emergency department showed temperature of 98.5, respiration rate of 20, heart rate 73, blood pressure 159/85, SPO2 of 98% on room air.  Serum sodium is 133, potassium 5.0, chloride 100, bicarb 22, BUN of 17, serum creatinine of 0.61, nonfasting blood glucose is 98.  WBC 8.4, hemoglobin 13.1, platelets of 253.  ED treatment: Acetaminophen 1 g p.o.

## 2021-12-15 NOTE — Assessment & Plan Note (Addendum)
-   No prior diagnosis, presumed secondary to pain - Hydralazine 10 mg p.o. every 6 hours as needed for SBP greater than 175, 4 days ordered

## 2021-12-15 NOTE — Assessment & Plan Note (Signed)
-   Senna docusate 1 tablet nightly resumed

## 2021-12-15 NOTE — ED Triage Notes (Addendum)
Pt via EMS from home. Pt had a CT done yesterday after a referral to [facility], pt has a fall a week ago. CT showed pelvis fx and family was told to keep patient immobile. Family states they are unable to have someone there to take care of her due to her immobility. States that this morning she is unable to move. Orthopedics recommended immobility but family unknown how long and states they are unable to be there for her 24/7. Pt has a hx of R sided hip replacement. Pt is A&OX4 and NAD. HOH. Pt denies any pain.

## 2021-12-15 NOTE — Assessment & Plan Note (Signed)
-   As needed diclofenac topical gel, 3 times daily as needed for joint pain, osteoarthritis pain of the joints

## 2021-12-15 NOTE — Assessment & Plan Note (Addendum)
-   Supportive measures - Pain control: Acetaminophen 650 mg p.o./rectal every 6 hours as needed for mild pain, fever, headaches;  5-325 mg p.o. every 6 hours as needed for moderate pain, 3 doses ordered; morphine 1 mg IV every 4 hours as needed for severe pain, 4 doses ordered - Senna docusate 1 tablet nightly ordered - PT, OT, TOC has been consulted

## 2021-12-15 NOTE — Plan of Care (Signed)

## 2021-12-15 NOTE — Evaluation (Signed)
Physical Therapy Evaluation Patient Details Name: Becky Shea MRN: [id number] DOB: 1932/01/05 Today's Date: 12/15/2021  History of Present Illness  Pt is an 88 y/o F admitted on 12/15/21 after presenting with c/o L hip pain that began ~1 week ago following a fall outside. CT scan revealed inferior & superior pubic rami fxs. Ortho was consulted & Dr. Sabra Heck notes pt can be WBAT. PMH: arthritis, hearing loss, aortic atherosclerosis, neuropathy  Clinical Impression  Pt seen for PT evaluation with pt agreeable to tx. Prior to admission pt resided alone in a 1 level home with 2 steps to enter with B rails. Pt was ambulatory with rollator & daughter can assist PRN as she works a full time. On this date, pt is able to complete supine>sit with hospital bed features, STS & stand pivot with min assist. Assisted pt with donning pants as pt with difficulty threading pants on BLE & pulling up over hips. Pt is able to progress to gait into the hallway with RW & CGA<>min assist but presents with vaulting gait 2/2 RLE weakness with pt noting this is chronic. At this time, will recommend STR as pt is unable to safely return home alone, but anticipate if pt progresses quickly she can d/c with HHPT. Will plan to see pt again in the morning.   Recommendations for follow up therapy are one component of a multi-disciplinary discharge planning process, led by the attending physician.  Recommendations may be updated based on patient status, additional functional criteria and insurance authorization.  Follow Up Recommendations Skilled nursing-short term rehab (<3 hours/day) Can patient physically be transported by private vehicle: Yes    Assistance Recommended at Discharge Intermittent Supervision/Assistance  Patient can return home with the following  A little help with walking and/or transfers;Assistance with cooking/housework;A little help with bathing/dressing/bathroom;Assist for transportation;Help with  stairs or ramp for entrance    Equipment Recommendations Rolling walker (2 wheels)  Recommendations for Other Services  OT consult    Functional Status Assessment Patient has had a recent decline in their functional status and demonstrates the ability to make significant improvements in function in a reasonable and predictable amount of time.     Precautions / Restrictions Precautions Precautions: Fall Restrictions Weight Bearing Restrictions: Yes LLE Weight Bearing: Weight bearing as tolerated      Mobility  Bed Mobility Overal bed mobility: Needs Assistance Bed Mobility: Supine to Sit     Supine to sit: Supervision, HOB elevated     General bed mobility comments: Uses bed rails, HOB slightly elevated, uses UE to assist RLE to EOB    Transfers Overall transfer level: Needs assistance   Transfers: Bed to chair/wheelchair/BSC, Sit to/from Stand Sit to Stand: Min assist (educational cuing re: safe hand placement) Stand pivot transfers: Min assist (HHA)              Ambulation/Gait Ambulation/Gait assistance: Min assist, Min guard Gait Distance (Feet): 30 Feet Assistive device: Rolling walker (2 wheels) Gait Pattern/deviations: Decreased step length - left, Decreased step length - right, Decreased dorsiflexion - left, Decreased dorsiflexion - right, Decreased stride length Gait velocity: slightly decreased     General Gait Details: Pt presents with vaulting gait to allow increased ease & ability to progress RLE during swing phase as pt demonstrates decreased hip flexion. Pt reports this is baseline for her since THA ~3 years ago.  Stairs            Wheelchair Mobility    Modified Rankin (Stroke Patients  Only)       Balance Overall balance assessment: Needs assistance Sitting-balance support: Feet supported, Bilateral upper extremity supported Sitting balance-Leahy Scale: Fair Sitting balance - Comments: supervision sitting EOB   Standing balance  support: During functional activity, Bilateral upper extremity supported, Reliant on assistive device for balance Standing balance-Leahy Scale: Fair                               Pertinent Vitals/Pain Pain Assessment Pain Assessment: Faces Faces Pain Scale: Hurts a little bit Pain Location: L pelvis Pain Descriptors / Indicators: Discomfort, Guarding Pain Intervention(s): Monitored during session    Home Living Family/patient expects to be discharged to:: Private residence Living Arrangements: Alone Available Help at Discharge: Family;Available PRN/intermittently Type of Home: House Home Access: Stairs to enter Entrance Stairs-Rails: Right;Left;Can reach both Entrance Stairs-Number of Steps: 2   Home Layout: One level Home Equipment: Rollator (4 wheels)      Prior Function Prior Level of Function : Independent/Modified Independent             Mobility Comments: Pt was independent with rollator prior to admission.       Hand Dominance        Extremity/Trunk Assessment   Upper Extremity Assessment Upper Extremity Assessment: Overall WFL for tasks assessed    Lower Extremity Assessment Lower Extremity Assessment: RLE deficits/detail;LLE deficits/detail RLE Deficits / Details: pt with hx of RLE weakness following hip replacement ~3 years ago (per chart pt did not participate in PT after THA) LLE Deficits / Details: generalized weakness but 3/5 knee extension in sitting       Communication   Communication: HOH (hearing aides)  Cognition Arousal/Alertness: Awake/alert Behavior During Therapy: WFL for tasks assessed/performed Overall Cognitive Status: Within Functional Limits for tasks assessed                                          General Comments      Exercises     Assessment/Plan    PT Assessment Patient needs continued PT services  PT Problem List Decreased strength;Decreased activity tolerance;Pain;Decreased  balance;Decreased mobility;Decreased knowledge of use of DME       PT Treatment Interventions DME instruction;Therapeutic exercise;Balance training;Gait training;Stair training;Functional mobility training;Patient/family education;Neuromuscular re-education;Modalities;Manual techniques    PT Goals (Current goals can be found in the Care Plan section)  Acute Rehab PT Goals Patient Stated Goal: go home, return to PLOF PT Goal Formulation: With patient/family Time For Goal Achievement: 12/29/21 Potential to Achieve Goals: Good    Frequency 7X/week     Co-evaluation               AM-PAC PT "6 Clicks" Mobility  Outcome Measure Help needed turning from your back to your side while in a flat bed without using bedrails?: None Help needed moving from lying on your back to sitting on the side of a flat bed without using bedrails?: A Little Help needed moving to and from a bed to a chair (including a wheelchair)?: A Little Help needed standing up from a chair using your arms (e.g., wheelchair or bedside chair)?: A Little Help needed to walk in hospital room?: A Little Help needed climbing 3-5 steps with a railing? : A Lot 6 Click Score: 18    End of Session   Activity Tolerance: Patient tolerated treatment  well Patient left: in chair;with call bell/phone within reach;with family/visitor present Nurse Communication: Mobility status PT Visit Diagnosis: Difficulty in walking, not elsewhere classified (R26.2);Muscle weakness (generalized) (M62.81);Pain;Other abnormalities of gait and mobility (R26.89) Pain - Right/Left: Left Pain - part of body:  (pelvis)    Time: 1430-1455 PT Time Calculation (min) (ACUTE ONLY): 25 min   Charges:   PT Evaluation $PT Eval Moderate Complexity: Lyon, PT, DPT 12/15/21, 3:14 PM   Waunita Schooner 12/15/2021, 3:11 PM

## 2021-12-15 NOTE — H&P (Signed)
History and Physical   Jayleana Colberg WCB:[id number] DOB: February 21, 1931 DOA: 12/15/2021  PCP: [name], MD  Patient coming from: Home via EMS  I have personally briefly reviewed patient's old medical records in [employer].  Chief Concern: Left hip pain  HPI: Ms. Becky Shea is a 88 year old female with history of arthritis, bilateral hearing loss, aortic atherosclerosis, neuropathy, who presents emergency department for chief concerns of left-sided hip pain.  Patient had a fall about 1 week ago and denies head injury.  Patient has been unable to ambulate since then.  CT pelvis without contrast was read as nondisplaced fractures of the left superior and inferior pubic rami.  Osteopenia.  Aortic atherosclerosis.  Initial vitals in the emergency department showed temperature of 98.5, respiration rate of 20, heart rate 73, blood pressure 159/85, SPO2 of 98% on room air.  Serum sodium is 133, potassium 5.0, chloride 100, bicarb 22, BUN of 17, serum creatinine of 0.61, nonfasting blood glucose is 98.  WBC 8.4, hemoglobin 13.1, platelets of 253.  ED treatment: Acetaminophen 1 g p.o.  At bedside patient was able to tell me her full name, her age, the current calendar year, her current location.  She reports that approximately 1 week ago she was in the garden trimming, and she had a wheelbarrow, and she tripped and fell.  She denies hitting her head or loss of consciousness.  She reports that initially she was still able to ambulate however it gradually got worse and now she is in pain that she cannot ambulate.  When she is laying there and at rest, she does not hurt.  The pain is only made worse when she moves and or attempts to stand up.  She denies chest pain, shortness of breath, fever, cough, dysuria, diarrhea, swelling of her lower extremities.  Social history: She denies tobacco, recreational drug use.  She infrequently drinks wine, last glass was several weeks  ago.  ROS: Constitutional: no weight change, no fever ENT/Mouth: no sore throat, no rhinorrhea Eyes: no eye pain, no vision changes Cardiovascular: no chest pain, no dyspnea,  no edema, no palpitations Respiratory: no cough, no sputum, no wheezing Gastrointestinal: no nausea, no vomiting, no diarrhea, no constipation Genitourinary: no urinary incontinence, no dysuria, no hematuria Musculoskeletal: no arthralgias, + myalgias Skin: no skin lesions, no pruritus, Neuro: + weakness, no loss of consciousness, no syncope Psych: no anxiety, no depression, no decrease appetite Heme/Lymph: no bruising, no bleeding  ED Course: Discussed with emergency medicine provider, patient requiring hospitalization for chief concerns of inadequate pain control at home.  Assessment/Plan  Principal Problem:   Closed fracture of multiple pubic rami, right, initial encounter ([place]) Active Problems:   Primary osteoarthritis of both knees   Primary osteoarthritis of right hip   Status post total hip replacement, right   History of malignant melanoma of skin   Essential hypertension   At risk for constipation   Assessment and Plan:  * Closed fracture of multiple pubic rami, right, initial encounter (HCC) - Supportive measures - Pain control: Acetaminophen 650 mg p.o./rectal every 6 hours as needed for mild pain, fever, headaches;  5-325 mg p.o. every 6 hours as needed for moderate pain, 3 doses ordered; morphine 1 mg IV every 4 hours as needed for severe pain, 4 doses ordered - Senna docusate 1 tablet nightly ordered - PT, OT, TOC has been consulted  At risk for constipation - Senna docusate 1 tablet nightly resumed  Essential hypertension - No  prior diagnosis, presumed secondary to pain - Hydralazine 10 mg p.o. every 6 hours as needed for SBP greater than 175, 4 days ordered  Primary osteoarthritis of both knees - As needed diclofenac topical gel, 3 times daily as needed for joint pain,  osteoarthritis pain of the joints  Chart reviewed.   DVT prophylaxis: Enoxaparin 40 mg subcutaneous every 24 hours Code Status: Full code Diet: Regular diet Family Communication: Updated daughters, [name] and [name] at bedside with patient's permission Disposition Plan: Pending clinical course Consults called: PT, OT, TOC consulted Admission status: MedSurg, inpatient  Past Medical History:  Diagnosis Date   Arthritis    GERD (gastroesophageal reflux disease)    rare but no meds   HOH (hard of hearing)    wears bilateral hearing aides   Past Surgical History:  Procedure Laterality Date   CATARACT EXTRACTION, BILATERAL     NO PAST SURGERIES     TOTAL HIP ARTHROPLASTY Right 07/13/2018   Procedure: TOTAL HIP ARTHROPLASTY;  Surgeon: Corky Mull, MD;  Location: [facility] ORS;  Service: Orthopedics;  Laterality: Right;   Social History:  reports that she has never smoked. She has never used smokeless tobacco. She reports that she does not currently use alcohol. She reports that she does not use drugs.  Allergies  Allergen Reactions   Poison Ivy Extract Rash   Poison Oak Extract Rash   Poison Sumac Extract Rash   History reviewed. No pertinent family history. Family history: Family history reviewed and not pertinent  Prior to Admission medications   Medication Sig Start Date End Date Taking? Authorizing Provider  traMADol (ULTRAM) 50 MG tablet Take 50 mg by mouth every 6 (six) hours as needed. 12/14/21  Yes [provider]  acetaminophen (TYLENOL) 500 MG tablet Take 1,000 mg by mouth every 6 (six) hours as needed for moderate pain.     [provider]  alendronate (FOSAMAX) 70 MG tablet TAKE 1 TABLET BY MOUTH EVERY 7 (SEVEN) DAYS WITH A FULL GLASS OF WATER ON AN EMPTY STOMACH. 07/26/21   [name], MD  AZO-CRANBERRY PO Take 2 tablets by mouth daily before breakfast.    [provider]  diphenhydramine-acetaminophen (TYLENOL PM EXTRA STRENGTH) 25-500  MG TABS tablet Take 1 tablet by mouth at bedtime as needed.    [provider]  gabapentin (NEURONTIN) 100 MG capsule Take 1 capsule (100 mg total) by mouth at bedtime. Take 1 capsule (100 mg total) by mouth nightly- Dr Tellhon/ spine center 05/24/21 08/15/21  [name], MD  KRILL OIL PO Take 350 mg by mouth daily.    [provider]  Magnesium Gluconate 250 MG TABS Take 1 tablet by mouth daily.    [provider]  Melatonin 3 MG TABS Take 1 tablet by mouth at bedtime.    [provider]  Misc Natural Products (OSTEO BI-FLEX ADV DOUBLE ST PO) Take 3 tablets by mouth daily.    [provider]  Multiple Vitamin (MULTI-VITAMIN) tablet Take 1 tablet by mouth daily.    [provider]  Multiple Vitamins-Minerals (PRESERVISION AREDS 2+MULTI VIT PO) Take by mouth.    [provider]  vitamin B-12 (CYANOCOBALAMIN) 1000 MCG tablet Take 1,000 mcg by mouth daily.    [provider]   Physical Exam: Vitals:   12/15/21 1026 12/15/21 1047  BP:  (!) 159/85  Pulse:  73  Resp:  20  Temp:  98.5 F (36.9 C)  SpO2:  98%  Weight: 79.4 kg   Height: '5\' 4"'$  (1.626 m)    Constitutional: appears age-appropriate, frail, NAD, calm, comfortable Eyes: PERRL, lids and conjunctivae normal ENMT: Mucous membranes are moist. Posterior pharynx clear of any exudate or lesions. Age-appropriate dentition.  Bilateral mild to moderate hearing loss Neck: normal, supple, no masses, no thyromegaly Respiratory: clear to auscultation bilaterally, no wheezing, no crackles. Normal respiratory effort. No accessory muscle use.  Cardiovascular: Regular rate and rhythm, no murmurs / rubs / gallops. No extremity edema. 2+ pedal pulses. No carotid bruits.  Abdomen: Obese abdomen, no tenderness, no masses palpated, no hepatosplenomegaly. Bowel sounds positive.  Musculoskeletal: no clubbing / cyanosis. No joint deformity upper and lower extremities. no contractures, no  atrophy. Normal muscle tone.  Decreased range of motion of the left lower extremities Skin: no rashes, lesions, ulcers. No induration Neurologic: Sensation intact. Strength 5/5 in all 4.  Psychiatric: Normal judgment and insight. Alert and oriented x 3. Normal mood.   EKG: independently reviewed, showing sinus rhythm with rate of 78, QTc 413  Chest x-ray on Admission: Not indicated at this time  CT PELVIS WO CONTRAST  Result Date: 12/14/2021 CLINICAL DATA:  Hip pain after fall. EXAM: CT PELVIS WITHOUT CONTRAST TECHNIQUE: Multidetector CT imaging of the pelvis was performed following the standard protocol without intravenous contrast. RADIATION DOSE REDUCTION: This exam was performed according to the departmental dose-optimization program which includes automated exposure control, adjustment of the mA and/or kV according to patient size and/or use of iterative reconstruction technique. COMPARISON:  Left hip x-ray 12/09/2021. FINDINGS: Urinary Tract:  No abnormality visualized. Bowel:  Unremarkable visualized pelvic bowel loops. Vascular/Lymphatic: There are atherosclerotic calcifications of the aorta and iliac arteries. No enlarged lymph nodes are seen. Reproductive:  The uterus and adnexa are within normal limits. Other:  There is no free fluid or focal abdominal wall hernia. Musculoskeletal: The bones are diffusely osteopenic. There is a nondisplaced fracture of the medial left superior pubic ramus extending to the pubic symphysis. There is no pubic symphysis diastasis. There is also nondisplaced fracture of the left inferior pubic ramus. There are moderate degenerative changes of the left hip and severe degenerative changes of the visualized lower lumbar spine. Right hip arthroplasty appears uncomplicated. No focal hematoma. IMPRESSION: 1. Nondisplaced fractures of the left superior and inferior pubic rami. 2. Osteopenia. Aortic Atherosclerosis (ICD10-I70.0). Electronically Signed   By: Ronney Asters  M.D.   On: 12/14/2021 17:09    Labs on Admission: I have personally reviewed following labs  CBC: Recent Labs  Lab 12/15/21 1200  WBC 8.4  NEUTROABS 5.2  HGB 13.1  HCT 40.0  MCV 92.0  PLT 888   Basic Metabolic Panel: Recent Labs  Lab 12/15/21 1200  NA 133*  K 5.0  CL 100  CO2 22  GLUCOSE 98  BUN 17  CREATININE 0.61  CALCIUM 9.6   GFR: Estimated Creatinine Clearance: 49.6 mL/min (by C-G formula based on SCr of 0.61 mg/dL).  Liver Function Tests: Recent Labs  Lab 12/15/21 1200  AST 29  ALT 12  ALKPHOS 47  BILITOT 0.7  PROT 7.5  ALBUMIN 3.9   Urine analysis:    Component Value Date/Time   COLORURINE YELLOW (A) 07/15/2018 0842   APPEARANCEUR CLEAR (A) 07/15/2018 0842   APPEARANCEUR CLEAR 04/12/2013 1627   LABSPEC 1.013 07/15/2018 0842   LABSPEC 1.010 04/12/2013 1627   PHURINE 7.0 07/15/2018 0842   GLUCOSEU NEGATIVE 07/15/2018 0842   GLUCOSEU NEGATIVE 04/12/2013 1627   HGBUR MODERATE (  A) 07/15/2018 0842   BILIRUBINUR negative 08/24/2018 1444   BILIRUBINUR NEGATIVE 04/12/2013 1627   KETONESUR NEGATIVE 07/15/2018 0842   PROTEINUR Negative 08/24/2018 1444   PROTEINUR NEGATIVE 07/15/2018 0842   UROBILINOGEN 0.2 08/24/2018 1444   NITRITE negative 08/24/2018 1444   NITRITE NEGATIVE 07/15/2018 0842   LEUKOCYTESUR Large (3+) (A) 08/24/2018 1444   LEUKOCYTESUR NEGATIVE 07/15/2018 0842   LEUKOCYTESUR 1+ 04/12/2013 1627   Dr. Tobie Poet [facility]  If 7PM-7AM, please contact overnight-coverage provider If 7AM-7PM, please contact day coverage provider [email or web address]  12/15/2021, 1:51 PM

## 2021-12-16 DIAGNOSIS — S32591A Other specified fracture of right pubis, initial encounter for closed fracture: Secondary | ICD-10-CM | POA: Diagnosis not present

## 2021-12-16 DIAGNOSIS — S32512A Fracture of superior rim of left pubis, initial encounter for closed fracture: Secondary | ICD-10-CM | POA: Diagnosis not present

## 2021-12-16 DIAGNOSIS — W19XXXA Unspecified fall, initial encounter: Secondary | ICD-10-CM | POA: Diagnosis not present

## 2021-12-16 DIAGNOSIS — S32592A Other specified fracture of left pubis, initial encounter for closed fracture: Secondary | ICD-10-CM | POA: Diagnosis not present

## 2021-12-16 MED ORDER — MELATONIN 5 MG PO TABS
5.0000 mg | ORAL_TABLET | Freq: Every day | ORAL | Status: DC
  Administered 2021-12-16 – 2021-12-20 (×5): 5 mg via ORAL
  Filled 2021-12-16 (×5): qty 1

## 2021-12-16 MED ORDER — OXYCODONE HCL 5 MG PO TABS: 5.0000 mg | ORAL_TABLET | Freq: Four times a day (QID) | ORAL | 0 refills | Status: DC | PRN

## 2021-12-16 MED ORDER — SENNOSIDES-DOCUSATE SODIUM 8.6-50 MG PO TABS: 1.0000 | ORAL_TABLET | Freq: Every day | ORAL | 0 refills | Status: DC

## 2021-12-16 MED ORDER — ORAL CARE MOUTH RINSE: 15.0000 mL | OROMUCOSAL | Status: DC | PRN

## 2021-12-16 NOTE — TOC Progression Note (Signed)
Transition of Care [facility]) - Progression Note    Patient Details  Name: Becky Shea MRN: [id number] Date of Birth: 1931-02-18  Transition of Care [facility]) CM/SW [place], [place]  Phone Number: 12/16/2021, 5:04 PM  Clinical Narrative:     Per MD patient has changed mind, is now agreeable to SNF.   FL2 done, faxed out referrals pending bed offers at this time.        Expected Discharge Plan and Services           Expected Discharge Date: 12/16/21                                     Social Determinants of Health (SDOH) Interventions Housing Interventions: Intervention Not Indicated  Readmission Risk Interventions     No data to display

## 2021-12-16 NOTE — Discharge Summary (Deleted)
Physician Discharge Summary   Patient: Becky Shea MRN: [id number]  DOB: July 21, 1931   Admit:     Date of Admission: 12/15/2021 Admitted from: home   Discharge: Date of discharge: 12/16/21 Disposition: Home health (patient declined SNF) Condition at discharge: fair  CODE STATUS: FULL CODE     Discharge Physician: Emeterio Reeve, DO [facility]     PCP: [name], MD  Recommendations for Outpatient Follow-up:  Follow up with PCP [name], MD in 2-4 weeks Follow up w/ Dr Sabra Heck w/ [facility] in 1-2 weeks  Please follow up on the following pending results: none PCP AND OTHER OUTPATIENT PROVIDERS: SEE [place] ADDITION TO GENERIC AVS PATIENT INFO    Discharge Instructions     Diet - low sodium heart healthy   Complete by: As directed    Increase activity slowly   Complete by: As directed          Discharge Diagnoses: Principal Problem:   Closed fracture of multiple pubic rami, right, initial encounter ([place]) Active Problems:   Primary osteoarthritis of both knees   Primary osteoarthritis of right hip   Status post total hip replacement, right   History of malignant melanoma of skin   Closed left hip fracture, initial encounter [facility])   Essential hypertension   At risk for constipation       Hospital Course: Becky Shea is a 88 year old female with history of arthritis, bilateral hearing loss, aortic atherosclerosis, neuropathy, who presents emergency department for chief concerns of left-sided hip pain. She reports that approximately 1 week ago 12/08/21 she was in the garden trimming, and she had a wheelbarrow, and she tripped and fell.  She denies hitting her head or loss of consciousness. She was seen at urgent care the following day 11/13 and had an x-ray which showed no hip fracture. FOllowed w/ [facility] had a CT scan 11/18 that showed inferior and  superior pubic rami fractures. She reports that initially she was still able to ambulate however it gradually got worse and now she is in pain that she cannot ambulate. 11/19: VSS, mild hypertensive. Labs no concerns other than mild hyponatremia sodium 133. CT pelvis without contrast 11/18 was read as nondisplaced fractures of the left superior and inferior pubic rami.  Osteopenia.  Aortic atherosclerosis. ED treatment: Acetaminophen 1 g p.o. Admitted to hospitalist service. ED provider, per notes, spoke w/ Dr Sabra Heck of orthopedics, advised pt can WBAT. PT eval recs for SNF rehab. TOC consulted.  11/20: medically stable. Recommended for SNF but patient declines recommendations and would like to go home despite significant safety concerns from medical team.     Consultants:  Orthopedics, Dr. Sabra Heck   Procedures: none      ASSESSMENT & PLAN:   Principal Problem:   Closed fracture of multiple pubic rami, right, initial encounter ([place]) Active Problems:   Primary osteoarthritis of both knees   Primary osteoarthritis of right hip   Status post total hip replacement, right   History of malignant melanoma of skin   Essential hypertension   At risk for constipation   Closed fracture of multiple pubic rami, right, initial encounter ([place]) - Supportive measures - Pain control: Acetaminophen and oxycodone  - Senna docusate 1 tablet nightly ordered -- have recommended SNF but pt declines - TOC will work on getting home health, DME. Patient will need to reach out to support persons for other assistance  if needed.   Essential hypertension - No prior diagnosis, presumed secondary to pain  At risk for constipation - Senna docusate 1 tablet nightly resumed  Primary osteoarthritis of both knees - As needed diclofenac topical gel, 3 times daily as needed for joint pain, osteoarthritis pain of the joints           Discharge Instructions  Allergies as of 12/16/2021       Reactions    Poison Ivy Extract Rash   Poison Oak Extract Rash   Poison Sumac Extract Rash        Medication List     STOP taking these medications    ibuprofen 400 MG tablet Commonly known as: ADVIL   traMADol 50 MG tablet Commonly known as: ULTRAM       TAKE these medications    acetaminophen 500 MG tablet Commonly known as: TYLENOL Take 1,000 mg by mouth every 6 (six) hours as needed for moderate pain.   alendronate 70 MG tablet Commonly known as: FOSAMAX TAKE 1 TABLET BY MOUTH EVERY 7 (SEVEN) DAYS WITH A FULL GLASS OF WATER ON AN EMPTY STOMACH.   AZO-CRANBERRY PO Take 2 tablets by mouth daily before breakfast.   cyanocobalamin 1000 MCG tablet Commonly known as: VITAMIN B12 Take 1,000 mcg by mouth daily.   gabapentin 100 MG capsule Commonly known as: NEURONTIN Take 1 capsule (100 mg total) by mouth at bedtime. Take 1 capsule (100 mg total) by mouth nightly- Dr Tellhon/ spine center   KRILL OIL PO Take 350 mg by mouth daily.   Magnesium Gluconate 250 MG Tabs Take 1 tablet by mouth daily.   melatonin 3 MG Tabs tablet Take 1 tablet by mouth at bedtime.   Multi-Vitamin tablet Take 1 tablet by mouth daily.   OSTEO BI-FLEX ADV DOUBLE ST PO Take 3 tablets by mouth daily.   oxyCODONE 5 MG immediate release tablet Commonly known as: Roxicodone Take 1 tablet (5 mg total) by mouth every 6 (six) hours as needed for severe pain.   PRESERVISION AREDS 2+MULTI VIT PO Take by mouth.   senna-docusate 8.6-50 MG tablet Commonly known as: Senokot-S Take 1 tablet by mouth at bedtime. While on opiate pain medication   Tylenol PM Extra Strength 25-500 MG Tabs tablet Generic drug: diphenhydramine-acetaminophen Take 1 tablet by mouth at bedtime as needed.               Durable Medical Equipment  (From admission, onward)           Start     Ordered   12/16/21 1311  For home use only DME Walker rolling  Once       Question Answer Comment  Walker: With 5 Inch  Wheels   Patient needs a walker to treat with the following condition Pubic ramus fracture (HCC)      12/16/21 1310              Allergies  Allergen Reactions   Poison Ivy Extract Rash   Poison Oak Extract Rash   Poison Sumac Extract Rash     Subjective: pt has no complaints today, states pain is controlled.    Discharge Exam: BP 137/83 (BP Location: Right Arm)   Pulse 81   Temp (!) 97.5 F (36.4 C)   Resp 16   Ht '5\' 4"'$  (1.626 m)   Wt 79.4 kg   SpO2 93%   BMI 30.04 kg/m  General: Pt is alert, awake, not in acute distress Cardiovascular:  RRR, S1/S2 +, no rubs, no gallops Respiratory: CTA bilaterally, no wheezing, no rhonchi Abdominal: Soft, NT, ND, bowel sounds + Extremities: no edema, no cyanosis     The results of significant diagnostics from this hospitalization (including imaging, microbiology, ancillary and laboratory) are listed below for reference.     Microbiology: No results found for this or any previous visit (from the past 240 hour(s)).   Labs: BNP (last 3 results) No results for input(s): "BNP" in the last 8760 hours. Basic Metabolic Panel: Recent Labs  Lab 12/15/21 1200  NA 133*  K 5.0  CL 100  CO2 22  GLUCOSE 98  BUN 17  CREATININE 0.61  CALCIUM 9.6   Liver Function Tests: Recent Labs  Lab 12/15/21 1200  AST 29  ALT 12  ALKPHOS 47  BILITOT 0.7  PROT 7.5  ALBUMIN 3.9   No results for input(s): "LIPASE", "AMYLASE" in the last 168 hours. No results for input(s): "AMMONIA" in the last 168 hours. CBC: Recent Labs  Lab 12/15/21 1200  WBC 8.4  NEUTROABS 5.2  HGB 13.1  HCT 40.0  MCV 92.0  PLT 253   Cardiac Enzymes: No results for input(s): "CKTOTAL", "CKMB", "CKMBINDEX", "TROPONINI" in the last 168 hours. BNP: Invalid input(s): "POCBNP" CBG: No results for input(s): "GLUCAP" in the last 168 hours. D-Dimer No results for input(s): "DDIMER" in the last 72 hours. Hgb A1c No results for input(s): "HGBA1C" in the last  72 hours. Lipid Profile No results for input(s): "CHOL", "HDL", "LDLCALC", "TRIG", "CHOLHDL", "LDLDIRECT" in the last 72 hours. Thyroid function studies No results for input(s): "TSH", "T4TOTAL", "T3FREE", "THYROIDAB" in the last 72 hours.  Invalid input(s): "FREET3" Anemia work up No results for input(s): "VITAMINB12", "FOLATE", "FERRITIN", "TIBC", "IRON", "RETICCTPCT" in the last 72 hours. Urinalysis Sepsis Labs Recent Labs  Lab 12/15/21 1200  WBC 8.4   Microbiology No results found for this or any previous visit (from the past 240 hour(s)). Imaging No results found.    Time coordinating discharge: home 30 minutes  SIGNED:  Emeterio Reeve DO [facility]

## 2021-12-16 NOTE — NC FL2 (Signed)
[place] LEVEL OF CARE SCREENING TOOL     IDENTIFICATION  Patient Name: Becky Shea [facility] Birthdate: [birth year] Sex: female Admission Date (Current Location): 12/15/2021  [facility] and [place] Number:  Engineering geologist and Address:  [facility], [address], [place], [place] [address]      Provider Number: [id number]  Attending Physician Name and Address:  Emeterio Reeve, DO  Relative Name and Phone Number:  [name] (Daughter) [phone]    Current Level of Care: Hospital Recommended Level of Care: [place] Prior Approval Number:    Date Approved/Denied:   PASRR Number: [id number]  Discharge Plan: SNF    Current Diagnoses: Patient Active Problem List   Diagnosis Date Noted   Closed fracture of multiple pubic rami, right, initial encounter ([place]) 12/15/2021   Closed left hip fracture, initial encounter ([place]) 12/15/2021   Essential hypertension 12/15/2021   At risk for constipation 12/15/2021   Rotator cuff tendinitis, right 12/27/2018   History of malignant melanoma of skin 10/22/2018   Status post total hip replacement, right 07/13/2018   Sebaceous cyst 04/09/2018   Lumbar spondylosis 03/17/2018   Primary osteoarthritis of right hip 03/17/2018   Primary osteoarthritis of both knees 12/27/2014    Orientation RESPIRATION BLADDER Height & Weight     Self, Time, Situation, Place  Normal Incontinent, External catheter Weight: 175 lb (79.4 kg) Height:  '5\' 4"'$  (162.6 cm)  BEHAVIORAL SYMPTOMS/MOOD NEUROLOGICAL BOWEL NUTRITION STATUS      Continent Diet (see dc summary)  AMBULATORY STATUS COMMUNICATION OF NEEDS Skin   Limited Assist Verbally Normal                       Personal Care Assistance Level of Assistance  Bathing, Feeding, Dressing, Total care Bathing Assistance: Limited assistance Feeding assistance: Independent Dressing Assistance: Limited assistance Total Care Assistance:  Limited assistance   Functional Limitations Info  Sight, Hearing, Speech Sight Info: Impaired Hearing Info: Impaired Speech Info: Adequate    SPECIAL CARE FACTORS FREQUENCY  PT (By licensed PT), OT (By licensed OT)     PT Frequency: min 4x weekly OT Frequency: min 4x weekly            Contractures Contractures Info: Not present    Additional Factors Info  Code Status, Allergies Code Status Info: full Allergies Info: Poison [employer] Sumac Extract           Current Medications (12/16/2021):  This is the current hospital active medication list Current Facility-Administered Medications  Medication Dose Route Frequency Provider Last Rate Last Admin   acetaminophen (TYLENOL) tablet 650 mg  650 mg Oral Q6H PRN Cox, Amy N, DO       Or   acetaminophen (TYLENOL) suppository 650 mg  650 mg Rectal Q6H PRN Cox, Amy N, DO       diclofenac Sodium (VOLTAREN) 1 % topical gel 2 g  2 g Topical TID PRN Cox, Amy N, DO       enoxaparin (LOVENOX) injection 40 mg  40 mg Subcutaneous Q24H Cox, Amy N, DO   40 mg at 12/15/21 2047   hydrALAZINE (APRESOLINE) tablet 10 mg  10 mg Oral Q6H PRN Cox, Amy N, DO   10 mg at 12/16/21 0905   HYDROcodone-acetaminophen (NORCO/VICODIN) 5-325 MG per tablet 1 tablet  1 tablet Oral Q6H PRN Cox, Amy N, DO   1 tablet at 12/16/21 1043   melatonin  tablet 5 mg  5 mg Oral QHS Emeterio Reeve, DO       morphine (PF) 2 MG/ML injection 1 mg  1 mg Intravenous Q4H PRN Cox, Amy N, DO   1 mg at 12/16/21 0208   Oral care mouth rinse  15 mL Mouth Rinse PRN Cox, Amy N, DO       senna-docusate (Senokot-S) tablet 1 tablet  1 tablet Oral QHS Cox, Amy N, DO   1 tablet at 12/15/21 2046     Discharge Medications: Please see discharge summary for a list of discharge medications.  Relevant Imaging Results:  Relevant Lab Results:   Additional Information SSN: [id number]  Tiburcio Bash, LCSW

## 2021-12-16 NOTE — Progress Notes (Signed)
PROGRESS NOTE    Becky Shea   EYC:[id number] DOB: 1931/02/25  DOA: 12/15/2021 Date of Service: 12/16/21 PCP: Becky Patch, MD     Brief Narrative / Hospital Course:  Ms. Becky Shea is a 88 year old female with history of arthritis, bilateral hearing loss, aortic atherosclerosis, neuropathy, who presents emergency department for chief concerns of left-sided hip pain. She reports that approximately 1 week ago 12/08/21 she was in the garden trimming, and she had a wheelbarrow, and she tripped and fell.  She denies hitting her head or loss of consciousness. She was seen at urgent care the following day 11/13 and had an x-ray which showed no hip fracture. FOllowed w/ [facility] had a CT scan 11/18 that showed inferior and superior pubic rami fractures. She reports that initially she was still able to ambulate however it gradually got worse and now she is in pain that she cannot ambulate. 11/19: VSS, mild hypertensive. Labs no concerns other than mild hyponatremia sodium 133. CT pelvis without contrast 11/18 was read as nondisplaced fractures of the left superior and inferior pubic rami.  Osteopenia.  Aortic atherosclerosis. ED treatment: Acetaminophen 1 g p.o. Admitted to hospitalist service. ED provider, per notes, spoke w/ Dr Becky Shea of orthopedics, advised pt can WBAT. PT eval recs for SNF rehab. TOC consulted.  11/20: Pt initially refusing SNF but later is amenable to this after discussion w/ her daughter. Updated TOC but it's now after hours, will work on dispo in the AM. Discharge discontinued.     Consultants:  ED physician Dr Becky Shea spoke w/ Orthopedics, Dr. Sabra Shea   Procedures: none      ASSESSMENT & PLAN:   Principal Problem:   Closed fracture of multiple pubic rami, right, initial encounter ([place]) Active Problems:   Primary osteoarthritis of both knees   Primary osteoarthritis of right hip   Status post total hip replacement, right   History of malignant  melanoma of skin   Essential hypertension   At risk for constipation   Closed fracture of multiple pubic rami, right, initial encounter ([place]) - Supportive measures - Pain control: Acetaminophen 650 mg p.o./rectal every 6 hours as needed for mild pain, fever, headaches;  5-325 mg p.o. every 6 hours as needed for moderate pain, 3 doses ordered; morphine 1 mg IV every 4 hours as needed for severe pain, 4 doses ordered - Senna docusate 1 tablet nightly ordered - PT, OT, TOC has been consulted  Essential hypertension - No prior diagnosis, presumed secondary to pain - Hydralazine 10 mg p.o. every 6 hours as needed for SBP greater than 175, 4 days ordered  At risk for constipation - Senna docusate 1 tablet nightly resumed  Primary osteoarthritis of both knees - As needed diclofenac topical gel, 3 times daily as needed for joint pain, osteoarthritis pain of the joints    DVT prophylaxis: lovenox Pertinent IV fluids/nutrition: no continuous fluids  Central lines / invasive devices: none  Code Status: FULL CODE  Family Communication: spoke on phone w/ daughter Becky Shea   Disposition: obs TOC needs: SNF rehab Barriers to discharge / significant pending items: SNF arrangement              Subjective:  Patient reports pain is controlled Denies CP/SOB.  Pain controlled.  Denies new weakness.  Tolerating diet.  Reports no concerns w/ urination/defecation.     Objective Findings:  Vitals:   12/15/21 1921 12/15/21 2359 12/16/21 0826 12/16/21 1224  BP: (!) 172/81 (!) 168/97 (!) 173/92  137/83  Pulse: 73 81 90 81  Resp: '17 18  16  '$ Temp: 98.1 F (36.7 C) 98.1 F (36.7 C) (!) 97.5 F (36.4 C)   TempSrc:  Oral    SpO2:  96% 96% 93%  Weight: 79.4 kg     Height: '5\' 4"'$  (1.626 m)       Intake/Output Summary (Last 24 hours) at 12/16/2021 1642 Last data filed at 12/16/2021 0032 Gross per 24 hour  Intake --  Output 1700 ml  Net -1700 ml   Filed Weights   12/15/21 1026  12/15/21 1624 12/15/21 1921  Weight: 79.4 kg 79.4 kg 79.4 kg    Examination:  Constitutional:  VS as above General Appearance: alert, well-developed, well-nourished, NAD Respiratory: Normal respiratory effort No wheeze No rhonchi No rales Cardiovascular: S1/S2 normal No murmur No rub/gallop auscultated No lower extremity edema Gastrointestinal: No tenderness No masses No hernia appreciated Musculoskeletal:  No clubbing/cyanosis of digits Symmetrical movement in all extremities Neurological: No cranial nerve deficit on limited exam Alert Psychiatric: Normal judgment/insight Normal mood and affect       Scheduled Medications:   enoxaparin (LOVENOX) injection  40 mg Subcutaneous Q24H   senna-docusate  1 tablet Oral QHS    Continuous Infusions:   PRN Medications:  acetaminophen **OR** acetaminophen, diclofenac Sodium, hydrALAZINE, HYDROcodone-acetaminophen, morphine injection, mouth rinse  Antimicrobials:  Anti-infectives (From admission, onward)    None           Data Reviewed: I have personally reviewed following labs and imaging studies  CBC: Recent Labs  Lab 12/15/21 1200  WBC 8.4  NEUTROABS 5.2  HGB 13.1  HCT 40.0  MCV 92.0  PLT 263   Basic Metabolic Panel: Recent Labs  Lab 12/15/21 1200  NA 133*  K 5.0  CL 100  CO2 22  GLUCOSE 98  BUN 17  CREATININE 0.61  CALCIUM 9.6   GFR: Estimated Creatinine Clearance: 49.6 mL/min (by C-G formula based on SCr of 0.61 mg/dL). Liver Function Tests: Recent Labs  Lab 12/15/21 1200  AST 29  ALT 12  ALKPHOS 47  BILITOT 0.7  PROT 7.5  ALBUMIN 3.9   No results for input(s): "LIPASE", "AMYLASE" in the last 168 hours. No results for input(s): "AMMONIA" in the last 168 hours. Coagulation Profile: No results for input(s): "INR", "PROTIME" in the last 168 hours. Cardiac Enzymes: No results for input(s): "CKTOTAL", "CKMB", "CKMBINDEX", "TROPONINI" in the last 168 hours. BNP (last 3  results) No results for input(s): "PROBNP" in the last 8760 hours. HbA1C: No results for input(s): "HGBA1C" in the last 72 hours. CBG: No results for input(s): "GLUCAP" in the last 168 hours. Lipid Profile: No results for input(s): "CHOL", "HDL", "LDLCALC", "TRIG", "CHOLHDL", "LDLDIRECT" in the last 72 hours. Thyroid Function Tests: No results for input(s): "TSH", "T4TOTAL", "FREET4", "T3FREE", "THYROIDAB" in the last 72 hours. Anemia Panel: No results for input(s): "VITAMINB12", "FOLATE", "FERRITIN", "TIBC", "IRON", "RETICCTPCT" in the last 72 hours. Most Recent Urinalysis On File:     Component Value Date/Time   COLORURINE YELLOW (A) 07/15/2018 0842   APPEARANCEUR CLEAR (A) 07/15/2018 0842   APPEARANCEUR CLEAR 04/12/2013 1627   LABSPEC 1.013 07/15/2018 0842   LABSPEC 1.010 04/12/2013 1627   PHURINE 7.0 07/15/2018 0842   GLUCOSEU NEGATIVE 07/15/2018 0842   GLUCOSEU NEGATIVE 04/12/2013 1627   HGBUR MODERATE (A) 07/15/2018 0842   BILIRUBINUR negative 08/24/2018 1444   BILIRUBINUR NEGATIVE 04/12/2013 1627   KETONESUR NEGATIVE 07/15/2018 0842   PROTEINUR Negative 08/24/2018 1444  PROTEINUR NEGATIVE 07/15/2018 0842   UROBILINOGEN 0.2 08/24/2018 1444   NITRITE negative 08/24/2018 1444   NITRITE NEGATIVE 07/15/2018 0842   LEUKOCYTESUR Large (3+) (A) 08/24/2018 1444   LEUKOCYTESUR NEGATIVE 07/15/2018 0842   LEUKOCYTESUR 1+ 04/12/2013 1627   Sepsis Labs: '@LABRCNTIP'$ (procalcitonin:4,lacticidven:4)  No results found for this or any previous visit (from the past 240 hour(s)).       Radiology Studies: No results found.          LOS: 1 day     Emeterio Reeve, DO [facility] 12/16/2021, 4:42 PM    Dictation software may have been used to generate the above note. Typos may occur and escape review in typed/dictated notes. Please contact Dr Sheppard Coil directly for clarity if needed.  Staff may message me via secure chat in [place]  but this may not receive an  immediate response,  please page me for urgent matters!  If 7PM-7AM, please contact night coverage [email or web address]

## 2021-12-16 NOTE — Care Management CC44 (Signed)
Condition Code 44 Documentation Completed  Patient Details  Name: Becky Shea MRN: [id number] Date of Birth: 03-08-1931   Condition Code 44 given:  Yes Patient signature on Condition Code 44 notice:  Yes Documentation of 2 MD's agreement:  Yes Code 44 added to claim:  Yes    Judeen Hammans Becky Hennick, LCSW 12/16/2021, 2:03 PM

## 2021-12-16 NOTE — Progress Notes (Signed)
Physical Therapy Treatment Patient Details Name: Becky Shea MRN: [id number] DOB: 08-27-1931 Today's Date: 12/16/2021   History of Present Illness Pt is an 88 y/o F admitted on 12/15/21 after presenting with c/o L hip pain that began ~1 week ago following a fall outside. CT scan revealed inferior & superior pubic rami fxs. Ortho was consulted & Dr. Sabra Heck notesLLE to be PWB. PMH: arthritis, hearing loss, aortic atherosclerosis, neuropathy    PT Comments    Pt agreeable to session, limited by pain. Pt able to ambulate short distance in room, however fatigues and transported via recliner to stairs for stair training. Pt requires +2 for safe performance and use of B railing. Due to urinary urge, needs mesh underwear and pad for mobility attempts. Seated there-ex performed. Answered questions about support at home. Pt willing to hire caregivers for daytime hours and reports daughter is able to assist when able. Pt is very hopeful for dc home if caregivers can be established. Pt would not be safe to dc home alone and will continue to rec SNF until safe plan established. Will continue to progress.   Recommendations for follow up therapy are one component of a multi-disciplinary discharge planning process, led by the attending physician.  Recommendations may be updated based on patient status, additional functional criteria and insurance authorization.  Follow Up Recommendations  Skilled nursing-short term rehab (<3 hours/day) (however pt very motivated to dc home) Can patient physically be transported by private vehicle: Yes   Assistance Recommended at Discharge Intermittent Supervision/Assistance  Patient can return home with the following A little help with walking and/or transfers;A little help with bathing/dressing/bathroom;Help with stairs or ramp for entrance   Equipment Recommendations  Rolling walker (2 wheels);BSC/3in1    Recommendations for Other Services       Precautions /  Restrictions Precautions Precautions: Fall Restrictions Weight Bearing Restrictions: Yes LLE Weight Bearing: Partial weight bearing LLE Partial Weight Bearing Percentage or Pounds: 50     Mobility  Bed Mobility               General bed mobility comments: not tested    Transfers Overall transfer level: Needs assistance Equipment used: Rolling walker (2 wheels) Transfers: Sit to/from Stand Sit to Stand: Min guard           General transfer comment: able to push from seated position with upright posture. RW used. Once standing, cues for WBing    Ambulation/Gait Ambulation/Gait assistance: Min assist, Min guard Gait Distance (Feet): 20 Feet Assistive device: Rolling walker (2 wheels) Gait Pattern/deviations: Decreased step length - left, Decreased step length - right, Decreased dorsiflexion - left, Decreased dorsiflexion - right, Decreased stride length       General Gait Details: ambulated small distance in room due to discomfort. Chronic vaulting noted and good adherence to [facility] restrictions.   Stairs Stairs: Yes Stairs assistance: Mod assist, +2 physical assistance Stair Management: Two rails, Step to pattern Number of Stairs: 2 General stair comments: up/down with decreased R hip flexion, therefore stepping up with L LE needing mod assist +2 for safety. Pt fearful of stairs and needs cues for sequencing   Wheelchair Mobility    Modified Rankin (Stroke Patients Only)       Balance Overall balance assessment: Needs assistance Sitting-balance support: No upper extremity supported, Feet supported Sitting balance-Leahy Scale: Good     Standing balance support: No upper extremity supported, During functional activity Standing balance-Leahy Scale: Fair  Cognition Arousal/Alertness: Awake/alert Behavior During Therapy: WFL for tasks assessed/performed Overall Cognitive Status: Within Functional Limits for tasks  assessed                                          Exercises Other Exercises Other Exercises: seated ther-ex performed on B LE including QS, hip abd/add, and LAQ. 10 reps with supervision    General Comments        Pertinent Vitals/Pain Pain Assessment Pain Assessment: 0-10 Pain Score: 5  Pain Location: L pelvis Pain Descriptors / Indicators: Discomfort, Guarding Pain Intervention(s): Limited activity within patient's tolerance, Repositioned    Home Living Family/patient expects to be discharged to:: Private residence Living Arrangements: Alone Available Help at Discharge: Family;Available PRN/intermittently Type of Home: House Home Access: Stairs to enter Entrance Stairs-Rails: Right;Left;Can reach both Entrance Stairs-Number of Steps: 2   Home Layout: One level Home Equipment: Rollator (4 wheels)      Prior Function            PT Goals (current goals can now be found in the care plan section) Acute Rehab PT Goals Patient Stated Goal: go home, return to PLOF PT Goal Formulation: With patient Time For Goal Achievement: 12/29/21 Potential to Achieve Goals: Good Progress towards PT goals: Progressing toward goals    Frequency    7X/week      PT Plan Current plan remains appropriate    Co-evaluation              AM-PAC PT "6 Clicks" Mobility   Outcome Measure  Help needed turning from your back to your side while in a flat bed without using bedrails?: None Help needed moving from lying on your back to sitting on the side of a flat bed without using bedrails?: A Little Help needed moving to and from a bed to a chair (including a wheelchair)?: A Little Help needed standing up from a chair using your arms (e.g., wheelchair or bedside chair)?: A Little Help needed to walk in hospital room?: A Little Help needed climbing 3-5 steps with a railing? : A Lot 6 Click Score: 18    End of Session Equipment Utilized During Treatment: Gait  belt Activity Tolerance: Patient tolerated treatment well Patient left: in chair;with chair alarm set Nurse Communication: Mobility status PT Visit Diagnosis: Difficulty in walking, not elsewhere classified (R26.2);Muscle weakness (generalized) (M62.81);Pain;Other abnormalities of gait and mobility (R26.89) Pain - Right/Left: Left Pain - part of body:  (pelvis)     Time: 2637-8588 PT Time Calculation (min) (ACUTE ONLY): 32 min  Charges:  $Gait Training: 8-22 mins $Therapeutic Exercise: 8-22 mins                     Greggory Stallion, PT, DPT, GCS [phone]    Becky Shea 12/16/2021, 12:00 PM

## 2021-12-16 NOTE — Evaluation (Signed)
Occupational Therapy Evaluation Patient Details Name: Becky Shea MRN: [id number] DOB: May 26, 1931 Today's Date: 12/16/2021   History of Present Illness Pt is an 88 y/o F admitted on 12/15/21 after presenting with c/o L hip pain that began ~1 week ago following a fall outside. CT scan revealed inferior & superior pubic rami fxs. Ortho was consulted & Dr. Sabra Heck notesLLE to be PWB. PMH: arthritis, hearing loss, aortic atherosclerosis, neuropathy   Clinical Impression   [name] was seen for OT evaluation this date. Prior to hospital admission, pt was MOD I for mobility using 4WW. Pt lives alone. Pt presents to acute OT demonstrating impaired ADL performance and functional mobility 2/2 decreased activity tolerance and functional strength/ROM deficits. Pt currently requires CGA + RW for toilet t/f and doffing jacket in standing. MOD A for LB access seated in chair. Pt would benefit from skilled OT to address noted impairments and functional limitations (see below for any additional details). Upon hospital discharge, recommend STR, may return home with [place] if family able to provide assistance for ADLs.    Recommendations for follow up therapy are one component of a multi-disciplinary discharge planning process, led by the attending physician.  Recommendations may be updated based on patient status, additional functional criteria and insurance authorization.   Follow Up Recommendations  Skilled nursing-short term rehab (<3 hours/day) (may progress)     Assistance Recommended at Discharge Intermittent Supervision/Assistance  Patient can return home with the following A little help with walking and/or transfers;A lot of help with bathing/dressing/bathroom;Help with stairs or ramp for entrance    Functional Status Assessment  Patient has had a recent decline in their functional status and demonstrates the ability to make significant improvements in function in a reasonable and predictable  amount of time.  Equipment Recommendations  BSC/3in1    Recommendations for Other Services       Precautions / Restrictions Precautions Precautions: Fall Restrictions Weight Bearing Restrictions: Yes LLE Weight Bearing: Partial weight bearing LLE Partial Weight Bearing Percentage or Pounds: 50      Mobility Bed Mobility               General bed mobility comments: not tested    Transfers Overall transfer level: Needs assistance Equipment used: Rolling walker (2 wheels) Transfers: Sit to/from Stand Sit to Stand: Min guard                  Balance Overall balance assessment: Needs assistance Sitting-balance support: No upper extremity supported, Feet supported Sitting balance-Leahy Scale: Good     Standing balance support: No upper extremity supported, During functional activity Standing balance-Leahy Scale: Fair                             ADL either performed or assessed with clinical judgement   ADL Overall ADL's : Needs assistance/impaired                                       General ADL Comments: CGA + RW for toilet t/f and doffing jacket in standing. MOD A for LB access seated in chair.      Pertinent Vitals/Pain Pain Assessment Pain Assessment: 0-10 Pain Score: 3  Pain Location: L pelvis Pain Descriptors / Indicators: Discomfort, Guarding Pain Intervention(s): Limited activity within patient's tolerance, Repositioned     Hand Dominance  Extremity/Trunk Assessment Upper Extremity Assessment Upper Extremity Assessment: Overall WFL for tasks assessed   Lower Extremity Assessment Lower Extremity Assessment: Generalized weakness       Communication Communication Communication: HOH   Cognition Arousal/Alertness: Awake/alert Behavior During Therapy: WFL for tasks assessed/performed Overall Cognitive Status: Within Functional Limits for tasks assessed                                                   Home Living Family/patient expects to be discharged to:: Private residence Living Arrangements: Alone Available Help at Discharge: Family;Available PRN/intermittently Type of Home: House Home Access: Stairs to enter [employer] of Steps: 2 Entrance Stairs-Rails: Right;Left;Can reach both Home Layout: One level               Home Equipment: Rollator (4 wheels)          Prior Functioning/Environment Prior Level of Function : Independent/Modified Independent             Mobility Comments: Pt was independent with rollator prior to admission. ADLs Comments: yard work        OT Problem List: Decreased strength;Decreased range of motion;Decreased activity tolerance;Impaired balance (sitting and/or standing);Decreased safety awareness      OT Treatment/Interventions: Self-care/ADL training;Therapeutic exercise;Energy conservation;DME and/or AE instruction;Therapeutic activities;Patient/family education;Balance training    OT Goals(Current goals can be found in the care plan section) Acute Rehab OT Goals Patient Stated Goal: to go home OT Goal Formulation: With patient Time For Goal Achievement: 12/30/21 Potential to Achieve Goals: Good ADL Goals Pt Will Perform Grooming: with modified independence;standing Pt Will Perform Lower Body Dressing: with set-up;with supervision;sit to/from stand Pt Will Transfer to Toilet: with modified independence;ambulating;regular height toilet  OT Frequency: Min 2X/week    Co-evaluation              AM-PAC OT "6 Clicks" Daily Activity     Outcome Measure Help from another person eating meals?: None Help from another person taking care of personal grooming?: A Little Help from another person toileting, which includes using toliet, bedpan, or urinal?: A Little Help from another person bathing (including washing, rinsing, drying)?: A Little Help from another person to put on and taking off regular  upper body clothing?: None Help from another person to put on and taking off regular lower body clothing?: A Lot 6 Click Score: 19   End of Session Equipment Utilized During Treatment: Rolling walker (2 wheels)  Activity Tolerance: Patient tolerated treatment well Patient left: in chair;with call bell/phone within reach;with chair alarm set  OT Visit Diagnosis: Other abnormalities of gait and mobility (R26.89);Muscle weakness (generalized) (M62.81)                Time: 6767-2094 OT Time Calculation (min): 16 min Charges:  OT General Charges $OT Visit: 1 Visit OT Evaluation $OT Eval Moderate Complexity: 1 Mod  Dessie Coma, M.S. OTR/L  12/16/21, 11:14 AM  ascom [phone]

## 2021-12-16 NOTE — Care Management Obs Status (Signed)
[place] NOTIFICATION   Patient Details  Name: Becky Shea MRN: [id number] Date of Birth: Jul 02, 1931   Medicare Observation Status Notification Given:  Yes    Aniko Finnigan D Gracee Ratterree, LCSW 12/16/2021, 2:03 PM

## 2021-12-16 NOTE — Consult Note (Signed)
ORTHOPAEDIC CONSULTATION  REQUESTING PHYSICIAN: Emeterio Reeve, DO  Chief Complaint: Left hip pain  HPI: Becky Shea is a 88 y.o. female who complains of left hip pain after a fall about a week ago.  She was gardening.  She was seen in urgent care over the weekend and x-ray and a CT at the hospital showed nondisplaced superior and inferior pubic rami fractures.  Family took her home but was and unable to manage her so brought her back to [facility] for admission last night.  She will need placement.  Past Medical History:  Diagnosis Date   Arthritis    GERD (gastroesophageal reflux disease)    rare but no meds   HOH (hard of hearing)    wears bilateral hearing aides   Past Surgical History:  Procedure Laterality Date   CATARACT EXTRACTION, BILATERAL     NO PAST SURGERIES     TOTAL HIP ARTHROPLASTY Right 07/13/2018   Procedure: TOTAL HIP ARTHROPLASTY;  Surgeon: Corky Mull, MD;  Location: [facility] ORS;  Service: Orthopedics;  Laterality: Right;   Social History   Socioeconomic History   Marital status: Widowed    Spouse name: Not on file   Number of children: Not on file   Years of education: Not on file   Highest education level: Not on file  Occupational History   Occupation: Librarian, 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  Tobacco Use   Smoking status: Never   Smokeless tobacco: Never  Vaping Use   Vaping Use: Never used  Substance and Sexual Activity   Alcohol use: Not Currently    Comment: occasional   Drug use: No   Sexual activity: Never  Other Topics Concern   Not on file  Social History Narrative   Pt lives alone but daughter [name] comes by daily   Social Determinants of Health   Financial Resource Strain: Low Risk  (05/20/2021)   Overall Financial Resource Strain (CARDIA)    Difficulty of Paying Living Expenses: Not hard at all  Food Insecurity: No Food Insecurity (12/15/2021)   Hunger Vital Sign    Worried About Running Out of Food in the Last Year:  Never true    [place] in the Last Year: Never true  Transportation Needs: No Transportation Needs (12/15/2021)   PRAPARE - Hydrologist (Medical): No    Lack of Transportation (Non-Medical): No  Physical Activity: Insufficiently Active (05/20/2021)   Exercise Vital Sign    Days of Exercise per Week: 2 days    Minutes of Exercise per Session: 60 min  Stress: No Stress Concern Present (05/20/2021)   [place]    Feeling of Stress : Not at all  Social Connections: Socially Isolated (05/20/2021)   Social Connection and Isolation Panel [NHANES]    Frequency of Communication with Friends and Family: More than three times a week    Frequency of Social Gatherings with Friends and Family: More than three times a week    Attends Religious Services: Never    Marine scientist or Organizations: No    Attends Archivist Meetings: Never    Marital Status: Widowed   History reviewed. No pertinent family history. Allergies  Allergen Reactions   Poison Ivy Extract Rash   Poison Oak Extract Rash   Poison Sumac Extract Rash   Prior to Admission medications   Medication Sig Start Date End Date Taking?  Authorizing Provider  acetaminophen (TYLENOL) 500 MG tablet Take 1,000 mg by mouth every 6 (six) hours as needed for moderate pain.    Yes [provider]  AZO-CRANBERRY PO Take 2 tablets by mouth daily before breakfast.   Yes [provider]  diphenhydramine-acetaminophen (TYLENOL PM EXTRA STRENGTH) 25-500 MG TABS tablet Take 1 tablet by mouth at bedtime as needed.   Yes [provider]  gabapentin (NEURONTIN) 100 MG capsule Take 1 capsule (100 mg total) by mouth at bedtime. Take 1 capsule (100 mg total) by mouth nightly- Dr Tellhon/ [facility] center 05/24/21 12/15/21 Yes [name], MD  ibuprofen (ADVIL) 400 MG tablet Take 400 mg by mouth every 4 (four) hours as  needed.   Yes [provider]  KRILL OIL PO Take 350 mg by mouth daily.   Yes [provider]  Magnesium Gluconate 250 MG TABS Take 1 tablet by mouth daily.   Yes [provider]  Melatonin 3 MG TABS Take 1 tablet by mouth at bedtime.   Yes [provider]  Misc Natural Products (OSTEO BI-FLEX ADV DOUBLE ST PO) Take 3 tablets by mouth daily.   Yes [provider]  Multiple Vitamin (MULTI-VITAMIN) tablet Take 1 tablet by mouth daily.   Yes [provider]  Multiple Vitamins-Minerals (PRESERVISION AREDS 2+MULTI VIT PO) Take by mouth.   Yes [provider]  traMADol (ULTRAM) 50 MG tablet Take 50 mg by mouth every 6 (six) hours as needed. 12/14/21  Yes [provider]  vitamin B-12 (CYANOCOBALAMIN) 1000 MCG tablet Take 1,000 mcg by mouth daily.   Yes [provider]  alendronate (FOSAMAX) 70 MG tablet TAKE 1 TABLET BY MOUTH EVERY 7 (SEVEN) DAYS WITH A FULL GLASS OF WATER ON AN EMPTY STOMACH. 07/26/21   [name], MD   CT PELVIS WO CONTRAST  Result Date: 12/14/2021 CLINICAL DATA:  Hip pain after fall. EXAM: CT PELVIS WITHOUT CONTRAST TECHNIQUE: Multidetector CT imaging of the pelvis was performed following the standard protocol without intravenous contrast. RADIATION DOSE REDUCTION: This exam was performed according to the departmental dose-optimization program which includes automated exposure control, adjustment of the mA and/or kV according to patient size and/or use of iterative reconstruction technique. COMPARISON:  Left hip x-ray 12/09/2021. FINDINGS: Urinary Tract:  No abnormality visualized. Bowel:  Unremarkable visualized pelvic bowel loops. Vascular/Lymphatic: There are atherosclerotic calcifications of the aorta and iliac arteries. No enlarged lymph nodes are seen. Reproductive:  The uterus and adnexa are within normal limits. Other:  There is no free fluid or focal abdominal wall hernia. Musculoskeletal: The  bones are diffusely osteopenic. There is a nondisplaced fracture of the medial left superior pubic ramus extending to the pubic symphysis. There is no pubic symphysis diastasis. There is also nondisplaced fracture of the left inferior pubic ramus. There are moderate degenerative changes of the left hip and severe degenerative changes of the visualized lower lumbar spine. Right hip arthroplasty appears uncomplicated. No focal hematoma. IMPRESSION: 1. Nondisplaced fractures of the left superior and inferior pubic rami. 2. Osteopenia. Aortic Atherosclerosis (ICD10-I70.0). Electronically Signed   By: Ronney Asters M.D.   On: 12/14/2021 17:09    Positive ROS: All other systems have been reviewed and were otherwise negative with the exception of those mentioned in the HPI and as above.  Physical Exam: General: Alert, no acute distress Cardiovascular: No pedal edema Respiratory: No cyanosis, no use of accessory musculature GI: No organomegaly, abdomen is soft and non-tender Skin: No  lesions in the area of chief complaint Neurologic: Sensation intact distally Psychiatric: Patient is competent for consent with normal mood and affect Lymphatic: No axillary or cervical lymphadenopathy  MUSCULOSKELETAL: The patient has good motion of the left hip itself.  There is tenderness over the pelvis.  There is no shortening.  Neurovascular status is good distally.  No other orthopedic injuries are noted.  Assessment: Nondisplaced left pubic rami fractures  Plan: PT with partial weightbearing on the left. Skilled nursing placement for further rehab and PT.    Park Breed, MD [phone]   12/16/2021 12:00 PM

## 2021-12-17 DIAGNOSIS — S32591A Other specified fracture of right pubis, initial encounter for closed fracture: Secondary | ICD-10-CM | POA: Diagnosis not present

## 2021-12-17 NOTE — Progress Notes (Signed)
PROGRESS NOTE    Becky Shea   WNI:[id number] DOB: [birth year]  DOA: 12/15/2021 Date of Service: 12/17/21 PCP: [name], MD     Brief Narrative / Hospital Course:  Ms. Becky Shea is a 89 year old female with history of arthritis, bilateral hearing loss, aortic atherosclerosis, neuropathy, who presents emergency department for chief concerns of left-sided hip pain. She reports that approximately 1 week ago 12/08/21 she was in the garden trimming, and she had a wheelbarrow, and she tripped and fell.  She denies hitting her head or loss of consciousness. She was seen at urgent care the following day 11/13 and had an x-ray which showed no hip fracture. FOllowed w/ [facility] had a CT scan 11/18 that showed inferior and superior pubic rami fractures. She reports that initially she was still able to ambulate however it gradually got worse and now she is in pain that she cannot ambulate. 11/19: VSS, mild hypertensive. Labs no concerns other than mild hyponatremia sodium 133. CT pelvis without contrast 11/18 was read as nondisplaced fractures of the left superior and inferior pubic rami.  Osteopenia.  Aortic atherosclerosis. ED treatment: Acetaminophen 1 g p.o. Admitted to hospitalist service. ED provider, per notes, spoke w/ Dr Sabra Heck of orthopedics, advised pt can WBAT. PT eval recs for SNF rehab. TOC consulted.  11/20: Pt initially refusing SNF but later is amenable to this after discussion w/ her daughter. Updated TOC but it's now after hours, will work on dispo in the AM. Discharge discontinued.  11/21: awaiting SNF placement     Consultants:  Orthopedics, Dr. Sabra Heck   Procedures: none      ASSESSMENT & PLAN:   Principal Problem:   Closed fracture of multiple pubic rami, right, initial encounter ([place]) Active Problems:   Primary osteoarthritis of both knees   Primary osteoarthritis of right hip   Status post total hip replacement, right   History of malignant  melanoma of skin   Essential hypertension   At risk for constipation   Closed fracture of multiple pubic rami, right, initial encounter ([facility]) Supportive measures Pain control: Acetaminophen 650 mg p.o./rectal every 6 hours as needed for mild pain, fever, headaches;  5-325 mg p.o. every 6 hours as needed for moderate pain, 3 doses ordered; morphine 1 mg IV every 4 hours as needed for severe pain, 4 doses ordered Senna docusate 1 tablet nightly ordered PT, OT, TOC has been consulted  Essential hypertension No prior diagnosis, presumed secondary to pain Hydralazine 10 mg p.o. every 6 hours as needed for SBP greater than 175, 4 days ordered  At risk for constipation Senna docusate 1 tablet nightly resumed  Primary osteoarthritis of both knees As needed diclofenac topical gel, 3 times daily as needed for joint pain, osteoarthritis pain of the joints    DVT prophylaxis: lovenox Pertinent IV fluids/nutrition: no continuous fluids  Central lines / invasive devices: none  Code Status: FULL CODE  Family Communication: spoke on phone w/ daughter [name]   Disposition: obs TOC needs: SNF rehab Barriers to discharge / significant pending items: SNF arrangement              Subjective:  Patient reports pain is controlled She is very hard of hearing  Denies CP/SOB.  Denies new weakness.  Tolerating diet.  Reports no concerns w/ urination/defecation.     Objective Findings:  Vitals:   12/16/21 1706 12/16/21 2352 12/17/21 0832 12/17/21 1545  BP: 137/85 (!) 122/90 (!) 171/89 (!) 151/84  Pulse: 87 80  91 83  Resp:  '17 17 17  '$ Temp: 98.5 F (36.9 C) 97.9 F (36.6 C) 98.2 F (36.8 C) 98.3 F (36.8 C)  TempSrc:      SpO2: 96% 97% 96% 96%  Weight:      Height:        Intake/Output Summary (Last 24 hours) at 12/17/2021 1736 Last data filed at 12/17/2021 1062 Gross per 24 hour  Intake --  Output 500 ml  Net -500 ml   Filed Weights   12/15/21 1026 12/15/21 1624  12/15/21 1921  Weight: 79.4 kg 79.4 kg 79.4 kg    Examination:  Constitutional:  VS as above General Appearance: alert, well-developed, well-nourished, NAD Respiratory: Normal respiratory effort No wheeze No rhonchi No rales Cardiovascular: S1/S2 normal No murmur No rub/gallop auscultated No lower extremity edema Gastrointestinal: No tenderness No masses No hernia appreciated Musculoskeletal:  No clubbing/cyanosis of digits Symmetrical movement in all extremities Neurological: No cranial nerve deficit on limited exam Alert Psychiatric: Normal judgment/insight Normal mood and affect       Scheduled Medications:   enoxaparin (LOVENOX) injection  40 mg Subcutaneous Q24H   melatonin  5 mg Oral QHS   senna-docusate  1 tablet Oral QHS    Continuous Infusions:   PRN Medications:  acetaminophen **OR** acetaminophen, diclofenac Sodium, hydrALAZINE, morphine injection, mouth rinse  Antimicrobials:  Anti-infectives (From admission, onward)    None           Data Reviewed: I have personally reviewed following labs and imaging studies  CBC: Recent Labs  Lab 12/15/21 1200  WBC 8.4  NEUTROABS 5.2  HGB 13.1  HCT 40.0  MCV 92.0  PLT 694   Basic Metabolic Panel: Recent Labs  Lab 12/15/21 1200  NA 133*  K 5.0  CL 100  CO2 22  GLUCOSE 98  BUN 17  CREATININE 0.61  CALCIUM 9.6   GFR: Estimated Creatinine Clearance: 49.6 mL/min (by C-G formula based on SCr of 0.61 mg/dL). Liver Function Tests: Recent Labs  Lab 12/15/21 1200  AST 29  ALT 12  ALKPHOS 47  BILITOT 0.7  PROT 7.5  ALBUMIN 3.9   No results for input(s): "LIPASE", "AMYLASE" in the last 168 hours. No results for input(s): "AMMONIA" in the last 168 hours. Coagulation Profile: No results for input(s): "INR", "PROTIME" in the last 168 hours. Cardiac Enzymes: No results for input(s): "CKTOTAL", "CKMB", "CKMBINDEX", "TROPONINI" in the last 168 hours. BNP (last 3 results) No  results for input(s): "PROBNP" in the last 8760 hours. HbA1C: No results for input(s): "HGBA1C" in the last 72 hours. CBG: No results for input(s): "GLUCAP" in the last 168 hours. Lipid Profile: No results for input(s): "CHOL", "HDL", "LDLCALC", "TRIG", "CHOLHDL", "LDLDIRECT" in the last 72 hours. Thyroid Function Tests: No results for input(s): "TSH", "T4TOTAL", "FREET4", "T3FREE", "THYROIDAB" in the last 72 hours. Anemia Panel: No results for input(s): "VITAMINB12", "FOLATE", "FERRITIN", "TIBC", "IRON", "RETICCTPCT" in the last 72 hours. Most Recent Urinalysis On File:   Sepsis Labs: '@LABRCNTIP'$ (procalcitonin:4,lacticidven:4)  No results found for this or any previous visit (from the past 240 hour(s)).       Radiology Studies: No results found.          LOS: 1 day     Emeterio Reeve, DO [facility] 12/17/2021, 5:36 PM    Dictation software may have been used to generate the above note. Typos may occur and escape review in typed/dictated notes. Please contact Dr Sheppard Coil directly for clarity if needed.  Staff  may message me via secure chat in [place]  but this may not receive an immediate response,  please page me for urgent matters!  If 7PM-7AM, please contact night coverage [email or web address]

## 2021-12-17 NOTE — Progress Notes (Signed)
Occupational Therapy Treatment Patient Details Name: Becky Shea MRN: [id number] DOB: Oct 07, 1931 Today's Date: 12/17/2021   History of present illness Pt is an 88 y/o F admitted on 12/15/21 after presenting with c/o L hip pain that began ~1 week ago following a fall outside. CT scan revealed inferior & superior pubic rami fxs. Ortho was consulted & Dr. Sabra Heck notesLLE to be PWB. PMH: arthritis, hearing loss, aortic atherosclerosis, neuropathy   OT comments  [name] was seen for OT treatment on this date. Upon arrival to room pt reclined in chair, RN at bedside for pain medicine, pt agreeable to tx. Pt requires MIN A don socks, MOD I doff in sitting. CGA + RW for ADL t/f and functional reaching tasks outside BOS, requires single UE support, 1 minor LOB without BUE support. Pt making good progress toward goals, will continue to follow POC. Discharge recommendation remains appropriate.     Recommendations for follow up therapy are one component of a multi-disciplinary discharge planning process, led by the attending physician.  Recommendations may be updated based on patient status, additional functional criteria and insurance authorization.    Follow Up Recommendations  Skilled nursing-short term rehab (<3 hours/day)     Assistance Recommended at Discharge Intermittent Supervision/Assistance  Patient can return home with the following  A little help with walking and/or transfers;A lot of help with bathing/dressing/bathroom;Help with stairs or ramp for entrance   Equipment Recommendations  BSC/3in1    Recommendations for Other Services      Precautions / Restrictions Precautions Precautions: Fall Restrictions Weight Bearing Restrictions: Yes LLE Weight Bearing: Partial weight bearing LLE Partial Weight Bearing Percentage or Pounds: 50       Mobility Bed Mobility               General bed mobility comments: NT    Transfers Overall transfer level: Needs  assistance Equipment used: Rolling walker (2 wheels) Transfers: Sit to/from Stand Sit to Stand: Min guard                 Balance Overall balance assessment: Needs assistance Sitting-balance support: No upper extremity supported, Feet supported Sitting balance-Leahy Scale: Good     Standing balance support: Single extremity supported, During functional activity Standing balance-Leahy Scale: Fair                             ADL either performed or assessed with clinical judgement   ADL Overall ADL's : Needs assistance/impaired                                       General ADL Comments: MIN A don socks, MOD I doff in sitting. CGA + RW for ADL t/f and functional reaching tasks outside BOS, requires single UE support, 1 minor LOB without BUE support      Cognition Arousal/Alertness: Awake/alert Behavior During Therapy: WFL for tasks assessed/performed Overall Cognitive Status: Within Functional Limits for tasks assessed                                           Pertinent Vitals/ Pain       Pain Assessment Pain Assessment: 0-10 Pain Score: 10-Worst pain ever Pain Location: L pelvis Pain Descriptors / Indicators: Discomfort, Guarding  Pain Intervention(s): Patient requesting pain meds-RN notified, RN gave pain meds during session, Repositioned   Frequency  Min 2X/week        Progress Toward Goals  OT Goals(current goals can now be found in the care plan section)  Progress towards OT goals: Progressing toward goals  Acute Rehab OT Goals Patient Stated Goal: to go home OT Goal Formulation: With patient Time For Goal Achievement: 12/30/21 Potential to Achieve Goals: Good ADL Goals Pt Will Perform Grooming: with modified independence;standing Pt Will Perform Lower Body Dressing: with set-up;with supervision;sit to/from stand Pt Will Transfer to Toilet: with modified independence;ambulating;regular height toilet   Plan Discharge plan remains appropriate;Frequency remains appropriate    Co-evaluation                 AM-PAC OT "6 Clicks" Daily Activity     Outcome Measure   Help from another person eating meals?: None Help from another person taking care of personal grooming?: A Little Help from another person toileting, which includes using toliet, bedpan, or urinal?: A Little Help from another person bathing (including washing, rinsing, drying)?: A Lot Help from another person to put on and taking off regular upper body clothing?: None Help from another person to put on and taking off regular lower body clothing?: A Lot 6 Click Score: 18    End of Session    OT Visit Diagnosis: Other abnormalities of gait and mobility (R26.89);Muscle weakness (generalized) (M62.81)   Activity Tolerance Patient tolerated treatment well   Patient Left in chair;with call bell/phone within reach   Nurse Communication          Time: 4010-2725 OT Time Calculation (min): 17 min  Charges: OT General Charges $OT Visit: 1 Visit OT Treatments $Self Care/Home Management : 8-22 mins  Dessie Coma, M.S. OTR/L  12/17/21, 11:54 AM  ascom [phone]

## 2021-12-17 NOTE — Plan of Care (Signed)

## 2021-12-17 NOTE — Progress Notes (Signed)
Subjective:    Patient is out of bed in a chair.  Still has pain with ambulation but is working with PT.  She feels that she does need to go to rehab.  Patient reports pain as moderate.  Objective:   VITALS:   Vitals:   12/16/21 2352 12/17/21 0832  BP: (!) 122/90 (!) 171/89  Pulse: 80 91  Resp: 17 17  Temp: 97.9 F (36.6 C) 98.2 F (36.8 C)  SpO2: 97% 96%    Neurologically intact  LABS Recent Labs    12/15/21 1200  HGB 13.1  HCT 40.0  WBC 8.4  PLT 253    Recent Labs    12/15/21 1200  NA 133*  K 5.0  BUN 17  CREATININE 0.61  GLUCOSE 98    No results for input(s): "LABPT", "INR" in the last 72 hours.   Assessment/Plan:      Up with therapy Good candidate for skilled nursing rehab for short-term. 81 mg aspirin twice daily for 6 weeks. Follow-up in my office in 2 weeks after discharge.

## 2021-12-17 NOTE — Progress Notes (Signed)
Physical Therapy Treatment Patient Details Name: Becky Shea MRN: [id number] DOB: December 27, 1931 Today's Date: 12/17/2021   History of Present Illness Pt is an 88 y/o F admitted on 12/15/21 after presenting with c/o L hip pain that began ~1 week ago following a fall outside. CT scan revealed inferior & superior pubic rami fxs. Ortho was consulted & Dr. Sabra Heck notesLLE to be PWB. PMH: arthritis, hearing loss, aortic atherosclerosis, neuropathy    PT Comments    Pt in bed, ready to get up.  She is able to get to EOB with min a x 1 and increased time.  Once sitting she is generally steady.  Stood with min a x 1 and c/o increased pain today and unable to progress gait at this time.  RLE AROM in standing x 10 and after short seated rest she is able to transfer to chair at bedside with min assist.  She remains in chair for breakfast and RN is contacted as pt requests pain medication.   SNF remains appropriate for discharge.    Recommendations for follow up therapy are one component of a multi-disciplinary discharge planning process, led by the attending physician.  Recommendations may be updated based on patient status, additional functional criteria and insurance authorization.  Follow Up Recommendations  Skilled nursing-short term rehab (<3 hours/day)     Assistance Recommended at Discharge Intermittent Supervision/Assistance  Patient can return home with the following A little help with walking and/or transfers;A little help with bathing/dressing/bathroom;Help with stairs or ramp for entrance   Equipment Recommendations  Rolling walker (2 wheels);BSC/3in1    Recommendations for Other Services       Precautions / Restrictions Precautions Precautions: Fall Restrictions Weight Bearing Restrictions: Yes LLE Weight Bearing: Partial weight bearing LLE Partial Weight Bearing Percentage or Pounds: 50     Mobility  Bed Mobility Overal bed mobility: Needs Assistance Bed Mobility:  Supine to Sit     Supine to sit: Mod assist     General bed mobility comments: increased due to pain.    Transfers Overall transfer level: Needs assistance Equipment used: Rolling walker (2 wheels) Transfers: Sit to/from Stand Sit to Stand: Min assist   Step pivot transfers: Min assist       General transfer comment: cues for hand placements but she still opts to pull up on walker    Ambulation/Gait Ambulation/Gait assistance: Min assist Gait Distance (Feet): 3 Feet Assistive device: Rolling walker (2 wheels) Gait Pattern/deviations: Decreased step length - left, Decreased step length - right, Decreased dorsiflexion - left, Decreased dorsiflexion - right, Decreased stride length       General Gait Details: limited by pain today.  R   Stairs             Wheelchair Mobility    Modified Rankin (Stroke Patients Only)       Balance Overall balance assessment: Needs assistance Sitting-balance support: No upper extremity supported, Feet supported Sitting balance-Leahy Scale: Good     Standing balance support: Bilateral upper extremity supported Standing balance-Leahy Scale: Fair                              Cognition Arousal/Alertness: Awake/alert Behavior During Therapy: WFL for tasks assessed/performed Overall Cognitive Status: Within Functional Limits for tasks assessed  Exercises Other Exercises Other Exercises: standing R LE AROM for pre-gait    General Comments        Pertinent Vitals/Pain Pain Assessment Pain Assessment: Faces Faces Pain Scale: Hurts whole lot Pain Location: L pelvis Pain Descriptors / Indicators: Discomfort, Guarding Pain Intervention(s): Limited activity within patient's tolerance, Monitored during session, Patient requesting pain meds-RN notified    Home Living                          Prior Function            PT Goals (current  goals can now be found in the care plan section) Progress towards PT goals: Progressing toward goals    Frequency    7X/week      PT Plan Current plan remains appropriate    Co-evaluation              AM-PAC PT "6 Clicks" Mobility   Outcome Measure  Help needed turning from your back to your side while in a flat bed without using bedrails?: None Help needed moving from lying on your back to sitting on the side of a flat bed without using bedrails?: A Little Help needed moving to and from a bed to a chair (including a wheelchair)?: A Little Help needed standing up from a chair using your arms (e.g., wheelchair or bedside chair)?: A Little Help needed to walk in hospital room?: A Lot Help needed climbing 3-5 steps with a railing? : A Lot 6 Click Score: 17    End of Session Equipment Utilized During Treatment: Gait belt Activity Tolerance: Patient limited by pain Patient left: in chair;with chair alarm set;with call bell/phone within reach Nurse Communication: Mobility status PT Visit Diagnosis: Difficulty in walking, not elsewhere classified (R26.2);Muscle weakness (generalized) (M62.81);Pain;Other abnormalities of gait and mobility (R26.89) Pain - Right/Left: Left     Time: 8768-1157 PT Time Calculation (min) (ACUTE ONLY): 11 min  Charges:  $Therapeutic Activity: 8-22 mins                    Chesley Noon, PTA 12/17/21, 1:32 PM

## 2021-12-18 DIAGNOSIS — S32591A Other specified fracture of right pubis, initial encounter for closed fracture: Secondary | ICD-10-CM | POA: Diagnosis not present

## 2021-12-18 MED ORDER — ASPIRIN 81 MG PO TBEC: 81.0000 mg | DELAYED_RELEASE_TABLET | Freq: Two times a day (BID) | ORAL | 0 refills | Status: DC

## 2021-12-18 MED ORDER — DICLOFENAC SODIUM 1 % EX GEL: 2.0000 g | Freq: Three times a day (TID) | CUTANEOUS | Status: AC | PRN

## 2021-12-18 MED ORDER — OXYCODONE HCL 5 MG PO TABS: 5.0000 mg | ORAL_TABLET | Freq: Four times a day (QID) | ORAL | 0 refills | Status: DC | PRN

## 2021-12-18 MED ORDER — SENNOSIDES-DOCUSATE SODIUM 8.6-50 MG PO TABS: 1.0000 | ORAL_TABLET | Freq: Every day | ORAL | 0 refills | Status: DC

## 2021-12-18 NOTE — Discharge Summary (Signed)
Physician Discharge Summary  Becky Shea TOI:[id number] DOB: 1931/06/30 DOA: 12/15/2021  PCP: [name], MD  Admit date: 12/15/2021 Discharge date: 12/19/2021  Admitted From: Home Disposition:  SNF  Discharge Condition:Stable CODE STATUS:FULL Diet recommendation: Heart Healthy  Brief/Interim Summary:    Discharge Diagnoses:  Patient  is a 88 year old female with history of arthritis, bilateral hearing loss, aortic atherosclerosis, neuropathy, who presents emergency department for chief concerns of left-sided hip pain. She reports that approximately 1 week ago 12/08/21 she was in the garden trimming, and she had a wheelbarrow, and she tripped and fell. She was seen at urgent care the following day 11/13 and had an x-ray which showed no hip fracture. FOllowed w/ [facility] had a CT scan 11/18 that showed inferior and superior pubic rami fractures. She reports that initially she was still able to ambulate however it gradually got worse.  Orthopedics was consulted.  Recommended supportive care, conservative management.[name]  PT/OT recommended SNF on discharge.  Can be discharged to SNF whenever possible.  She needs to follow-up with orthopedics in 2 weeks.  Following problems were addressed during her hospitalization:  Closed fracture of multiple pubic rami, right, initial encounter [facility] [place] Orthopedics consulted and following.  Recommended conservative management, weightbearing as tolerated, outpatient follow-up.  Pain control. PT/OT recommended SNF on discharge.  Patient was initially reluctant and wanted to go home now agreeable for rehab.   Essential hypertension No prior diagnosis, presumed secondary to pain Currently blood pressure stable. Likely contributed by pain.   At risk for constipation Senna docusate 1 tablet nightly resumed   Primary osteoarthritis of both knees Continue supportive care, pain management    Discharge Instructions  Discharge Instructions      Diet - low sodium heart healthy   Complete by: As directed    Discharge instructions   Complete by: As directed    1)Please take prescribed medications as instructed 2)Follow up with orthopedics as an outpatient in 2 weeks. Name and number of the provider has been attached   Increase activity slowly   Complete by: As directed    Increase activity slowly   Complete by: As directed       Allergies as of 12/19/2021       Reactions   Poison Ivy Extract Rash   Poison Oak Extract Rash   Poison Sumac Extract Rash        Medication List     STOP taking these medications    ibuprofen 400 MG tablet Commonly known as: ADVIL   traMADol 50 MG tablet Commonly known as: ULTRAM       TAKE these medications    acetaminophen 500 MG tablet Commonly known as: TYLENOL Take 1,000 mg by mouth every 6 (six) hours as needed for moderate pain.   alendronate 70 MG tablet Commonly known as: FOSAMAX TAKE 1 TABLET BY MOUTH EVERY 7 (SEVEN) DAYS WITH A FULL GLASS OF WATER ON AN EMPTY STOMACH.   aspirin EC 81 MG tablet Take 1 tablet (81 mg total) by mouth 2 (two) times daily. Swallow whole.   AZO-CRANBERRY PO Take 2 tablets by mouth daily before breakfast.   cyanocobalamin 1000 MCG tablet Commonly known as: VITAMIN B12 Take 1,000 mcg by mouth daily.   diclofenac Sodium 1 % Gel Commonly known as: VOLTAREN Apply 2 g topically 3 (three) times daily as needed (Joint pain, osteoarthritis pain at the joint).   gabapentin 100 MG capsule Commonly known as: NEURONTIN Take 1 capsule (100 mg total) by  mouth at bedtime. Take 1 capsule (100 mg total) by mouth nightly- Dr Tellhon/ spine center   KRILL OIL PO Take 350 mg by mouth daily.   Magnesium Gluconate 250 MG Tabs Take 1 tablet by mouth daily.   melatonin 3 MG Tabs tablet Take 1 tablet by mouth at bedtime.   Multi-Vitamin tablet Take 1 tablet by mouth daily.   OSTEO BI-FLEX ADV DOUBLE ST PO Take 3 tablets by mouth daily.    oxyCODONE 5 MG immediate release tablet Commonly known as: Roxicodone Take 1 tablet (5 mg total) by mouth every 6 (six) hours as needed for severe pain.   PRESERVISION AREDS 2+MULTI VIT PO Take by mouth.   senna-docusate 8.6-50 MG tablet Commonly known as: Senokot-S Take 1 tablet by mouth at bedtime. While on opiate pain medication   Tylenol PM Extra Strength 25-500 MG Tabs tablet Generic drug: diphenhydramine-acetaminophen Take 1 tablet by mouth at bedtime as needed.               Durable Medical Equipment  (From admission, onward)           Start     Ordered   12/16/21 1533  For home use only DME Walker rolling  Once       Question Answer Comment  Walker: With [place] Wheels   Patient needs a walker to treat with the following condition Pubic ramus fracture ([place])      12/16/21 1532   12/16/21 1533  For home use only DME Bedside commode  Once       Question:  Patient needs a bedside commode to treat with the following condition  Answer:  Pubic ramus fracture ([place])   12/16/21 1532   12/16/21 1311  For home use only DME Walker rolling  Once       Question Answer Comment  Walker: With [place]   Patient needs a walker to treat with the following condition Pubic ramus fracture ([place])      12/16/21 1310            Follow-up Information     [name], MD Follow up on 01/01/2022.   Specialty: Orthopedic Surgery Why: at 1045 Contact information: [place] [address] [phone]         [name], MD Follow up.   Specialty: Family Medicine Contact information: [address] [place] [address] [phone]                Allergies  Allergen Reactions   Poison Ivy Extract Rash   Poison Oak Extract Rash   Poison Sumac Extract Rash    Consultations: Orthopedics   Procedures/Studies: CT PELVIS WO CONTRAST  Result Date: 12/14/2021 CLINICAL DATA:  Hip pain after fall. EXAM: CT PELVIS  WITHOUT CONTRAST TECHNIQUE: Multidetector CT imaging of the pelvis was performed following the standard protocol without intravenous contrast. RADIATION DOSE REDUCTION: This exam was performed according to the departmental dose-optimization program which includes automated exposure control, adjustment of the mA and/or kV according to patient size and/or use of iterative reconstruction technique. COMPARISON:  Left hip x-ray 12/09/2021. FINDINGS: Urinary Tract:  No abnormality visualized. Bowel:  Unremarkable visualized pelvic bowel loops. Vascular/Lymphatic: There are atherosclerotic calcifications of the aorta and iliac arteries. No enlarged lymph nodes are seen. Reproductive:  The uterus and adnexa are within normal limits. Other:  There is no free fluid or focal abdominal wall hernia. Musculoskeletal: The bones are diffusely osteopenic.  There is a nondisplaced fracture of the medial left superior pubic ramus extending to the pubic symphysis. There is no pubic symphysis diastasis. There is also nondisplaced fracture of the left inferior pubic ramus. There are moderate degenerative changes of the left hip and severe degenerative changes of the visualized lower lumbar spine. Right hip arthroplasty appears uncomplicated. No focal hematoma. IMPRESSION: 1. Nondisplaced fractures of the left superior and inferior pubic rami. 2. Osteopenia. Aortic Atherosclerosis (ICD10-I70.0). Electronically Signed   By: Ronney Asters M.D.   On: 12/14/2021 17:09   DG Hip Unilat With Pelvis 2-3 Views Left  Result Date: 12/09/2021 CLINICAL DATA:  Fall, hip pain EXAM: DG HIP (WITH OR WITHOUT PELVIS) 2-3V LEFT COMPARISON:  07/13/2018 FINDINGS: Diffuse osseous demineralization. Prior right total hip arthroplasty. Left hip joint intact without evidence of fracture or dislocation. Moderate degenerative changes of the left hip. No appreciable soft tissue abnormality. IMPRESSION: 1. No acute osseous abnormality of the left hip. If high  clinical suspicion for fracture remains, a CT or MRI could be considered as subtle nondisplaced fractures can be occult in the setting of bony demineralization. 2. Moderate degenerative changes of the left hip. Electronically Signed   By: Davina Poke D.O.   On: 12/09/2021 12:47      Subjective: Patient seen and examined at bedside.  Hemodynamically stable.  Sitting in the chair.  Overall comfortable.  Denies significant pain.She  had a bowel movement this mrng  Discharge Exam: Vitals:   12/19/21 0047 12/19/21 0815  BP: (!) 143/77 (!) 158/96  Pulse: 68 78  Resp: 18 17  Temp: 97.7 F (36.5 C) 97.6 F (36.4 C)  SpO2: 96% 96%   Vitals:   12/18/21 0822 12/18/21 1533 12/19/21 0047 12/19/21 0815  BP: (!) 145/80 104/64 (!) 143/77 (!) 158/96  Pulse: 74 72 68 78  Resp: '16 16 18 17  '$ Temp: 98.4 F (36.9 C) 98.4 F (36.9 C) 97.7 F (36.5 C) 97.6 F (36.4 C)  TempSrc:    Oral  SpO2: 96% 96% 96% 96%  Weight:      Height:        General: Pt is alert, awake, not in acute distress,pleasant elderly female,obese Cardiovascular: RRR, S1/S2 +, no rubs, no gallops Respiratory: CTA bilaterally, no wheezing, no rhonchi Abdominal: Soft, NT, ND, bowel sounds + Extremities: no edema, no cyanosis    The results of significant diagnostics from this hospitalization (including imaging, microbiology, ancillary and laboratory) are listed below for reference.     Microbiology: No results found for this or any previous visit (from the past 240 hour(s)).   Labs: BNP (last 3 results) No results for input(s): "BNP" in the last 8760 hours. Basic Metabolic Panel: Recent Labs  Lab 12/15/21 1200  NA 133*  K 5.0  CL 100  CO2 22  GLUCOSE 98  BUN 17  CREATININE 0.61  CALCIUM 9.6   Liver Function Tests: Recent Labs  Lab 12/15/21 1200  AST 29  ALT 12  ALKPHOS 47  BILITOT 0.7  PROT 7.5  ALBUMIN 3.9   No results for input(s): "LIPASE", "AMYLASE" in the last 168 hours. No results for  input(s): "AMMONIA" in the last 168 hours. CBC: Recent Labs  Lab 12/15/21 1200 12/19/21 0551  WBC 8.4 6.7  NEUTROABS 5.2  --   HGB 13.1 12.8  HCT 40.0 37.2  MCV 92.0 89.2  PLT 253 313   Cardiac Enzymes: No results for input(s): "CKTOTAL", "CKMB", "CKMBINDEX", "TROPONINI" in the last 168 hours.  BNP: Invalid input(s): "POCBNP" CBG: No results for input(s): "GLUCAP" in the last 168 hours. D-Dimer No results for input(s): "DDIMER" in the last 72 hours. Hgb A1c No results for input(s): "HGBA1C" in the last 72 hours. Lipid Profile No results for input(s): "CHOL", "HDL", "LDLCALC", "TRIG", "CHOLHDL", "LDLDIRECT" in the last 72 hours. Thyroid function studies No results for input(s): "TSH", "T4TOTAL", "T3FREE", "THYROIDAB" in the last 72 hours.  Invalid input(s): "FREET3" Anemia work up No results for input(s): "VITAMINB12", "FOLATE", "FERRITIN", "TIBC", "IRON", "RETICCTPCT" in the last 72 hours. Urinalysis    Component Value Date/Time   COLORURINE YELLOW (A) 07/15/2018 0842   APPEARANCEUR CLEAR (A) 07/15/2018 0842   APPEARANCEUR CLEAR 04/12/2013 1627   LABSPEC 1.013 07/15/2018 0842   LABSPEC 1.010 04/12/2013 1627   PHURINE 7.0 07/15/2018 0842   GLUCOSEU NEGATIVE 07/15/2018 0842   GLUCOSEU NEGATIVE 04/12/2013 1627   HGBUR MODERATE (A) 07/15/2018 0842   BILIRUBINUR negative 08/24/2018 1444   BILIRUBINUR NEGATIVE 04/12/2013 1627   KETONESUR NEGATIVE 07/15/2018 0842   PROTEINUR Negative 08/24/2018 1444   PROTEINUR NEGATIVE 07/15/2018 0842   UROBILINOGEN 0.2 08/24/2018 1444   NITRITE negative 08/24/2018 1444   NITRITE NEGATIVE 07/15/2018 0842   LEUKOCYTESUR Large (3+) (A) 08/24/2018 1444   LEUKOCYTESUR NEGATIVE 07/15/2018 0842   LEUKOCYTESUR 1+ 04/12/2013 1627   Sepsis Labs Recent Labs  Lab 12/15/21 1200 12/19/21 0551  WBC 8.4 6.7   Microbiology No results found for this or any previous visit (from the past 240 hour(s)).  Please note: You were cared for by a  hospitalist during your hospital stay. Once you are discharged, your primary care physician will handle any further medical issues. Please note that NO REFILLS for any discharge medications will be authorized once you are discharged, as it is imperative that you return to your primary care physician (or establish a relationship with a primary care physician if you do not have one) for your post hospital discharge needs so that they can reassess your need for medications and monitor your lab values.    Time coordinating discharge: 40 minutes  SIGNED:   Shelly Coss, MD  [facility] 12/19/2021, 11:45 AM Pager [phone]  If 7PM-7AM, please contact night-coverage [email or web address] Password [email or web address]

## 2021-12-18 NOTE — Plan of Care (Signed)

## 2021-12-18 NOTE — Progress Notes (Signed)
Physical Therapy Treatment Patient Details Name: Becky Shea MRN: [id number] DOB: 04-27-1931 Today's Date: 12/18/2021   History of Present Illness Pt is an 88 y/o F admitted on 12/15/21 after presenting with c/o L hip pain that began ~1 week ago following a fall outside. CT scan revealed inferior & superior pubic rami fxs. Ortho was consulted & Dr. Sabra Heck notesLLE to be PWB. PMH: arthritis, hearing loss, aortic atherosclerosis, neuropathy    PT Comments    Pt received in recliner, very pleasant with family in room. Discussed POC and role of PT with good understanding. Pt able to demonstrate sit to stand with CGA and vc's for hand placement. Gait training with RW 44f with RW, PWB L LE, and CGA. Improved distance this visit, pt appears to be tolerating more activity in standing. Continue to recommend short term stay at SNF once bed available.    Recommendations for follow up therapy are one component of a multi-disciplinary discharge planning process, led by the attending physician.  Recommendations may be updated based on patient status, additional functional criteria and insurance authorization.  Follow Up Recommendations  Skilled nursing-short term rehab (<3 hours/day) Can patient physically be transported by private vehicle: Yes   Assistance Recommended at Discharge Intermittent Supervision/Assistance  Patient can return home with the following A little help with walking and/or transfers;A little help with bathing/dressing/bathroom;Help with stairs or ramp for entrance   Equipment Recommendations  Rolling walker (2 wheels);BSC/3in1    Recommendations for Other Services OT consult     Precautions / Restrictions Precautions Precautions: Fall Restrictions Weight Bearing Restrictions: Yes LLE Weight Bearing: Partial weight bearing LLE Partial Weight Bearing Percentage or Pounds: 50     Mobility  Bed Mobility Overal bed mobility: Needs Assistance Bed Mobility: Supine  to Sit     Supine to sit: Mod assist     General bed mobility comments: NT    Transfers Overall transfer level: Needs assistance Equipment used: Rolling walker (2 wheels) Transfers: Sit to/from Stand Sit to Stand: Min guard Stand pivot transfers: Min assist         General transfer comment: cues for hand placements but she still opts to pull up on walker    Ambulation/Gait Ambulation/Gait assistance: Min guard Gait Distance (Feet):  (15) Assistive device: Rolling walker (2 wheels) Gait Pattern/deviations: Decreased step length - left, Decreased step length - right, Decreased dorsiflexion - left, Decreased dorsiflexion - right, Decreased stride length Gait velocity: slightly decreased     General Gait Details: good progression this date   Stairs             Wheelchair Mobility    Modified Rankin (Stroke Patients Only)       Balance Overall balance assessment: Needs assistance Sitting-balance support: No upper extremity supported, Feet supported Sitting balance-Leahy Scale: Good Sitting balance - Comments: supervision sitting EOB   Standing balance support: Bilateral upper extremity supported Standing balance-Leahy Scale: Fair                              Cognition Arousal/Alertness: Awake/alert Behavior During Therapy: WFL for tasks assessed/performed Overall Cognitive Status: Within Functional Limits for tasks assessed                                 General Comments:  (Very pleasant)        Exercises General Exercises - Lower  Extremity Ankle Circles/Pumps: AROM, Both, 10 reps Long Arc Quad: AROM, Both, 10 reps    General Comments General comments (skin integrity, edema, etc.):  (Pt educated on benefits of short term stay at SNF and importance of compliance)      Pertinent Vitals/Pain Pain Assessment Pain Assessment: Faces Faces Pain Scale: Hurts little more Pain Location: L pelvis Pain Descriptors /  Indicators: Discomfort, Guarding Pain Intervention(s): Monitored during session, Limited activity within patient's tolerance    Home Living                          Prior Function            PT Goals (current goals can now be found in the care plan section) Acute Rehab PT Goals Patient Stated Goal: go home, return to PLOF Progress towards PT goals: Progressing toward goals    Frequency    7X/week      PT Plan Current plan remains appropriate    Co-evaluation              AM-PAC PT "6 Clicks" Mobility   Outcome Measure  Help needed turning from your back to your side while in a flat bed without using bedrails?: None Help needed moving from lying on your back to sitting on the side of a flat bed without using bedrails?: A Little Help needed moving to and from a bed to a chair (including a wheelchair)?: A Little Help needed standing up from a chair using your arms (e.g., wheelchair or bedside chair)?: A Little Help needed to walk in hospital room?: A Lot Help needed climbing 3-5 steps with a railing? : A Lot 6 Click Score: 17    End of Session Equipment Utilized During Treatment: Gait belt Activity Tolerance: Patient limited by fatigue Patient left: in chair;with call bell/phone within reach;with chair alarm set;with family/visitor present Nurse Communication: Mobility status PT Visit Diagnosis: Difficulty in walking, not elsewhere classified (R26.2);Muscle weakness (generalized) (M62.81);Pain;Other abnormalities of gait and mobility (R26.89) Pain - Right/Left: Left Pain - part of body:  (pelvis)     Time: 1308-6578 PT Time Calculation (min) (ACUTE ONLY): 28 min  Charges:  $Gait Training: 8-22 mins $Therapeutic Exercise: 8-22 mins                    Mikel Cella, PTA   Josie Dixon 12/18/2021, 4:20 PM

## 2021-12-19 LAB — CBC
HCT: 37.2 % (ref 36.0–46.0)
Hemoglobin: 12.8 g/dL (ref 12.0–15.0)
MCH: 30.7 pg (ref 26.0–34.0)
MCHC: 34.4 g/dL (ref 30.0–36.0)
MCV: 89.2 fL (ref 80.0–100.0)
Platelets: 313 10*3/uL (ref 150–400)
RBC: 4.17 MIL/uL (ref 3.87–5.11)
RDW: 12.8 % (ref 11.5–15.5)
WBC: 6.7 10*3/uL (ref 4.0–10.5)
nRBC: 0 % (ref 0.0–0.2)

## 2021-12-19 MED ORDER — OXYCODONE HCL 5 MG PO TABS
5.0000 mg | ORAL_TABLET | Freq: Four times a day (QID) | ORAL | Status: DC | PRN
  Administered 2021-12-19 – 2021-12-20 (×6): 5 mg via ORAL
  Filled 2021-12-19 (×7): qty 1

## 2021-12-19 NOTE — Progress Notes (Signed)
Patient is seen and examined at bedside today. She looks comfortable, sitting in the chair.  She had a bowel movement today.  Does not have any complaints today. Patient is medically stable for discharge whenever possible.  Discharge summary and orders already in place.  No change in the  medical management. I called and discussed with the daughter on phone today.

## 2021-12-19 NOTE — Plan of Care (Signed)
  Problem: Education: Goal: Knowledge of General Education information will improve Description: Including pain rating scale, medication(s)/side effects and non-pharmacologic comfort measures Outcome: Progressing   Problem: Clinical Measurements: Goal: Ability to maintain clinical measurements within normal limits will improve Outcome: Progressing   Problem: Elimination: Goal: Will not experience complications related to bowel motility Outcome: Progressing   Problem: Coping: Goal: Level of anxiety will decrease Outcome: Progressing   Problem: Nutrition: Goal: Adequate nutrition will be maintained Outcome: Progressing   Problem: Activity: Goal: Risk for activity intolerance will decrease Outcome: Progressing   Problem: Pain Managment: Goal: General experience of comfort will improve Outcome: Progressing   Problem: Safety: Goal: Ability to remain free from injury will improve Outcome: Progressing

## 2021-12-19 NOTE — Progress Notes (Signed)
Physical Therapy Treatment Patient Details Name: Becky Shea MRN: [id number] DOB: Apr 17, 1931 Today's Date: 12/19/2021   History of Present Illness Pt is an 88 y/o F admitted on 12/15/21 after presenting with c/o L hip pain that began ~1 week ago following a fall outside. CT scan revealed inferior & superior pubic rami fxs. Ortho was consulted & Dr. Sabra Heck notesLLE to be PWB. PMH: arthritis, hearing loss, aortic atherosclerosis, neuropathy    PT Comments    Pt up with nursing this am.  She was pre-medicated for session and appreciative of pain meds.  She is able to stand and walk 35' in room with slow but generally steady gait.  After short seated rest, she is able to do seated and standing ex.  Overall improved tolerance but remains SNF appropriate upon discharge.   Recommendations for follow up therapy are one component of a multi-disciplinary discharge planning process, led by the attending physician.  Recommendations may be updated based on patient status, additional functional criteria and insurance authorization.  Follow Up Recommendations  Skilled nursing-short term rehab (<3 hours/day)     Assistance Recommended at Discharge Intermittent Supervision/Assistance  Patient can return home with the following A little help with walking and/or transfers;A little help with bathing/dressing/bathroom;Help with stairs or ramp for entrance;Assistance with cooking/housework   Equipment Recommendations  Rolling walker (2 wheels);BSC/3in1    Recommendations for Other Services       Precautions / Restrictions Precautions Precautions: Fall Restrictions Weight Bearing Restrictions: Yes LLE Weight Bearing: Partial weight bearing LLE Partial Weight Bearing Percentage or Pounds: 50     Mobility  Bed Mobility               General bed mobility comments: in chair before session    Transfers Overall transfer level: Needs assistance Equipment used: Rolling walker (2  wheels) Transfers: Sit to/from Stand Sit to Stand: Min guard                Ambulation/Gait Ambulation/Gait assistance: Min guard Gait Distance (Feet): 35 Feet Assistive device: Rolling walker (2 wheels) Gait Pattern/deviations: Decreased dorsiflexion - left, Decreased stance time - right Gait velocity: slightly decreased     General Gait Details: good progression this date   Stairs             Wheelchair Mobility    Modified Rankin (Stroke Patients Only)       Balance Overall balance assessment: Needs assistance Sitting-balance support: No upper extremity supported, Feet supported Sitting balance-Leahy Scale: Good     Standing balance support: Bilateral upper extremity supported Standing balance-Leahy Scale: Fair Standing balance comment: reliant on walker for support                            Cognition Arousal/Alertness: Awake/alert Behavior During Therapy: WFL for tasks assessed/performed Overall Cognitive Status: Within Functional Limits for tasks assessed                                          Exercises Other Exercises Other Exercises: seated and standing AROM RLE    General Comments        Pertinent Vitals/Pain Pain Assessment Pain Assessment: Faces Faces Pain Scale: Hurts little more Pain Location: L pelvis - feels better with pain meds Pain Descriptors / Indicators: Discomfort, Guarding Pain Intervention(s): Limited activity within patient's tolerance, Monitored  during session, Premedicated before session    Home Living                          Prior Function            PT Goals (current goals can now be found in the care plan section) Progress towards PT goals: Progressing toward goals    Frequency    7X/week      PT Plan Current plan remains appropriate    Co-evaluation              AM-PAC PT "6 Clicks" Mobility   Outcome Measure  Help needed turning from your back  to your side while in a flat bed without using bedrails?: None Help needed moving from lying on your back to sitting on the side of a flat bed without using bedrails?: A Little Help needed moving to and from a bed to a chair (including a wheelchair)?: A Little Help needed standing up from a chair using your arms (e.g., wheelchair or bedside chair)?: A Little Help needed to walk in hospital room?: A Little Help needed climbing 3-5 steps with a railing? : A Lot 6 Click Score: 18    End of Session Equipment Utilized During Treatment: Gait belt Activity Tolerance: Patient limited by fatigue Patient left: in chair;with call bell/phone within reach;with chair alarm set;with family/visitor present Nurse Communication: Mobility status PT Visit Diagnosis: Difficulty in walking, not elsewhere classified (R26.2);Muscle weakness (generalized) (M62.81);Pain;Other abnormalities of gait and mobility (R26.89) Pain - Right/Left: Left Pain - part of body:  (pelvis)     Time: 5366-4403 PT Time Calculation (min) (ACUTE ONLY): 19 min  Charges:  $Gait Training: 8-22 mins                   Chesley Noon, PTA 12/19/21, 9:50 AM

## 2021-12-20 DIAGNOSIS — S32591A Other specified fracture of right pubis, initial encounter for closed fracture: Secondary | ICD-10-CM | POA: Diagnosis not present

## 2021-12-20 MED ORDER — ADULT MULTIVITAMIN W/MINERALS CH
1.0000 | ORAL_TABLET | Freq: Every day | ORAL | Status: DC
  Administered 2021-12-20: 1 via ORAL
  Filled 2021-12-20 (×2): qty 1

## 2021-12-20 MED ORDER — VITAMIN B-12 1000 MCG PO TABS
1000.0000 ug | ORAL_TABLET | Freq: Every day | ORAL | Status: DC
  Administered 2021-12-20: 1000 ug via ORAL
  Filled 2021-12-20 (×2): qty 1

## 2021-12-20 NOTE — Progress Notes (Signed)
Physical Therapy Treatment Patient Details Name: Becky Shea MRN: [id number] DOB: 25-Aug-1931 Today's Date: 12/20/2021   History of Present Illness Pt is an 88 y/o F admitted on 12/15/21 after presenting with c/o L hip pain that began ~1 week ago following a fall outside. CT scan revealed inferior & superior pubic rami fxs. Ortho was consulted & Dr. Sabra Heck notesLLE to be PWB. PMH: arthritis, hearing loss, aortic atherosclerosis, neuropathy    PT Comments    Well slow progression this date reporting she just woke up. Agreeable to sit in recliner for meal tray and agreeable to limited ambulation in room. Continues to needs assist for B LE management with time given for progression of independence. SNF continues to be appropriate unless pt is able to arrange increased supervision at home. Will continue to progress.   Recommendations for follow up therapy are one component of a multi-disciplinary discharge planning process, led by the attending physician.  Recommendations may be updated based on patient status, additional functional criteria and insurance authorization.  Follow Up Recommendations  Skilled nursing-short term rehab (<3 hours/day) Can patient physically be transported by private vehicle: Yes   Assistance Recommended at Discharge Intermittent Supervision/Assistance  Patient can return home with the following A little help with walking and/or transfers;A little help with bathing/dressing/bathroom;Help with stairs or ramp for entrance;Assistance with cooking/housework   Equipment Recommendations  Rolling walker (2 wheels);BSC/3in1    Recommendations for Other Services       Precautions / Restrictions Precautions Precautions: Fall Restrictions Weight Bearing Restrictions: Yes LLE Weight Bearing: Partial weight bearing LLE Partial Weight Bearing Percentage or Pounds: 50     Mobility  Bed Mobility Overal bed mobility: Needs Assistance Bed Mobility: Supine to Sit      Supine to sit: Min assist     General bed mobility comments: needs bed controls to assist mobility. Also needs min assist for B LEs. Very slow movment progression. Once seated at EOB, upright posture    Transfers Overall transfer level: Needs assistance Equipment used: Rolling walker (2 wheels) Transfers: Sit to/from Stand Sit to Stand: Min assist           General transfer comment: cues for hand placement and reports she wants to pull up on RW. Once standing, upright posture noted    Ambulation/Gait Ambulation/Gait assistance: Min guard Gait Distance (Feet): 20 Feet Assistive device: Rolling walker (2 wheels) Gait Pattern/deviations: Decreased dorsiflexion - left, Decreased stance time - right       General Gait Details: decreased ambulation tolerance with pt reporting she didn't want to go "uphill". Very slow gait speed and effortful. Good tolerance with WBing.   Stairs             Wheelchair Mobility    Modified Rankin (Stroke Patients Only)       Balance Overall balance assessment: Needs assistance Sitting-balance support: No upper extremity supported, Feet supported Sitting balance-Leahy Scale: Good     Standing balance support: Bilateral upper extremity supported Standing balance-Leahy Scale: Fair                              Cognition Arousal/Alertness: Awake/alert Behavior During Therapy: WFL for tasks assessed/performed Overall Cognitive Status: Within Functional Limits for tasks assessed  Exercises Other Exercises Other Exercises: pt reports she has already completed her exercises this morning.    General Comments        Pertinent Vitals/Pain Pain Assessment Pain Assessment: Faces Faces Pain Scale: Hurts a little bit Pain Location: L pelvis - feels better with pain meds Pain Descriptors / Indicators: Discomfort, Guarding Pain Intervention(s): Limited activity  within patient's tolerance, Premedicated before session, Repositioned    Home Living                          Prior Function            PT Goals (current goals can now be found in the care plan section) Acute Rehab PT Goals Patient Stated Goal: go home, return to PLOF PT Goal Formulation: With patient Time For Goal Achievement: 12/29/21 Potential to Achieve Goals: Good Progress towards PT goals: Progressing toward goals    Frequency    7X/week      PT Plan Current plan remains appropriate    Co-evaluation              AM-PAC PT "6 Clicks" Mobility   Outcome Measure  Help needed turning from your back to your side while in a flat bed without using bedrails?: None Help needed moving from lying on your back to sitting on the side of a flat bed without using bedrails?: A Little Help needed moving to and from a bed to a chair (including a wheelchair)?: A Little Help needed standing up from a chair using your arms (e.g., wheelchair or bedside chair)?: A Little Help needed to walk in hospital room?: A Little Help needed climbing 3-5 steps with a railing? : A Lot 6 Click Score: 18    End of Session Equipment Utilized During Treatment: Gait belt Activity Tolerance: Patient limited by fatigue Patient left: in chair;with call bell/phone within reach;with chair alarm set Nurse Communication: Mobility status PT Visit Diagnosis: Difficulty in walking, not elsewhere classified (R26.2);Muscle weakness (generalized) (M62.81);Pain;Other abnormalities of gait and mobility (R26.89) Pain - Right/Left: Left Pain - part of body:  (pelvis)     Time: 6808-8110 PT Time Calculation (min) (ACUTE ONLY): 25 min  Charges:  $Gait Training: 23-37 mins                     Greggory Stallion, PT, DPT, GCS [phone]    Becky Shea 12/20/2021, 10:29 AM

## 2021-12-20 NOTE — Plan of Care (Signed)

## 2021-12-20 NOTE — Progress Notes (Signed)
PROGRESS NOTE Becky Shea  KXF:[id number] DOB: 1931-04-19 DOA: 12/15/2021 PCP: [name], MD   Brief Narrative/Hospital Course: 88 year old female with history of arthritis, bilateral hearing loss, aortic atherosclerosis, neuropathy, who presents emergency department for chief concerns of left-sided hip pain. She reports that approximately 1 week ago 12/08/21 she was in the garden trimming, and she had a wheelbarrow, and she tripped and fell. She was seen at urgent care the following day 11/13 and had an x-ray which showed no hip fracture. FOllowed w/ [facility] had a CT scan 11/18 that showed inferior and superior pubic rami fractures. She reports that initially she was still able to ambulate however it gradually got worse.  Orthopedics was consulted.  Recommended supportive care, conservative management.[name]  PT/OT recommended SNF> at this time she is medically stable for awaiting for placement   Subjective: Overnight vitals afebrile, SBP 120-150s, 95-100% RA.  She is asking to resume her multivitamins and B12.  She has no complaints.  Assessment and Plan: Principal Problem:   Closed fracture of multiple pubic rami, right, initial encounter [facility]) Active Problems:   Primary osteoarthritis of both knees   Primary osteoarthritis of right hip   Status post total hip replacement, right   History of malignant melanoma of skin   Closed left hip fracture, initial encounter [facility])   Essential hypertension   At risk for constipation   Assessment and Plan:  DC summary done 12/18/2021 reviewed  Closed fracture of multiple pubic rami with recent fall at home, seen by orthopedics advised nonoperative management, cont pain control PT OT, awiting skilled nursing facility  Essential hypertension: BP controlled, not on meds-initially high likely from pain.  No prior diagnosis of hypertension  At risk for constipation:Senna docusate 1 tablet nightly resumed Primary osteoarthritis of both  knees: Continue Tylenol pain control B12 multivitamins  Class I Obesity:Patient's Body mass index is 30.04 kg/m. : Will benefit with PCP follow-up, weight loss  healthy lifestyle and outpatient sleep evaluation.   DVT prophylaxis: enoxaparin (LOVENOX) injection 40 mg Start: 12/15/21 2200 Place TED hose Start: 12/15/21 1239 Code Status:   Code Status: Full Code Family Communication: plan of care discussed with patient at bedside. Patient status is: Inpatient because of awaiting placement Level of care: Med-Surg   Dispo: The patient is from: home            Anticipated disposition: SNF once approved Mobility Assessment (last 72 hours)     Mobility Assessment     Row Name 12/20/21 1100 12/20/21 1019 12/19/21 2025 12/19/21 0947 12/19/21 0829   Does patient have an order for bedrest or is patient medically unstable No - Continue assessment No - Continue assessment No - Continue assessment -- No - Continue assessment   What is the highest level of mobility based on the progressive mobility assessment? -- Level 4 (Walks with assist in room) - Balance while marching in place and cannot step forward and back - Complete Level 4 (Walks with assist in room) - Balance while marching in place and cannot step forward and back - Complete Level 4 (Walks with assist in room) - Balance while marching in place and cannot step forward and back - Complete Level 4 (Walks with assist in room) - Balance while marching in place and cannot step forward and back - Complete    Row Name 12/18/21 2120 12/18/21 1600 12/18/21 1200 12/17/21 1327     Does patient have an order for bedrest or is patient medically unstable No -  Continue assessment -- No - Continue assessment --    What is the highest level of mobility based on the progressive mobility assessment? Level 4 (Walks with assist in room) - Balance while marching in place and cannot step forward and back - Complete Level 4 (Walks with assist in room) - Balance while  marching in place and cannot step forward and back - Complete Level 4 (Walks with assist in room) - Balance while marching in place and cannot step forward and back - Complete Level 4 (Walks with assist in room) - Balance while marching in place and cannot step forward and back - Complete              Objective: Vitals last 24 hrs: Vitals:   12/19/21 2036 12/20/21 0045 12/20/21 0413 12/20/21 0839  BP: (!) 155/69 (!) 140/71 121/73 131/75  Pulse: 68 75 70 69  Resp: '16 16 16 16  '$ Temp: 97.7 F (36.5 C) 98.2 F (36.8 C) (!) 97.5 F (36.4 C) 97.7 F (36.5 C)  TempSrc:  Oral    SpO2: 100% 100% 95% 98%  Weight:      Height:       Weight change:   Physical Examination: General exam: alert awake, older than stated age HEENT:Oral mucosa moist, Ear/Nose WNL grossly Respiratory system: bilaterally clear BS, no use of accessory muscle Cardiovascular system: S1 & S2 +, No JVD. Gastrointestinal system: Abdomen soft,NT,ND, BS+ Nervous System:Alert, awake, moving extremities. Extremities: Moving extremities well no edema Skin: No rashes,no icterus. MSK: Normal muscle bulk,tone, power  Medications reviewed:  Scheduled Meds:  cyanocobalamin  1,000 mcg Oral Daily   enoxaparin (LOVENOX) injection  40 mg Subcutaneous Q24H   melatonin  5 mg Oral QHS   multivitamin with minerals  1 tablet Oral Daily   senna-docusate  1 tablet Oral QHS  Continuous Infusions:   Diet Order             Diet - low sodium heart healthy           Diet regular Room service appropriate? Yes; Fluid consistency: Thin  Diet effective now                   Intake/Output Summary (Last 24 hours) at 12/20/2021 1212 Last data filed at 12/20/2021 0654 Gross per 24 hour  Intake --  Output 2050 ml  Net -2050 ml   Net IO Since Admission: -6,080 mL [12/20/21 1212]  Wt Readings from Last 3 Encounters:  12/15/21 79.4 kg  12/09/21 72.6 kg  07/26/21 72.6 kg   Data Reviewed: I have personally reviewed following  labs and imaging studies CBC: Recent Labs  Lab 12/15/21 1200 12/19/21 0551  WBC 8.4 6.7  NEUTROABS 5.2  --   HGB 13.1 12.8  HCT 40.0 37.2  MCV 92.0 89.2  PLT 253 267   Basic Metabolic Panel: Recent Labs  Lab 12/15/21 1200  NA 133*  K 5.0  CL 100  CO2 22  GLUCOSE 98  BUN 17  CREATININE 0.61  CALCIUM 9.6   GFR: Estimated Creatinine Clearance: 49.6 mL/min (by C-G formula based on SCr of 0.61 mg/dL). Liver Function Tests: Recent Labs  Lab 12/15/21 1200  AST 29  ALT 12  ALKPHOS 47  BILITOT 0.7  PROT 7.5  ALBUMIN 3.9   No results found for this or any previous visit (from the past 240 hour(s)).  Antimicrobials: Anti-infectives (From admission, onward)    None      Culture/Microbiology  Component Value Date/Time   SDES  07/05/2018 1452    URINE, CLEAN CATCH Performed at [facility], [address] [place], [place] [address]    [facility]  07/05/2018 1452    NONE Performed at [facility], [address] [place], [place] [address]    CULT  07/05/2018 1452    NO GROWTH Performed at [facility], [place] [address], [place], [place] [address]    REPTSTATUS 07/06/2018 FINAL 07/05/2018 1452    Radiology Studies: No results found.   LOS: 1 day   Antonieta Pert, MD [facility]  12/20/2021, 12:12 PM   clear

## 2021-12-20 NOTE — TOC Progression Note (Addendum)
Transition of Care [facility]) - Progression Note    Patient Details  Name: Becky Shea MRN: [id number] Date of Birth: 06/24/1931  Transition of Care [facility]) CM/SW Contact  Ross Ludwig, [place] Phone Number: 12/20/2021, 3:31 PM  Clinical Narrative:     Insurance Josem Kaufmann is still pending, patient chose [facility].  Reference number [id number].  4:10pm  Patient approved  for 11/22 - 11/27, review due 11/27. Reference ID: [id number]. Plan Auth: [id number].  Contacted Tanya at [facility] she can accept over weekend if medically ready.  Updated bedside nurse and attending physician.        Expected Discharge Plan and Services           Expected Discharge Date: 12/18/21                                     Social Determinants of Health (SDOH) Interventions Housing Interventions: Intervention Not Indicated  Readmission Risk Interventions     No data to display

## 2021-12-21 DIAGNOSIS — M1611 Unilateral primary osteoarthritis, right hip: Secondary | ICD-10-CM | POA: Diagnosis not present

## 2021-12-21 DIAGNOSIS — R2689 Other abnormalities of gait and mobility: Secondary | ICD-10-CM | POA: Diagnosis not present

## 2021-12-21 DIAGNOSIS — Z471 Aftercare following joint replacement surgery: Secondary | ICD-10-CM | POA: Diagnosis not present

## 2021-12-21 DIAGNOSIS — E559 Vitamin D deficiency, unspecified: Secondary | ICD-10-CM | POA: Diagnosis not present

## 2021-12-21 DIAGNOSIS — M170 Bilateral primary osteoarthritis of knee: Secondary | ICD-10-CM | POA: Diagnosis not present

## 2021-12-21 DIAGNOSIS — S32591A Other specified fracture of right pubis, initial encounter for closed fracture: Secondary | ICD-10-CM | POA: Diagnosis not present

## 2021-12-21 DIAGNOSIS — Z79899 Other long term (current) drug therapy: Secondary | ICD-10-CM | POA: Diagnosis not present

## 2021-12-21 DIAGNOSIS — R41841 Cognitive communication deficit: Secondary | ICD-10-CM | POA: Diagnosis not present

## 2021-12-21 DIAGNOSIS — M47816 Spondylosis without myelopathy or radiculopathy, lumbar region: Secondary | ICD-10-CM | POA: Diagnosis not present

## 2021-12-21 DIAGNOSIS — R5381 Other malaise: Secondary | ICD-10-CM | POA: Diagnosis not present

## 2021-12-21 DIAGNOSIS — R262 Difficulty in walking, not elsewhere classified: Secondary | ICD-10-CM | POA: Diagnosis not present

## 2021-12-21 DIAGNOSIS — M6281 Muscle weakness (generalized): Secondary | ICD-10-CM | POA: Diagnosis not present

## 2021-12-21 DIAGNOSIS — S32591D Other specified fracture of right pubis, subsequent encounter for fracture with routine healing: Secondary | ICD-10-CM | POA: Diagnosis not present

## 2021-12-21 DIAGNOSIS — E785 Hyperlipidemia, unspecified: Secondary | ICD-10-CM | POA: Diagnosis not present

## 2021-12-21 DIAGNOSIS — H903 Sensorineural hearing loss, bilateral: Secondary | ICD-10-CM | POA: Diagnosis not present

## 2021-12-21 DIAGNOSIS — L22 Diaper dermatitis: Secondary | ICD-10-CM | POA: Diagnosis not present

## 2021-12-21 DIAGNOSIS — D649 Anemia, unspecified: Secondary | ICD-10-CM | POA: Diagnosis not present

## 2021-12-21 NOTE — TOC Transition Note (Signed)
Transition of Care [facility]) - CM/SW Discharge Note   Patient Details  Name: Becky Shea MRN: [id number] Date of Birth: 14-Jan-1932  Transition [facility]) CM/SW Contact:  Izola Price, RN Phone Number: 12/21/2021, 10:30 AM   Clinical Narrative: 11/25: Updated DC Summary for today's date in-boxed to [facility] and [facility] notified per request via [place]. Daughter to transport to facility. Going to Room #7 at [facility] and report to be called to [phone]. Simmie Davies RN CM    Final next level of care: Skilled Nursing Facility Barriers to Discharge: Barriers Resolved   Patient Goals and CMS Choice        Discharge Placement              Patient chooses bed at: [facility] Patient to be transferred to facility by: Ronney Asters, Daughter, at [phone]. Name of family member notified: Ronney Asters, Daughter, at [phone]. Patient and family notified of of transfer: 12/21/21  Discharge Plan and Services                DME Arranged: N/A DME Agency: NA       HH Arranged: NA HH Agency: NA        Social Determinants of Health (SDOH) Interventions Housing Interventions: Intervention Not Indicated   Readmission Risk Interventions     No data to display

## 2021-12-21 NOTE — Plan of Care (Signed)

## 2021-12-21 NOTE — Discharge Summary (Signed)
Physician Discharge Summary  Becky Shea [place] OKH:[id number] DOB: October 14, 1931 DOA: 12/15/2021  PCP: [name], MD  Admit date: 12/15/2021 Discharge date: 12/21/2021  Admitted From: Home Disposition:  SNF  Discharge Condition:Stable CODE STATUS:FULL Diet recommendation: Heart Healthy  Brief/Interim Summary:    Discharge Diagnoses:  Patient  is a 88 year old female with history of arthritis, bilateral hearing loss, aortic atherosclerosis, neuropathy, who presents emergency department for chief concerns of left-sided hip pain. She reports that approximately 1 week ago 12/08/21 she was in the garden trimming, and she had a wheelbarrow, and she tripped and fell. She was seen at urgent care the following day 11/13 and had an x-ray which showed no hip fracture. FOllowed w/ [facility] had a CT scan 11/18 that showed inferior and superior pubic rami fractures. She reports that initially she was still able to ambulate however it gradually got worse.  Orthopedics was consulted.  Recommended supportive care, conservative management.Becky Shea  PT/OT recommended SNF on discharge.  Can be discharged to SNF today.  She needs to follow-up with orthopedics in 2 weeks.  Following problems were addressed during her hospitalization:  Closed fracture of multiple pubic rami, right, initial encounter [facility] Orthopedics consulted and following.  Recommended conservative management, weightbearing as tolerated, outpatient follow-up.  Pain control. PT/OT recommended SNF on discharge.    Elevated blood pressure without diagnosis of hypertension: No prior diagnosis, presumed secondary to pain Currently blood pressure stable. Likely contributed by pain.No on any meds at this time   At risk for constipation Senna docusate 1 tablet nightly resumed   Primary osteoarthritis of both knees Continued supportive care, pain management    Discharge Instructions  Discharge Instructions     Diet - low sodium heart  healthy   Complete by: As directed    Discharge instructions   Complete by: As directed    1)Please take prescribed medications as instructed 2)Follow up with orthopedics as an outpatient in 2 weeks. Name and number of the provider has been attached   Increase activity slowly   Complete by: As directed    Increase activity slowly   Complete by: As directed       Allergies as of 12/21/2021       Reactions   Poison Ivy Extract Rash   Poison Oak Extract Rash   Poison Sumac Extract Rash        Medication List     STOP taking these medications    ibuprofen 400 MG tablet Commonly known as: ADVIL   traMADol 50 MG tablet Commonly known as: ULTRAM       TAKE these medications    acetaminophen 500 MG tablet Commonly known as: TYLENOL Take 1,000 mg by mouth every 6 (six) hours as needed for moderate pain.   alendronate 70 MG tablet Commonly known as: FOSAMAX TAKE 1 TABLET BY MOUTH EVERY 7 (SEVEN) DAYS WITH A FULL GLASS OF WATER ON AN EMPTY STOMACH.   aspirin EC 81 MG tablet Take 1 tablet (81 mg total) by mouth 2 (two) times daily. Swallow whole.   AZO-CRANBERRY PO Take 2 tablets by mouth daily before breakfast.   cyanocobalamin 1000 MCG tablet Commonly known as: VITAMIN B12 Take 1,000 mcg by mouth daily.   diclofenac Sodium 1 % Gel Commonly known as: VOLTAREN Apply 2 g topically 3 (three) times daily as needed (Joint pain, osteoarthritis pain at the joint).   gabapentin 100 MG capsule Commonly known as: NEURONTIN Take 1 capsule (100 mg total) by mouth at bedtime.  Take 1 capsule (100 mg total) by mouth nightly- Dr Tellhon/ spine center   KRILL OIL PO Take 350 mg by mouth daily.   Magnesium Gluconate 250 MG Tabs Take 1 tablet by mouth daily.   melatonin 3 MG Tabs tablet Take 1 tablet by mouth at bedtime.   Multi-Vitamin tablet Take 1 tablet by mouth daily.   OSTEO BI-FLEX ADV DOUBLE ST PO Take 3 tablets by mouth daily.   oxyCODONE 5 MG immediate  release tablet Commonly known as: Roxicodone Take 1 tablet (5 mg total) by mouth every 6 (six) hours as needed for severe pain.   PRESERVISION AREDS 2+MULTI VIT PO Take by mouth.   senna-docusate 8.6-50 MG tablet Commonly known as: Senokot-S Take 1 tablet by mouth at bedtime. While on opiate pain medication   Tylenol PM Extra Strength 25-500 MG Tabs tablet Generic drug: diphenhydramine-acetaminophen Take 1 tablet by mouth at bedtime as needed.               Durable Medical Equipment  (From admission, onward)           Start     Ordered   12/16/21 1533  For home use only DME Walker rolling  Once       Question Answer Comment  Walker: With [place] Wheels   Patient needs a walker to treat with the following condition Pubic ramus fracture ([place])      12/16/21 1532   12/16/21 1533  For home use only DME Bedside commode  Once       Question:  Patient needs a bedside commode to treat with the following condition  Answer:  Pubic ramus fracture ([place])   12/16/21 1532   12/16/21 1311  For home use only DME Walker rolling  Once       Question Answer Comment  Walker: With [place]   Patient needs a walker to treat with the following condition Pubic ramus fracture ([place])      12/16/21 1310            Follow-up Information     [name], MD Follow up on 01/01/2022.   Specialty: Orthopedic Surgery Why: at 1045 Contact information: [place] [address] [phone]         [name], MD Follow up.   Specialty: Family Medicine Contact information: [address] [place] [address] [phone]                Allergies  Allergen Reactions   Poison Ivy Extract Rash   Poison Oak Extract Rash   Poison Sumac Extract Rash    Consultations: Orthopedics   Procedures/Studies: CT PELVIS WO CONTRAST  Result Date: 12/14/2021 CLINICAL DATA:  Hip pain after fall. EXAM: CT PELVIS WITHOUT CONTRAST TECHNIQUE:  Multidetector CT imaging of the pelvis was performed following the standard protocol without intravenous contrast. RADIATION DOSE REDUCTION: This exam was performed according to the departmental dose-optimization program which includes automated exposure control, adjustment of the mA and/or kV according to patient size and/or use of iterative reconstruction technique. COMPARISON:  Left hip x-ray 12/09/2021. FINDINGS: Urinary Tract:  No abnormality visualized. Bowel:  Unremarkable visualized pelvic bowel loops. Vascular/Lymphatic: There are atherosclerotic calcifications of the aorta and iliac arteries. No enlarged lymph nodes are seen. Reproductive:  The uterus and adnexa are within normal limits. Other:  There is no free fluid or focal abdominal wall hernia. Musculoskeletal: The bones are diffusely osteopenic. There is a  nondisplaced fracture of the medial left superior pubic ramus extending to the pubic symphysis. There is no pubic symphysis diastasis. There is also nondisplaced fracture of the left inferior pubic ramus. There are moderate degenerative changes of the left hip and severe degenerative changes of the visualized lower lumbar spine. Right hip arthroplasty appears uncomplicated. No focal hematoma. IMPRESSION: 1. Nondisplaced fractures of the left superior and inferior pubic rami. 2. Osteopenia. Aortic Atherosclerosis (ICD10-I70.0). Electronically Signed   By: Ronney Asters M.D.   On: 12/14/2021 17:09   DG Hip Unilat With Pelvis 2-3 Views Left  Result Date: 12/09/2021 CLINICAL DATA:  Fall, hip pain EXAM: DG HIP (WITH OR WITHOUT PELVIS) 2-3V LEFT COMPARISON:  07/13/2018 FINDINGS: Diffuse osseous demineralization. Prior right total hip arthroplasty. Left hip joint intact without evidence of fracture or dislocation. Moderate degenerative changes of the left hip. No appreciable soft tissue abnormality. IMPRESSION: 1. No acute osseous abnormality of the left hip. If high clinical suspicion for fracture  remains, a CT or MRI could be considered as subtle nondisplaced fractures can be occult in the setting of bony demineralization. 2. Moderate degenerative changes of the left hip. Electronically Signed   By: Davina Poke D.O.   On: 12/09/2021 12:47      Subjective: Patient seen and examined at bedside.  Sitting comfortably and eating breakfast.  Reports that she is doing fine, denies any new complaints.  Remained afebrile.  No acute events overnight.  Comfortable going to SNF.  Discharge Exam: Vitals:   12/20/21 2336 12/21/21 0854  BP: 111/69 (!) 147/82  Pulse: 70 69  Resp: 17 17  Temp: 98.3 F (36.8 C) 97.7 F (36.5 C)  SpO2: 97% 97%   Vitals:   12/20/21 0839 12/20/21 1758 12/20/21 2336 12/21/21 0854  BP: 131/75 131/61 111/69 (!) 147/82  Pulse: 69 77 70 69  Resp: '16 16 17 17  '$ Temp: 97.7 F (36.5 C) 98.8 F (37.1 C) 98.3 F (36.8 C) 97.7 F (36.5 C)  TempSrc:      SpO2: 98% 96% 97% 97%  Weight:      Height:        General: Pt is alert, awake, not in acute distress,pleasant elderly female,obese, sitting comfortably on the bed and eating breakfast. Cardiovascular: RRR, S1/S2 +, no rubs, no gallops Respiratory: CTA bilaterally, no wheezing, no rhonchi Abdominal: Soft, NT, ND, bowel sounds + Extremities: no edema, no cyanosis    The results of significant diagnostics from this hospitalization (including imaging, microbiology, ancillary and laboratory) are listed below for reference.     Microbiology: No results found for this or any previous visit (from the past 240 hour(s)).   Labs: BNP (last 3 results) No results for input(s): "BNP" in the last 8760 hours. Basic Metabolic Panel: Recent Labs  Lab 12/15/21 1200  NA 133*  K 5.0  CL 100  CO2 22  GLUCOSE 98  BUN 17  CREATININE 0.61  CALCIUM 9.6   Liver Function Tests: Recent Labs  Lab 12/15/21 1200  AST 29  ALT 12  ALKPHOS 47  BILITOT 0.7  PROT 7.5  ALBUMIN 3.9   No results for input(s):  "LIPASE", "AMYLASE" in the last 168 hours. No results for input(s): "AMMONIA" in the last 168 hours. CBC: Recent Labs  Lab 12/15/21 1200 12/19/21 0551  WBC 8.4 6.7  NEUTROABS 5.2  --   HGB 13.1 12.8  HCT 40.0 37.2  MCV 92.0 89.2  PLT 253 313   Cardiac Enzymes: No results for  input(s): "CKTOTAL", "CKMB", "CKMBINDEX", "TROPONINI" in the last 168 hours. BNP: Invalid input(s): "POCBNP" CBG: No results for input(s): "GLUCAP" in the last 168 hours. D-Dimer No results for input(s): "DDIMER" in the last 72 hours. Hgb A1c No results for input(s): "HGBA1C" in the last 72 hours. Lipid Profile No results for input(s): "CHOL", "HDL", "LDLCALC", "TRIG", "CHOLHDL", "LDLDIRECT" in the last 72 hours. Thyroid function studies No results for input(s): "TSH", "T4TOTAL", "T3FREE", "THYROIDAB" in the last 72 hours.  Invalid input(s): "FREET3" Anemia work up No results for input(s): "VITAMINB12", "FOLATE", "FERRITIN", "TIBC", "IRON", "RETICCTPCT" in the last 72 hours. Urinalysis    Component Value Date/Time   COLORURINE YELLOW (A) 07/15/2018 0842   APPEARANCEUR CLEAR (A) 07/15/2018 0842   APPEARANCEUR CLEAR 04/12/2013 1627   LABSPEC 1.013 07/15/2018 0842   LABSPEC 1.010 04/12/2013 1627   PHURINE 7.0 07/15/2018 0842   GLUCOSEU NEGATIVE 07/15/2018 0842   GLUCOSEU NEGATIVE 04/12/2013 1627   HGBUR MODERATE (A) 07/15/2018 0842   BILIRUBINUR negative 08/24/2018 1444   BILIRUBINUR NEGATIVE 04/12/2013 1627   KETONESUR NEGATIVE 07/15/2018 0842   PROTEINUR Negative 08/24/2018 1444   PROTEINUR NEGATIVE 07/15/2018 0842   UROBILINOGEN 0.2 08/24/2018 1444   NITRITE negative 08/24/2018 1444   NITRITE NEGATIVE 07/15/2018 0842   LEUKOCYTESUR Large (3+) (A) 08/24/2018 1444   LEUKOCYTESUR NEGATIVE 07/15/2018 0842   LEUKOCYTESUR 1+ 04/12/2013 1627   Sepsis Labs Recent Labs  Lab 12/15/21 1200 12/19/21 0551  WBC 8.4 6.7   Microbiology No results found for this or any previous visit (from the past  240 hour(s)).  Please note: You were cared for by a hospitalist during your hospital stay. Once you are discharged, your primary care physician will handle any further medical issues. Please note that NO REFILLS for any discharge medications will be authorized once you are discharged, as it is imperative that you return to your primary care physician (or establish a relationship with a primary care physician if you do not have one) for your post hospital discharge needs so that they can reassess your need for medications and monitor your lab values.    Time coordinating discharge: 40 minutes  SIGNED:   Mckinley Jewel, MD  [facility] 12/21/2021, 10:27 AM Pager [phone]  If 7PM-7AM, please contact night-coverage [email or web address] Password [email or web address]

## 2021-12-21 NOTE — TOC Progression Note (Addendum)
Transition of Care [facility]) - Progression Note    Patient Details  Name: Becky Shea MRN: [id number] Date of Birth: 01-25-1932  [facility]) CM/SW Contact  Izola Price, RN Phone Number: 12/21/2021, 10:07 AM  Clinical Narrative: 11/15: Patient is pending discharge today to [facility] STR/SNF facility. Confirmed patient will be admitted to room #7 at [facility] with [facility]. Report to be called to [phone].   Daughter, Ronney Asters, is able to transport patient today except between 12 pm and 330 pm. Her spouse will assist with transport and help with patient as well. AC aware of this time frame, which was okay with AC as long as updated DC Summary in-boxed asap and before noon today.   Therapy notes and DC Summary of 12/19/22 in-boxed via HUB. Updated provider and Unit RN of transport plan, room number, and report number to call.    RN CM requested provider to update DC Summary to today's date per facility request.   RN CM will let Daughter know when patient is ready. Simmie Davies RN CM   Transport Contact: EVANS,URSULA (Daughter)  [phone] (Mobile)        Expected Discharge Plan and Services           Expected Discharge Date: 12/18/21                                     Social Determinants of Health (SDOH) Interventions Housing Interventions: Intervention Not Indicated  Readmission Risk Interventions     No data to display

## 2021-12-23 DIAGNOSIS — R5381 Other malaise: Secondary | ICD-10-CM | POA: Diagnosis not present

## 2021-12-24 DIAGNOSIS — S32591D Other specified fracture of right pubis, subsequent encounter for fracture with routine healing: Secondary | ICD-10-CM | POA: Diagnosis not present

## 2021-12-24 DIAGNOSIS — M6281 Muscle weakness (generalized): Secondary | ICD-10-CM | POA: Diagnosis not present

## 2021-12-24 DIAGNOSIS — M170 Bilateral primary osteoarthritis of knee: Secondary | ICD-10-CM | POA: Diagnosis not present

## 2021-12-24 DIAGNOSIS — M47816 Spondylosis without myelopathy or radiculopathy, lumbar region: Secondary | ICD-10-CM | POA: Diagnosis not present

## 2021-12-24 DIAGNOSIS — L22 Diaper dermatitis: Secondary | ICD-10-CM | POA: Diagnosis not present

## 2021-12-24 DIAGNOSIS — R262 Difficulty in walking, not elsewhere classified: Secondary | ICD-10-CM | POA: Diagnosis not present

## 2021-12-27 DIAGNOSIS — Z471 Aftercare following joint replacement surgery: Secondary | ICD-10-CM | POA: Diagnosis not present

## 2021-12-27 DIAGNOSIS — E559 Vitamin D deficiency, unspecified: Secondary | ICD-10-CM | POA: Diagnosis not present

## 2021-12-27 DIAGNOSIS — Z79899 Other long term (current) drug therapy: Secondary | ICD-10-CM | POA: Diagnosis not present

## 2021-12-27 DIAGNOSIS — E785 Hyperlipidemia, unspecified: Secondary | ICD-10-CM | POA: Diagnosis not present

## 2021-12-27 DIAGNOSIS — D649 Anemia, unspecified: Secondary | ICD-10-CM | POA: Diagnosis not present

## 2022-01-10 DIAGNOSIS — M170 Bilateral primary osteoarthritis of knee: Secondary | ICD-10-CM | POA: Diagnosis not present

## 2022-01-10 DIAGNOSIS — R262 Difficulty in walking, not elsewhere classified: Secondary | ICD-10-CM | POA: Diagnosis not present

## 2022-01-10 DIAGNOSIS — M6281 Muscle weakness (generalized): Secondary | ICD-10-CM | POA: Diagnosis not present

## 2022-01-10 DIAGNOSIS — S32591D Other specified fracture of right pubis, subsequent encounter for fracture with routine healing: Secondary | ICD-10-CM | POA: Diagnosis not present

## 2022-01-14 ENCOUNTER — Telehealth: Payer: Self-pay | Admitting: Family Medicine

## 2022-01-14 NOTE — Telephone Encounter (Signed)
Copied from [place] [id number]. Topic: Quick Communication - Home Health Verbal Orders >> Jan 14, 2022 10:16 AM Marcellus Scott wrote: Caller/Agency: [facility] Number: [phone] secure to leave voice. Requesting OT/PT/Skilled Nursing/Social Work/Speech Therapy: Nursing Frequency: 1w4  Becky Shea stated this is for pelvic rami fracture

## 2022-01-14 NOTE — Telephone Encounter (Signed)
Called [facility] left VM giving verbal orders.  KP

## 2022-01-15 DIAGNOSIS — S32592A Other specified fracture of left pubis, initial encounter for closed fracture: Secondary | ICD-10-CM | POA: Diagnosis not present

## 2022-01-16 ENCOUNTER — Telehealth: Payer: Self-pay | Admitting: Family Medicine

## 2022-01-16 NOTE — Telephone Encounter (Unsigned)
Home Health Verbal Orders - Caller/Agency: [facility]  Callback Number: [phone] vm can be left  Requesting PT Frequency: 2x's a week for 3 weeks and 1x a week for 5 weeks

## 2022-01-17 NOTE — Telephone Encounter (Signed)
[facility] gave him verbal orders. He verbalized understanding.  KP

## 2022-01-23 ENCOUNTER — Telehealth: Payer: Self-pay | Admitting: Family Medicine

## 2022-01-23 ENCOUNTER — Telehealth: Payer: Self-pay

## 2022-01-23 NOTE — Telephone Encounter (Signed)
Spoke to [employer] @ [phone]- gave the okay to proceed with OT/ PT

## 2022-01-23 NOTE — Telephone Encounter (Signed)
Copied from [place] [id number]. Topic: Quick Communication - Home Health Verbal Orders >> Jan 23, 2022  9:26 AM Everette C wrote: Caller/Agency: [facility] Number: [phone] Requesting OT/PT/Skilled Nursing/Social Work/Speech Therapy: OT Frequency: 1w3

## 2022-01-30 ENCOUNTER — Other Ambulatory Visit: Payer: Self-pay | Admitting: Family Medicine

## 2022-01-30 ENCOUNTER — Ambulatory Visit: Payer: Medicare HMO | Admitting: Family Medicine

## 2022-01-30 DIAGNOSIS — M810 Age-related osteoporosis without current pathological fracture: Secondary | ICD-10-CM

## 2022-01-30 NOTE — Telephone Encounter (Signed)
Requested medications are due for refill today.  yes  Requested medications are on the active medications list.  yes  Last refill. 07/26/2021 #12 1 rf  Future visit scheduled.   yes  Notes to clinic.  Missing labs.    Requested Prescriptions  Pending Prescriptions Disp Refills   alendronate (FOSAMAX) 70 MG tablet [Pharmacy Med Name: ALENDRONATE SODIUM 70 MG TAB] 12 tablet 1    Sig: TAKE 1 TABLET BY MOUTH EVERY 7 (SEVEN) DAYS WITH A FULL GLASS OF WATER ON AN EMPTY STOMACH.     Endocrinology:  Bisphosphonates Failed - 01/30/2022  1:41 AM      Failed - Vitamin D in normal range and within 360 days    No results found for: "TO6712WP8", "KD9833AS5", "[id number]", "25OHVITD3", "25OHVITD2", "25OHVITD1", "VD25OH"       Failed - Mg Level in normal range and within 360 days    Magnesium  Date Value Ref Range Status  07/23/2011 1.7 (L) mg/dL Final    Comment:    1.8-2.4 THERAPEUTIC RANGE: 4-7 mg/dL TOXIC: > 10 mg/dL  -----------------------          Failed - Phosphate in normal range and within 360 days    Phosphorus  Date Value Ref Range Status  07/24/2020 3.7 3.0 - 4.3 mg/dL Final         Failed - Bone Mineral Density or Dexa Scan completed in the last 2 years      Passed - Ca in normal range and within 360 days    Calcium  Date Value Ref Range Status  12/15/2021 9.6 8.9 - 10.3 mg/dL Final   Calcium, Total  Date Value Ref Range Status  04/12/2013 8.5 8.5 - 10.1 mg/dL Final         Passed - Cr in normal range and within 360 days    Creatinine  Date Value Ref Range Status  04/12/2013 0.86 0.60 - 1.30 mg/dL Final   Creatinine, Ser  Date Value Ref Range Status  12/15/2021 0.61 0.44 - 1.00 mg/dL Final         Passed - eGFR is 30 or above and within 360 days    EGFR (African American)  Date Value Ref Range Status  04/12/2013 >60  Final   GFR calc Af Amer  Date Value Ref Range Status  07/16/2018 >60 >60 mL/min Final   EGFR (Non-African Amer.)  Date Value Ref  Range Status  04/12/2013 >60  Final    Comment:    eGFR values <9m/min/1.73 m2 may be an indication of chronic kidney disease (CKD). Calculated eGFR is useful in patients with stable renal function. The eGFR calculation will not be reliable in acutely ill patients when serum creatinine is changing rapidly. It is not useful in  patients on dialysis. The eGFR calculation may not be applicable to patients at the low and high extremes of body sizes, pregnant women, and vegetarians.    GFR, Estimated  Date Value Ref Range Status  12/15/2021 >60 >60 mL/min Final    Comment:    (NOTE) Calculated using the CKD-EPI Creatinine Equation (2021)    eGFR  Date Value Ref Range Status  07/26/2021 85 >59 mL/min/1.73 Final         Passed - Valid encounter within last 12 months    Recent Outpatient Visits           6 months ago Age-related osteoporosis without current pathological fracture   [facility]  [name], MD   1 year ago Age-related osteoporosis without current pathological fracture   [place] [facility] at [place], Deanna C, MD   1 year ago Age-related osteoporosis without current pathological fracture   [place] [facility] at [place], Deanna C, MD   1 year ago Age-related osteoporosis without current pathological fracture   [place] [facility] at [place], Deanna C, MD   2 years ago Parotitis, acute   [place] [facility], MD       Future Appointments             Tomorrow [name], MD [facility] at [facility]

## 2022-01-31 ENCOUNTER — Ambulatory Visit (INDEPENDENT_AMBULATORY_CARE_PROVIDER_SITE_OTHER): Payer: Medicare HMO | Admitting: Family Medicine

## 2022-01-31 ENCOUNTER — Encounter: Payer: Self-pay | Admitting: Family Medicine

## 2022-01-31 VITALS — BP 138/82 | HR 68 | Ht 64.0 in | Wt 159.0 lb

## 2022-01-31 DIAGNOSIS — S72002S Fracture of unspecified part of neck of left femur, sequela: Secondary | ICD-10-CM | POA: Diagnosis not present

## 2022-01-31 NOTE — Progress Notes (Signed)
Date:  01/31/2022   Name:  Becky Shea [facility]   DOB:  February 24, 1931   MRN:  [id number]   Chief Complaint: [place] [employer] on 11/13 while trimming bushes outside- had a closed fracture of pelvis- admitted to hospital on 11/19 and discharged to SNF. Was discharged approx. 2 weeks ago on 01/09/22. Signing orders for OT/PT and nurse)  Fall The accident occurred More than 1 week ago (november). Fall occurred: trimmimg bush. She landed on [place]. There was no blood loss. Point of impact: walker. The pain is present in the left hip. The pain is mild (nondisplaced). Pertinent negatives include no abdominal pain, fever, headaches, hematuria or nausea. Treatments tried: ot/pt home health. The treatment provided moderate relief.    Lab Results  Component Value Date   NA 133 (L) 12/15/2021   K 5.0 12/15/2021   CO2 22 12/15/2021   GLUCOSE 98 12/15/2021   BUN 17 12/15/2021   CREATININE 0.61 12/15/2021   CALCIUM 9.6 12/15/2021   EGFR 85 07/26/2021   GFRNONAA >60 12/15/2021   Lab Results  Component Value Date   CHOL 247 (H) 07/26/2021   HDL 50 07/26/2021   LDLCALC 169 (H) 07/26/2021   TRIG 151 (H) 07/26/2021   Lab Results  Component Value Date   TSH 1.990 07/26/2021   Lab Results  Component Value Date   HGBA1C 5.7 07/23/2011   Lab Results  Component Value Date   WBC 6.7 12/19/2021   HGB 12.8 12/19/2021   HCT 37.2 12/19/2021   MCV 89.2 12/19/2021   PLT 313 12/19/2021   Lab Results  Component Value Date   ALT 12 12/15/2021   AST 29 12/15/2021   ALKPHOS 47 12/15/2021   BILITOT 0.7 12/15/2021   No results found for: "25OHVITD2", "25OHVITD3", "VD25OH"   Review of Systems  Constitutional: Negative.  Negative for chills, fatigue, fever and unexpected weight change.  HENT:  Negative for ear discharge and trouble swallowing.   Respiratory:  Negative for chest tightness and shortness of breath.   Cardiovascular:  Negative for palpitations.  Gastrointestinal:  Negative for abdominal pain  and nausea.  Genitourinary:  Negative for dysuria, frequency and hematuria.  Musculoskeletal:  Negative for myalgias.  Skin:  Negative for rash.  Neurological:  Negative for dizziness, weakness and headaches.  Hematological:  Negative for adenopathy. Does not bruise/bleed easily.  Psychiatric/Behavioral:  Negative for dysphoric mood. The patient is not nervous/anxious.     Patient Active Problem List   Diagnosis Date Noted   Closed fracture of multiple pubic rami, right, initial encounter ([place]) 12/15/2021   Closed left hip fracture, initial encounter ([place]) 12/15/2021   Essential hypertension 12/15/2021   At risk for constipation 12/15/2021   Rotator cuff tendinitis, right 12/27/2018   History of malignant melanoma of skin 10/22/2018   Status post total hip replacement, right 07/13/2018   Sebaceous cyst 04/09/2018   Lumbar spondylosis 03/17/2018   Primary osteoarthritis of right hip 03/17/2018   Primary osteoarthritis of both knees 12/27/2014    Allergies  Allergen Reactions   Poison Ivy Extract Rash   Poison Oak Extract Rash   Poison Sumac Extract Rash    Past Surgical History:  Procedure Laterality Date   CATARACT EXTRACTION, BILATERAL     NO PAST SURGERIES     TOTAL HIP ARTHROPLASTY Right 07/13/2018   Procedure: TOTAL HIP ARTHROPLASTY;  Surgeon: Corky Mull, MD;  Location: [facility] ORS;  Service: Orthopedics;  Laterality: Right;    Social History  Tobacco Use   Smoking status: Never   Smokeless tobacco: Never  Vaping Use   Vaping Use: Never used  Substance Use Topics   Alcohol use: Not Currently    Comment: occasional   Drug use: No     Medication list has been reviewed and updated.  Current Meds  Medication Sig   acetaminophen (TYLENOL) 500 MG tablet Take 1,000 mg by mouth every 6 (six) hours as needed for moderate pain.    alendronate (FOSAMAX) 70 MG tablet TAKE 1 TABLET BY MOUTH EVERY 7 (SEVEN) DAYS WITH A FULL GLASS OF WATER ON AN EMPTY STOMACH.    aspirin EC 81 MG tablet Take 1 tablet (81 mg total) by mouth 2 (two) times daily. Swallow whole.   AZO-CRANBERRY PO Take 2 tablets by mouth daily before breakfast.   diclofenac Sodium (VOLTAREN) 1 % GEL Apply 2 g topically 3 (three) times daily as needed (Joint pain, osteoarthritis pain at the joint).   diphenhydramine-acetaminophen (TYLENOL PM EXTRA STRENGTH) 25-500 MG TABS tablet Take 1 tablet by mouth at bedtime as needed.   gabapentin (NEURONTIN) 100 MG capsule Take 1 capsule (100 mg total) by mouth at bedtime. Take 1 capsule (100 mg total) by mouth nightly- Dr Tellhon/ spine center   KRILL OIL PO Take 350 mg by mouth daily.   Magnesium Gluconate 250 MG TABS Take 1 tablet by mouth daily.   Melatonin 3 MG TABS Take 1 tablet by mouth at bedtime.   Misc Natural Products (OSTEO BI-FLEX ADV DOUBLE ST PO) Take 3 tablets by mouth daily.   Multiple Vitamin (MULTI-VITAMIN) tablet Take 1 tablet by mouth daily.   Multiple Vitamins-Minerals (PRESERVISION AREDS 2+MULTI VIT PO) Take by mouth.   senna-docusate (SENOKOT-S) 8.6-50 MG tablet Take 1 tablet by mouth at bedtime. While on opiate pain medication   vitamin B-12 (CYANOCOBALAMIN) 1000 MCG tablet Take 1,000 mcg by mouth daily.       01/31/2022    9:07 AM 07/26/2021    9:25 AM 07/24/2020    1:38 PM  GAD 7 : Generalized Anxiety Score  Nervous, Anxious, on Edge 0 0 0  Control/stop worrying 0 0 0  Worry too much - different things 0 0 0  Trouble relaxing 0 0 0  Restless 0 0 0  Easily annoyed or irritable 0 0 0  Afraid - awful might happen 0 0 0  Total GAD 7 Score 0 0 0  Anxiety Difficulty Not difficult at all Not difficult at all        01/31/2022    9:07 AM 07/26/2021    9:26 AM 07/26/2021    9:25 AM  Depression screen PHQ 2/9  Decreased Interest 0 0   Down, Depressed, Hopeless 0 0   PHQ - 2 Score 0 0   Altered sleeping 0 0   Tired, decreased energy 0 2   Change in appetite 0 0 0  Feeling bad or failure about yourself  0 0 0  Trouble  concentrating 0 0 0  Moving slowly or fidgety/restless 0 0 0  Suicidal thoughts 0 0 0  PHQ-9 Score 0 2   Difficult doing work/chores Not difficult at all Not difficult at all     BP Readings from Last 3 Encounters:  01/31/22 138/82  12/21/21 (!) 147/82  12/09/21 (!) 171/80    Physical Exam Vitals and nursing note reviewed.  HENT:     Head: Normocephalic.     Right Ear: Tympanic membrane normal.  Left Ear: Tympanic membrane normal.     Mouth/Throat:     Mouth: Mucous membranes are moist.  Cardiovascular:     Heart sounds: No murmur heard.    No friction rub. No gallop.  Pulmonary:     Breath sounds: No wheezing or rhonchi.  Musculoskeletal:        General: No tenderness, deformity or signs of injury.     Cervical back: Neck supple.     Wt Readings from Last 3 Encounters:  01/31/22 159 lb (72.1 kg)  12/15/21 175 lb (79.4 kg)  12/09/21 160 lb 0.9 oz (72.6 kg)    BP 138/82 (BP Location: Right Arm, Cuff Size: Large)   Pulse 68   Ht '5\' 4"'$  (1.626 m)   Wt 159 lb (72.1 kg)   SpO2 95%   BMI 27.29 kg/m   Assessment and Plan:  1. Closed fracture of left hip, sequela New onset.  Currently stable and progressing well with therapy.  Status post fracture of pubis ramus followed by Dr. Thera Flake.  Patient is currently attended to by home health with occupational therapy and physical therapy.  Patient is to be reevaluated today and we have agreed that physical therapy would be beneficial and if they would continue this that we would be more than happy to accommodate with a signature.   Otilio Miu, MD

## 2022-02-05 ENCOUNTER — Encounter: Payer: Self-pay | Admitting: Family Medicine

## 2022-02-12 DIAGNOSIS — S32592A Other specified fracture of left pubis, initial encounter for closed fracture: Secondary | ICD-10-CM | POA: Diagnosis not present

## 2022-02-21 DIAGNOSIS — D225 Melanocytic nevi of trunk: Secondary | ICD-10-CM | POA: Diagnosis not present

## 2022-02-21 DIAGNOSIS — Z85820 Personal history of malignant melanoma of skin: Secondary | ICD-10-CM | POA: Diagnosis not present

## 2022-02-21 DIAGNOSIS — L578 Other skin changes due to chronic exposure to nonionizing radiation: Secondary | ICD-10-CM | POA: Diagnosis not present

## 2022-04-18 DIAGNOSIS — M549 Dorsalgia, unspecified: Secondary | ICD-10-CM | POA: Diagnosis not present

## 2022-04-18 DIAGNOSIS — M7918 Myalgia, other site: Secondary | ICD-10-CM | POA: Diagnosis not present

## 2022-04-23 ENCOUNTER — Other Ambulatory Visit: Payer: Self-pay | Admitting: Family Medicine

## 2022-04-23 DIAGNOSIS — M810 Age-related osteoporosis without current pathological fracture: Secondary | ICD-10-CM

## 2022-05-14 ENCOUNTER — Other Ambulatory Visit: Payer: Self-pay | Admitting: Family Medicine

## 2022-05-14 DIAGNOSIS — M810 Age-related osteoporosis without current pathological fracture: Secondary | ICD-10-CM

## 2022-05-15 NOTE — Telephone Encounter (Signed)
Requested medication (s) are due for refill today: yes  Requested medication (s) are on the active medication list: yes  Last refill:  04/23/22 #4/0  Future visit scheduled: no  Notes to clinic: pharmacy requesting 90 DS. Unable to refill per protocol due to failed labs, no updated results.      Requested Prescriptions  Pending Prescriptions Disp Refills   alendronate (FOSAMAX) 70 MG tablet [Pharmacy Med Name: ALENDRONATE SODIUM 70 MG TAB] 12 tablet 1    Sig: TAKE 1 TABLET BY MOUTH EVERY 7 (SEVEN) DAYS WITH A FULL GLASS OF WATER ON AN EMPTY STOMACH.     Endocrinology:  Bisphosphonates Failed - 05/14/2022  1:31 PM      Failed - Vitamin D in normal range and within 360 days    No results found for: "ZO1096EA5", "WU9811BJ4", "[id number]", "25OHVITD3", "25OHVITD2", "25OHVITD1", "VD25OH"       Failed - Mg Level in normal range and within 360 days    Magnesium  Date Value Ref Range Status  07/23/2011 1.7 (L) mg/dL Final    Comment:    6.5-7.8 THERAPEUTIC RANGE: 4-7 mg/dL TOXIC: > 10 mg/dL  -----------------------          Failed - Phosphate in normal range and within 360 days    Phosphorus  Date Value Ref Range Status  07/24/2020 3.7 3.0 - 4.3 mg/dL Final         Failed - Bone Mineral Density or Dexa Scan completed in the last 2 years      Passed - Ca in normal range and within 360 days    Calcium  Date Value Ref Range Status  12/15/2021 9.6 8.9 - 10.3 mg/dL Final   Calcium, Total  Date Value Ref Range Status  04/12/2013 8.5 8.5 - 10.1 mg/dL Final         Passed - Cr in normal range and within 360 days    Creatinine  Date Value Ref Range Status  04/12/2013 0.86 0.60 - 1.30 mg/dL Final   Creatinine, Ser  Date Value Ref Range Status  12/15/2021 0.61 0.44 - 1.00 mg/dL Final         Passed - eGFR is 30 or above and within 360 days    EGFR (African American)  Date Value Ref Range Status  04/12/2013 >60  Final   GFR calc Af Amer  Date Value Ref Range Status   07/16/2018 >60 >60 mL/min Final   EGFR (Non-African Amer.)  Date Value Ref Range Status  04/12/2013 >60  Final    Comment:    eGFR values <46mL/min/1.73 m2 may be an indication of chronic kidney disease (CKD). Calculated eGFR is useful in patients with stable renal function. The eGFR calculation will not be reliable in acutely ill patients when serum creatinine is changing rapidly. It is not useful in  patients on dialysis. The eGFR calculation may not be applicable to patients at the low and high extremes of body sizes, pregnant women, and vegetarians.    GFR, Estimated  Date Value Ref Range Status  12/15/2021 >60 >60 mL/min Final    Comment:    (NOTE) Calculated using the CKD-EPI Creatinine Equation (2021)    eGFR  Date Value Ref Range Status  07/26/2021 85 >59 mL/min/1.73 Final         Passed - Valid encounter within last 12 months    Recent Outpatient Visits           3 months ago Closed fracture of left hip, sequela  [facility]   9 months ago Age-related osteoporosis without current pathological fracture   [place] [facility]   1 year ago Age-related osteoporosis without current pathological fracture   [place] [facility]   1 year ago Age-related osteoporosis without current pathological fracture   [place] [facility]   2 years ago Age-related osteoporosis without current pathological fracture   [facility], [facility]

## 2022-05-26 ENCOUNTER — Ambulatory Visit (INDEPENDENT_AMBULATORY_CARE_PROVIDER_SITE_OTHER): Payer: Medicare HMO

## 2022-05-26 ENCOUNTER — Encounter: Payer: Self-pay | Admitting: Family Medicine

## 2022-05-26 VITALS — Ht 64.0 in

## 2022-05-26 DIAGNOSIS — Z0000 Encounter for general adult medical examination without abnormal findings: Secondary | ICD-10-CM

## 2022-05-26 NOTE — Progress Notes (Addendum)
Subjective:   Becky Shea is a 89 y.o. female who presents for Medicare Annual (Subsequent) preventive examination.  Review of Systems    Pt seen in office       Objective:    There were no vitals filed for this visit. There is no height or weight on file to calculate BMI.     05/26/2022    2:55 PM 12/15/2021    4:00 PM 05/20/2021    2:23 PM 05/07/2020   11:33 AM 05/04/2019    9:40 AM 07/13/2018   11:07 AM 07/13/2018    6:48 AM  Advanced Directives  Does Patient Have a Medical Advance Directive? Yes Yes Yes Yes Yes Yes Yes  Type of Estate agent of [place];Living will Living will;Healthcare Power of [employer] Power of [place];Living will Healthcare Power of [place];Living will Healthcare Power of [place];Living will Healthcare Power of [place];Living will Healthcare Power of Attorney  Does patient want to make changes to medical advance directive?  No - Patient declined    No - Patient declined No - Patient declined  Copy of Healthcare Power of Attorney in Chart? Yes - validated most recent copy scanned in chart (See row information)  No - copy requested No - copy requested No - copy requested No - copy requested No - copy requested    Current Medications (verified) Outpatient Encounter Medications as of 05/26/2022  Medication Sig   acetaminophen (TYLENOL) 500 MG tablet Take 500 mg by mouth every 8 (eight) hours as needed for moderate pain.   alendronate (FOSAMAX) 70 MG tablet TAKE 1 TABLET BY MOUTH EVERY 7 (SEVEN) DAYS WITH A FULL GLASS OF WATER ON AN EMPTY STOMACH.   AZO-CRANBERRY PO Take 2 tablets by mouth daily before breakfast.   Collagen-Boron-Hyaluronic Acid ([employer] JOINT HEALTH TRIPLE ACTION PO) Take by mouth daily.   diclofenac Sodium (VOLTAREN) 1 % GEL Apply 2 g topically 3 (three) times daily as needed (Joint pain, osteoarthritis pain at the joint).   diphenhydramine-acetaminophen (TYLENOL PM EXTRA STRENGTH) 25-500 MG TABS tablet  Take 1 tablet by mouth at bedtime as needed.   gabapentin (NEURONTIN) 100 MG capsule Take 1 capsule (100 mg total) by mouth at bedtime. Take 1 capsule (100 mg total) by mouth nightly- Dr Tellhon/ spine center   KRILL OIL PO Take 350 mg by mouth daily.   Magnesium Gluconate 250 MG TABS Take 1 tablet by mouth daily.   Melatonin 3 MG TABS Take 1 tablet by mouth at bedtime.   Multiple Vitamins-Minerals (PRESERVISION AREDS 2+MULTI VIT PO) Take by mouth 2 (two) times daily with a meal.   vitamin B-12 (CYANOCOBALAMIN) 1000 MCG tablet Take 1,000 mcg by mouth daily.   [DISCONTINUED] aspirin EC 81 MG tablet Take 1 tablet (81 mg total) by mouth 2 (two) times daily. Swallow whole.   [DISCONTINUED] traMADol (ULTRAM) 50 MG tablet Take 1 tablet every 6 hours by oral route as needed for 5 days.   Multiple Vitamin (MULTI-VITAMIN) tablet Take 1 tablet by mouth daily.   [DISCONTINUED] Misc Natural Products (OSTEO BI-FLEX ADV DOUBLE ST PO) Take 3 tablets by mouth daily.   [DISCONTINUED] oxyCODONE (ROXICODONE) 5 MG immediate release tablet Take 1 tablet (5 mg total) by mouth every 6 (six) hours as needed for severe pain. (Patient not taking: Reported on 01/31/2022)   [DISCONTINUED] senna-docusate (SENOKOT-S) 8.6-50 MG tablet Take 1 tablet by mouth at bedtime. While on opiate pain medication   No facility-administered encounter medications on file as  of 05/26/2022.    Allergies (verified) Poison ivy extract, Poison oak extract, and Poison sumac extract   History: Past Medical History:  Diagnosis Date   Arthritis    GERD (gastroesophageal reflux disease)    rare but no meds   HOH (hard of hearing)    wears bilateral hearing aides   Past Surgical History:  Procedure Laterality Date   CATARACT EXTRACTION, BILATERAL     NO PAST SURGERIES     TOTAL HIP ARTHROPLASTY Right 07/13/2018   Procedure: TOTAL HIP ARTHROPLASTY;  Surgeon: Christena Flake, MD;  Location: [facility] ORS;  Service: Orthopedics;  Laterality: Right;    History reviewed. No pertinent family history. Social History   Socioeconomic History   Marital status: Widowed    Spouse name: Not on file   Number of children: Not on file   Years of education: Not on file   Highest education level: Not on file  Occupational History   Occupation: Merchandiser, retail at [employer]    Comment: retired  Tobacco Use   Smoking status: Never   Smokeless tobacco: Never  Vaping Use   Vaping Use: Never used  Substance and Sexual Activity   Alcohol use: Not Currently    Comment: occasional   Drug use: No   Sexual activity: Never  Other Topics Concern   Not on file  Social History Narrative   Pt lives alone but daughter Marny Lowenstein comes by daily   Social Determinants of Health   Financial Resource Strain: Low Risk  (05/26/2022)   Overall Financial Resource Strain (CARDIA)    Difficulty of Paying Living Expenses: Not hard at all  Food Insecurity: No Food Insecurity (05/26/2022)   Hunger Vital Sign    Worried About Running Out of Food in the Last Year: Never true    Ran Out of Food in the Last Year: Never true  Transportation Needs: No Transportation Needs (05/26/2022)   PRAPARE - Administrator, Civil Service (Medical): No    Lack of Transportation (Non-Medical): No  Physical Activity: Insufficiently Active (05/20/2021)   Exercise Vital Sign    Days of Exercise per Week: 2 days    Minutes of Exercise per Session: 60 min  Stress: No Stress Concern Present (05/20/2021)   [employer] of Occupational Health - Occupational Stress Questionnaire    Feeling of Stress : Not at all  Social Connections: Socially Isolated (05/20/2021)   Social Connection and Isolation Panel [NHANES]    Frequency of Communication with Friends and Family: More than three times a week    Frequency of Social Gatherings with Friends and Family: More than three times a week    Attends Religious Services: Never    Database administrator or Organizations: No    Attends Tax inspector Meetings: Never    Marital Status: Widowed    Tobacco Counseling Counseling given: Not Answered   Clinical Intake:  Pre-visit preparation completed: Yes  Pain : No/denies pain     Diabetes: No     Diabetic?no  Interpreter Needed?: No      Activities of Daily Living    05/26/2022    2:55 PM 12/15/2021    4:00 PM  In your present state of health, do you have any difficulty performing the following activities:  Hearing? 1 1  Vision? 0 0  Difficulty concentrating or making decisions? 0 0  Walking or climbing stairs? 0 0  Dressing or bathing? 0 0  Doing errands, shopping? 0 1  Preparing Food and eating ? N   Using the Toilet? N   In the past six months, have you accidently leaked urine? N   Do you have problems with loss of bowel control? N   Managing your Medications? N   Managing your Finances? N   Housekeeping or managing your Housekeeping? N     Patient Care Team: Duanne Limerick, MD as PCP - General (Family Medicine)  Indicate any recent Medical Services you may have received from other than [facility] providers in the past year (date may be approximate).     Assessment:   This is a routine wellness examination for Becky Shea.  Hearing/Vision screen Hearing Screening - Comments:: Hard of hearing    Dietary issues and exercise activities discussed: Current Exercise Habits: Home exercise routine, Time (Minutes): 15, Frequency (Times/Week): 7, Weekly Exercise (Minutes/Week): 105, Intensity: Mild   Goals Addressed   None   Depression Screen    05/26/2022    2:55 PM 01/31/2022    9:07 AM 07/26/2021    9:26 AM 05/20/2021    2:23 PM 07/24/2020    1:37 PM 05/07/2020   11:32 AM 02/09/2020    1:30 PM  PHQ 2/9 Scores  PHQ - 2 Score 0 0 0 0 0 0 0  PHQ- 9 Score  0 2  0  0    Fall Risk    05/26/2022    2:55 PM 01/31/2022    9:06 AM 07/26/2021    9:26 AM 05/20/2021    2:25 PM 05/07/2020   11:35 AM  Fall Risk   Falls in the past year? 1 1 0 0 0  Number  falls in past yr: 0 0 0 0 0  Injury with Fall? 1 1 0 0 0  Risk for fall due to : History of fall(s) Impaired balance/gait;History of fall(s) Impaired balance/gait No Fall Risks Impaired mobility;Impaired balance/gait;No Fall Risks  Follow up Falls evaluation completed Falls evaluation completed;Falls prevention discussed Falls evaluation completed Falls prevention discussed Falls prevention discussed    FALL RISK PREVENTION PERTAINING TO THE HOME:  Any stairs in or around the home? No  If so, are there any without handrails? No  Home free of loose throw rugs in walkways, pet beds, electrical cords, etc? Yes  Adequate lighting in your home to reduce risk of falls? Yes   ASSISTIVE DEVICES UTILIZED TO PREVENT FALLS:  Life alert? No  Use of a cane, walker or w/c? Yes  Grab bars in the bathroom? Yes  Shower chair or bench in shower? Yes  Elevated toilet seat or a handicapped toilet? No   TIMED UP AND GO:  Was the test performed? Yes .  Length of time to ambulate 10 feet: n/a sec.     Cognitive Function:        05/26/2022    2:57 PM 05/07/2020   11:40 AM  6CIT Screen  What Year? 0 points 0 points  What month? 0 points 0 points  What time? 0 points 0 points  Count back from 20 0 points 0 points  Months in reverse 0 points 0 points  Repeat phrase 0 points 2 points  Total Score 0 points 2 points    Immunizations Immunization History  Administered Date(s) Administered   Influenza, High Dose Seasonal PF 11/29/2017, 12/16/2020, 11/16/2021   Influenza-Unspecified 10/23/2018, 11/18/2019   PFIZER(Purple Top)SARS-COV-2 Vaccination 03/21/2019, 04/12/2019, 12/14/2019   Pneumococcal Conjugate-13 10/23/2018   Pneumococcal Polysaccharide-23 04/06/2015   Zoster Recombinat (Shingrix) 01/05/2021, 06/01/2021  TDAP status: Due, Education has been provided regarding the importance of this vaccine. Advised may receive this vaccine at local pharmacy or Health Dept. Aware to provide a copy  of the vaccination record if obtained from local pharmacy or Health Dept. Verbalized acceptance and understanding.  Flu Vaccine status: Up to date  Pneumococcal vaccine status: Up to date  Covid-19 vaccine status: Completed vaccines  Qualifies for Shingles Vaccine? Yes   Zostavax completed No   Shingrix Completed?: Yes  Screening Tests Health Maintenance  Topic Date Due   DTaP/Tdap/Td (1 - Tdap) Never done   COVID-19 Vaccine (4 - 2023-24 season) 09/27/2021   INFLUENZA VACCINE  08/28/2022   Medicare Annual Wellness (AWV)  05/26/2023   Pneumonia Vaccine 8+ Years old  Completed   DEXA SCAN  Completed   Zoster Vaccines- Shingrix  Completed   HPV VACCINES  Aged Out    Health Maintenance  Health Maintenance Due  Topic Date Due   DTaP/Tdap/Td (1 - Tdap) Never done   COVID-19 Vaccine (4 - 2023-24 season) 09/27/2021       Bone Density status: Completed 06/13/2019. Results reflect: Bone density results: OSTEOPOROSIS. Repeat every 2-3 years.  Lung Cancer Screening: (Low Dose CT Chest recommended if Age 58-80 years, 30 pack-year currently smoking OR have quit w/in 15years.) does not qualify.   Lung Cancer Screening Referral: no  Additional Screening:  Hepatitis C Screening: does not qualify; Completed n/a  Vision Screening: Recommended annual ophthalmology exams for early detection of glaucoma and other disorders of the eye. Is the patient up to date with their annual eye exam?  Yes  Who is the provider or what is the name of the office in which the patient attends annual eye exams?  If pt is not established with a provider, would they like to be referred to a provider to establish care? No .   Dental Screening: Recommended annual dental exams for proper oral hygiene  Community Resource Referral / Chronic Care Management: CRR required this visit?  Yes   CCM required this visit?  Yes      Plan:     I have personally reviewed and noted the following in the patient's  chart:   Medical and social history Use of alcohol, tobacco or illicit drugs  Current medications and supplements including opioid prescriptions. Patient is not currently taking opioid prescriptions. Functional ability and status Nutritional status Physical activity Advanced directives List of other physicians Hospitalizations, surgeries, and ER visits in previous 12 months Vitals Screenings to include cognitive, depression, and falls Referrals and appointments  In addition, I have reviewed and discussed with patient certain preventive protocols, quality metrics, and best practice recommendations. A written personalized care plan for preventive services as well as general preventive health recommendations were provided to patient.     Eulis Canner Toryn Mcclinton, CMA   05/26/2022   Nurse Notes: none

## 2022-06-11 DIAGNOSIS — H5203 Hypermetropia, bilateral: Secondary | ICD-10-CM | POA: Diagnosis not present

## 2022-08-06 ENCOUNTER — Other Ambulatory Visit: Payer: Self-pay | Admitting: Family Medicine

## 2022-08-06 DIAGNOSIS — M810 Age-related osteoporosis without current pathological fracture: Secondary | ICD-10-CM

## 2022-08-06 NOTE — Telephone Encounter (Signed)
Requested Prescriptions  Pending Prescriptions Disp Refills   alendronate (FOSAMAX) 70 MG tablet [Pharmacy Med Name: ALENDRONATE SODIUM 70 MG TAB] 12 tablet 0    Sig: TAKE 1 TABLET BY MOUTH EVERY 7 (SEVEN) DAYS WITH A FULL GLASS OF WATER ON AN EMPTY STOMACH.     Endocrinology:  Bisphosphonates Failed - 08/06/2022  2:45 AM      Failed - Vitamin D in normal range and within 360 days    No results found for: "DG6440HK7", "QQ5956LO7", "FI433IR5JOA", "25OHVITD3", "25OHVITD2", "25OHVITD1", "VD25OH"       Failed - Mg Level in normal range and within 360 days    Magnesium  Date Value Ref Range Status  07/23/2011 1.7 (L) mg/dL Final    Comment:    4.1-6.6 THERAPEUTIC RANGE: 4-7 mg/dL TOXIC: > 10 mg/dL  -----------------------          Failed - Phosphate in normal range and within 360 days    Phosphorus  Date Value Ref Range Status  07/24/2020 3.7 3.0 - 4.3 mg/dL Final         Failed - Bone Mineral Density or Dexa Scan completed in the last 2 years      Passed - Ca in normal range and within 360 days    Calcium  Date Value Ref Range Status  12/15/2021 9.6 8.9 - 10.3 mg/dL Final   Calcium, Total  Date Value Ref Range Status  04/12/2013 8.5 8.5 - 10.1 mg/dL Final         Passed - Cr in normal range and within 360 days    Creatinine  Date Value Ref Range Status  04/12/2013 0.86 0.60 - 1.30 mg/dL Final   Creatinine, Ser  Date Value Ref Range Status  12/15/2021 0.61 0.44 - 1.00 mg/dL Final         Passed - eGFR is 30 or above and within 360 days    EGFR (African American)  Date Value Ref Range Status  04/12/2013 >60  Final   GFR calc Af Amer  Date Value Ref Range Status  07/16/2018 >60 >60 mL/min Final   EGFR (Non-African Amer.)  Date Value Ref Range Status  04/12/2013 >60  Final    Comment:    eGFR values <60mL/min/1.73 m2 may be an indication of chronic kidney disease (CKD). Calculated eGFR is useful in patients with stable renal function. The eGFR calculation will  not be reliable in acutely ill patients when serum creatinine is changing rapidly. It is not useful in  patients on dialysis. The eGFR calculation may not be applicable to patients at the low and high extremes of body sizes, pregnant women, and vegetarians.    GFR, Estimated  Date Value Ref Range Status  12/15/2021 >60 >60 mL/min Final    Comment:    (NOTE) Calculated using the CKD-EPI Creatinine Equation (2021)    eGFR  Date Value Ref Range Status  07/26/2021 85 >59 mL/min/1.73 Final         Passed - Valid encounter within last 12 months    Recent Outpatient Visits           6 months ago Closed fracture of left hip, sequela   [place] [facility]   1 year ago Age-related osteoporosis without current pathological fracture   [place] [facility]   1 year ago Age-related osteoporosis without current pathological fracture  [facility]   2 years ago Age-related osteoporosis without current pathological fracture   [place] [facility]   2 years ago Age-related osteoporosis without current pathological fracture   [facility]

## 2022-08-22 DIAGNOSIS — Z85820 Personal history of malignant melanoma of skin: Secondary | ICD-10-CM | POA: Diagnosis not present

## 2022-08-22 DIAGNOSIS — D2271 Melanocytic nevi of right lower limb, including hip: Secondary | ICD-10-CM | POA: Diagnosis not present

## 2022-08-22 DIAGNOSIS — R238 Other skin changes: Secondary | ICD-10-CM | POA: Diagnosis not present

## 2022-08-22 DIAGNOSIS — D2261 Melanocytic nevi of right upper limb, including shoulder: Secondary | ICD-10-CM | POA: Diagnosis not present

## 2022-08-22 DIAGNOSIS — B078 Other viral warts: Secondary | ICD-10-CM | POA: Diagnosis not present

## 2022-08-22 DIAGNOSIS — D2262 Melanocytic nevi of left upper limb, including shoulder: Secondary | ICD-10-CM | POA: Diagnosis not present

## 2022-08-22 DIAGNOSIS — D225 Melanocytic nevi of trunk: Secondary | ICD-10-CM | POA: Diagnosis not present

## 2022-10-23 ENCOUNTER — Other Ambulatory Visit: Payer: Self-pay | Admitting: Family Medicine

## 2022-10-23 DIAGNOSIS — M810 Age-related osteoporosis without current pathological fracture: Secondary | ICD-10-CM

## 2022-10-23 NOTE — Telephone Encounter (Signed)
Requested medication (s) are due for refill today:yes  Requested medication (s) are on the active medication list: yes  Last refill:  08/06/22  Future visit scheduled:no  Notes to clinic:  missing lab work   Requested Prescriptions  Pending Prescriptions Disp Refills   alendronate (FOSAMAX) 70 MG tablet [Pharmacy Med Name: ALENDRONATE SODIUM 70 MG TAB] 12 tablet 0    Sig: TAKE 1 TABLET BY MOUTH EVERY 7 (SEVEN) DAYS WITH A FULL GLASS OF WATER ON AN EMPTY STOMACH.     Endocrinology:  Bisphosphonates Failed - 10/23/2022  1:40 AM      Failed - Vitamin D in normal range and within 360 days    No results found for: "NF6213YQ6", "VH8469GE9", "[id number]", "25OHVITD3", "25OHVITD2", "25OHVITD1", "VD25OH"       Failed - Mg Level in normal range and within 360 days    Magnesium  Date Value Ref Range Status  07/23/2011 1.7 (L) mg/dL Final    Comment:    0.2-7.2 THERAPEUTIC RANGE: 4-7 mg/dL TOXIC: > 10 mg/dL  -----------------------          Failed - Phosphate in normal range and within 360 days    Phosphorus  Date Value Ref Range Status  07/24/2020 3.7 3.0 - 4.3 mg/dL Final         Failed - Bone Mineral Density or Dexa Scan completed in the last 2 years      Passed - Ca in normal range and within 360 days    Calcium  Date Value Ref Range Status  12/15/2021 9.6 8.9 - 10.3 mg/dL Final   Calcium, Total  Date Value Ref Range Status  04/12/2013 8.5 8.5 - 10.1 mg/dL Final         Passed - Cr in normal range and within 360 days    Creatinine  Date Value Ref Range Status  04/12/2013 0.86 0.60 - 1.30 mg/dL Final   Creatinine, Ser  Date Value Ref Range Status  12/15/2021 0.61 0.44 - 1.00 mg/dL Final         Passed - eGFR is 30 or above and within 360 days    EGFR (African American)  Date Value Ref Range Status  04/12/2013 >60  Final   GFR calc Af Amer  Date Value Ref Range Status  07/16/2018 >60 >60 mL/min Final   EGFR (Non-African Amer.)  Date Value Ref Range Status   04/12/2013 >60  Final    Comment:    eGFR values <8mL/min/1.73 m2 may be an indication of chronic kidney disease (CKD). Calculated eGFR is useful in patients with stable renal function. The eGFR calculation will not be reliable in acutely ill patients when serum creatinine is changing rapidly. It is not useful in  patients on dialysis. The eGFR calculation may not be applicable to patients at the low and high extremes of body sizes, pregnant women, and vegetarians.    GFR, Estimated  Date Value Ref Range Status  12/15/2021 >60 >60 mL/min Final    Comment:    (NOTE) Calculated using the CKD-EPI Creatinine Equation (2021)    eGFR  Date Value Ref Range Status  07/26/2021 85 >59 mL/min/1.73 Final         Passed - Valid encounter within last 12 months    Recent Outpatient Visits           8 months ago Closed fracture of left hip, sequela   [place] [facility]  1 year ago Age-related osteoporosis without current pathological fracture   [place] [facility]   1 year ago Age-related osteoporosis without current pathological fracture   [place] [facility]   2 years ago Age-related osteoporosis without current pathological fracture   [place] [facility]   2 years ago Age-related osteoporosis without current pathological fracture   [facility]

## 2022-11-26 ENCOUNTER — Other Ambulatory Visit: Payer: Self-pay | Admitting: Family Medicine

## 2022-11-26 DIAGNOSIS — M810 Age-related osteoporosis without current pathological fracture: Secondary | ICD-10-CM

## 2022-11-27 NOTE — Telephone Encounter (Signed)
Requested Prescriptions  Pending Prescriptions Disp Refills   alendronate (FOSAMAX) 70 MG tablet [Pharmacy Med Name: ALENDRONATE SODIUM 70 MG TAB] 12 tablet 0    Sig: TAKE 1 TABLET BY MOUTH EVERY 7 (SEVEN) DAYS WITH A FULL GLASS OF WATER ON AN EMPTY STOMACH.     Endocrinology:  Bisphosphonates Failed - 11/26/2022  7:23 PM      Failed - Vitamin D in normal range and within 360 days    No results found for: "FA2130QM5", "HQ4696EX5", "[id number]", "25OHVITD3", "25OHVITD2", "25OHVITD1", "VD25OH"       Failed - Mg Level in normal range and within 360 days    Magnesium  Date Value Ref Range Status  07/23/2011 1.7 (L) mg/dL Final    Comment:    2.7-2.5 THERAPEUTIC RANGE: 4-7 mg/dL TOXIC: > 10 mg/dL  -----------------------          Failed - Phosphate in normal range and within 360 days    Phosphorus  Date Value Ref Range Status  07/24/2020 3.7 3.0 - 4.3 mg/dL Final         Failed - Bone Mineral Density or Dexa Scan completed in the last 2 years      Passed - Ca in normal range and within 360 days    Calcium  Date Value Ref Range Status  12/15/2021 9.6 8.9 - 10.3 mg/dL Final   Calcium, Total  Date Value Ref Range Status  04/12/2013 8.5 8.5 - 10.1 mg/dL Final         Passed - Cr in normal range and within 360 days    Creatinine  Date Value Ref Range Status  04/12/2013 0.86 0.60 - 1.30 mg/dL Final   Creatinine, Ser  Date Value Ref Range Status  12/15/2021 0.61 0.44 - 1.00 mg/dL Final         Passed - eGFR is 30 or above and within 360 days    EGFR (African American)  Date Value Ref Range Status  04/12/2013 >60  Final   GFR calc Af Amer  Date Value Ref Range Status  07/16/2018 >60 >60 mL/min Final   EGFR (Non-African Amer.)  Date Value Ref Range Status  04/12/2013 >60  Final    Comment:    eGFR values <81mL/min/1.73 m2 may be an indication of chronic kidney disease (CKD). Calculated eGFR is useful in patients with stable renal function. The eGFR calculation  will not be reliable in acutely ill patients when serum creatinine is changing rapidly. It is not useful in  patients on dialysis. The eGFR calculation may not be applicable to patients at the low and high extremes of body sizes, pregnant women, and vegetarians.    GFR, Estimated  Date Value Ref Range Status  12/15/2021 >60 >60 mL/min Final    Comment:    (NOTE) Calculated using the CKD-EPI Creatinine Equation (2021)    eGFR  Date Value Ref Range Status  07/26/2021 85 >59 mL/min/1.73 Final         Passed - Valid encounter within last 12 months    Recent Outpatient Visits           10 months ago Closed fracture of left hip, sequela   [place] [facility]   1 year ago Age-related osteoporosis without current pathological fracture   [place] [facility]   1 year ago Age-related osteoporosis without current pathological fracture  [facility]   2 years ago Age-related osteoporosis without current pathological fracture   [place] [facility]   2 years ago Age-related osteoporosis without current pathological fracture   [facility]

## 2023-02-14 ENCOUNTER — Other Ambulatory Visit: Payer: Self-pay | Admitting: Family Medicine

## 2023-02-14 DIAGNOSIS — M810 Age-related osteoporosis without current pathological fracture: Secondary | ICD-10-CM

## 2023-05-07 ENCOUNTER — Other Ambulatory Visit: Payer: Self-pay | Admitting: Family Medicine

## 2023-05-07 DIAGNOSIS — M810 Age-related osteoporosis without current pathological fracture: Secondary | ICD-10-CM

## 2023-08-05 ENCOUNTER — Telehealth: Payer: Self-pay

## 2023-08-05 ENCOUNTER — Telehealth: Payer: Self-pay | Admitting: Student

## 2023-08-05 DIAGNOSIS — M810 Age-related osteoporosis without current pathological fracture: Secondary | ICD-10-CM

## 2023-08-05 NOTE — Telephone Encounter (Signed)
 Noted  KP  Copied from CRM [id number]. Topic: Appointments - Scheduling Inquiry for Clinic >> Aug 05, 2023  2:31 PM Fonda T wrote: Reason for CRM: Patient has been scheduled an appointment for transfer of care from Dr. Cathryne Molt, to Dr. Lemon, same office and location.  Reason for transfer of care: Provider, Dr. Cathryne Molt no longer at office.   Appointment is scheduled for 08/28/2023.  Appointment details given to patient.   Sending CRM to clinic per protocol for transfer of care.   Thank you

## 2023-08-05 NOTE — Telephone Encounter (Signed)
 Copied from CRM [id number]. Topic: Clinical - Medication Refill >> Aug 05, 2023  2:28 PM Fonda T wrote: Medication: alendronate  (FOSAMAX ) 70 MG tablet  Has the patient contacted their pharmacy? Yes, multiple requests made to office. [facility] provider, Dr. Cathryne Molt, no longer with office (Agent: If no, request that the patient contact the pharmacy for the refill. If patient does not wish to contact the pharmacy document the reason why and proceed with request.) (Agent: If yes, when and what did the pharmacy advise?)  This is the patient's preferred pharmacy:  [facility] [phone] [address], [place] - [address] [place] [address] Phone: [phone] Fax: [phone]  Is this the correct pharmacy for this prescription? Yes If no, delete pharmacy and type the correct one.   Has the prescription been filled recently? Yes  Is the patient out of the medication? No  Has the patient been seen for an appointment in the last year OR does the patient have an upcoming appointment? Yes  Can we respond through MyChart? No, prefer phone call  Agent: Please be advised that Rx refills may take up to 3 business days. We ask that you follow-up with your pharmacy.

## 2023-08-07 NOTE — Telephone Encounter (Signed)
 Requested medications are due for refill today.  yes  Requested medications are on the active medications list.  yes  Last refill. 05/07/2023 #12 0 rf  Future visit scheduled.   yes  Notes to clinic.  Dr. Joshua Pt    Requested Prescriptions  Pending Prescriptions Disp Refills   alendronate  (FOSAMAX ) 70 MG tablet 12 tablet 0    Sig: TAKE 1 TABLET BY MOUTH EVERY 7 (SEVEN) DAYS WITH A FULL GLASS OF WATER ON AN EMPTY STOMACH.     Endocrinology:  Bisphosphonates Failed - 08/07/2023 12:16 PM      Failed - Ca in normal range and within 360 days    Calcium  Date Value Ref Range Status  12/15/2021 9.6 8.9 - 10.3 mg/dL Final   Calcium, Total  Date Value Ref Range Status  04/12/2013 8.5 8.5 - 10.1 mg/dL Final         Failed - Vitamin D in normal range and within 360 days    No results found for: CI7874NY7, CI6874NY7, CI874NY7UNU, 25OHVITD3, 25OHVITD2, 25OHVITD1, VD25OH       Failed - Cr in normal range and within 360 days    Creatinine  Date Value Ref Range Status  04/12/2013 0.86 0.60 - 1.30 mg/dL Final   Creatinine, Ser  Date Value Ref Range Status  12/15/2021 0.61 0.44 - 1.00 mg/dL Final         Failed - Mg Level in normal range and within 360 days    Magnesium   Date Value Ref Range Status  07/23/2011 1.7 (L) mg/dL Final    Comment:    8.1-7.5 THERAPEUTIC RANGE: 4-7 mg/dL TOXIC: > 10 mg/dL  -----------------------          Failed - Phosphate in normal range and within 360 days    Phosphorus  Date Value Ref Range Status  07/24/2020 3.7 3.0 - 4.3 mg/dL Final         Failed - eGFR is 30 or above and within 360 days    EGFR (African American)  Date Value Ref Range Status  04/12/2013 >60  Final   GFR calc Af Amer  Date Value Ref Range Status  07/16/2018 >60 >60 mL/min Final   EGFR (Non-African Amer.)  Date Value Ref Range Status  04/12/2013 >60  Final    Comment:    eGFR values <3mL/min/1.73 m2 may be an indication of chronic kidney disease  (CKD). Calculated eGFR is useful in patients with stable renal function. The eGFR calculation will not be reliable in acutely ill patients when serum creatinine is changing rapidly. It is not useful in  patients on dialysis. The eGFR calculation may not be applicable to patients at the low and high extremes of body sizes, pregnant women, and vegetarians.    GFR, Estimated  Date Value Ref Range Status  12/15/2021 >60 >60 mL/min Final    Comment:    (NOTE) Calculated using the CKD-EPI Creatinine Equation (2021)    eGFR  Date Value Ref Range Status  07/26/2021 85 >59 mL/min/1.73 Final         Failed - Valid encounter within last 12 months    Recent Outpatient Visits   None            Failed - Bone Mineral Density or Dexa Scan completed in the last 2 years

## 2023-08-07 NOTE — Telephone Encounter (Signed)
Please review medication refill  request

## 2023-08-12 ENCOUNTER — Other Ambulatory Visit: Payer: Self-pay | Admitting: Family Medicine

## 2023-08-12 DIAGNOSIS — M810 Age-related osteoporosis without current pathological fracture: Secondary | ICD-10-CM

## 2023-08-12 NOTE — Telephone Encounter (Signed)
 Requested medication (s) are due for refill today: yes  Requested medication (s) are on the active medication list: yes  Last refill:  05/07/23 #12/0  Future visit scheduled: yes with new provider.  Notes to clinic:  pt out of medication and requesting refill. Has NP with new provider since Jones left. Please advise and FU on refill.      Requested Prescriptions  Pending Prescriptions Disp Refills   alendronate  (FOSAMAX ) 70 MG tablet 12 tablet 0    Sig: TAKE 1 TABLET BY MOUTH EVERY 7 (SEVEN) DAYS WITH A FULL GLASS OF WATER ON AN EMPTY STOMACH.     Endocrinology:  Bisphosphonates Failed - 08/12/2023  3:25 PM      Failed - Ca in normal range and within 360 days    Calcium  Date Value Ref Range Status  12/15/2021 9.6 8.9 - 10.3 mg/dL Final   Calcium, Total  Date Value Ref Range Status  04/12/2013 8.5 8.5 - 10.1 mg/dL Final         Failed - Vitamin D in normal range and within 360 days    No results found for: [id number], CI6874NY7, CI874NY7UNU, 25OHVITD3, 25OHVITD2, 25OHVITD1, VD25OH       Failed - Cr in normal range and within 360 days    Creatinine  Date Value Ref Range Status  04/12/2013 0.86 0.60 - 1.30 mg/dL Final   Creatinine, Ser  Date Value Ref Range Status  12/15/2021 0.61 0.44 - 1.00 mg/dL Final         Failed - Mg Level in normal range and within 360 days    Magnesium   Date Value Ref Range Status  07/23/2011 1.7 (L) mg/dL Final    Comment:    8.1-7.5 THERAPEUTIC RANGE: 4-7 mg/dL TOXIC: > 10 mg/dL  -----------------------          Failed - Phosphate in normal range and within 360 days    Phosphorus  Date Value Ref Range Status  07/24/2020 3.7 3.0 - 4.3 mg/dL Final         Failed - eGFR is 30 or above and within 360 days    EGFR (African American)  Date Value Ref Range Status  04/12/2013 >60  Final   GFR calc Af Amer  Date Value Ref Range Status  07/16/2018 >60 >60 mL/min Final   EGFR (Non-African Amer.)  Date Value Ref Range  Status  04/12/2013 >60  Final    Comment:    eGFR values <22mL/min/1.73 m2 may be an indication of chronic kidney disease (CKD). Calculated eGFR is useful in patients with stable renal function. The eGFR calculation will not be reliable in acutely ill patients when serum creatinine is changing rapidly. It is not useful in  patients on dialysis. The eGFR calculation may not be applicable to patients at the low and high extremes of body sizes, pregnant women, and vegetarians.    GFR, Estimated  Date Value Ref Range Status  12/15/2021 >60 >60 mL/min Final    Comment:    (NOTE) Calculated using the CKD-EPI Creatinine Equation (2021)    eGFR  Date Value Ref Range Status  07/26/2021 85 >59 mL/min/1.73 Final         Failed - Valid encounter within last 12 months    Recent Outpatient Visits   None            Failed - Bone Mineral Density or Dexa Scan completed in the last 2 years

## 2023-08-14 ENCOUNTER — Telehealth: Payer: Self-pay

## 2023-08-14 ENCOUNTER — Other Ambulatory Visit: Payer: Self-pay

## 2023-08-14 DIAGNOSIS — M810 Age-related osteoporosis without current pathological fracture: Secondary | ICD-10-CM

## 2023-08-14 MED ORDER — ALENDRONATE SODIUM 70 MG PO TABS: ORAL_TABLET | ORAL | 0 refills | Status: DC

## 2023-08-14 NOTE — Telephone Encounter (Signed)
 Called pts daughter let her know a refill has been sent in. She verbalized understanding.  KP  Copied from CRM [id number]. Topic: Clinical - Prescription Issue >> Aug 12, 2023  3:26 PM Essie A wrote: Reason for CRM: Office will follow up with patient for refill of  alendronate  (FOSAMAX ) 70 MG tablet. >> Aug 14, 2023 10:29 AM Montie POUR wrote: Daughter is calling back about the above medication. Pharmacy has not received the order. Chart show Dr. Lemon signed order on 08/05/23. She needs medication by Sunday because she in going on a trip for 2 weeks and she out of this medication. Please call daughter when this is completed. Her number is [phone].  I am sending this high priority because daughter has been waiting on this since 08/05/23 and mom is leaving for 2 weeks.

## 2023-08-14 NOTE — Telephone Encounter (Signed)
 Copied from CRM [id number]. Topic: Clinical - Prescription Issue >> Aug 12, 2023  3:26 PM Essie A wrote: Reason for CRM: Office will follow up with patient for refill of  alendronate  (FOSAMAX ) 70 MG tablet. >> Aug 14, 2023 10:29 AM Montie POUR wrote: Daughter is calling back about the above medication. Pharmacy has not received the order. Chart show Dr. Lemon signed order on 08/05/23. She needs medication by Sunday because she in going on a trip for 2 weeks and she out of this medication. Please call daughter when this is completed. Her number is [phone].  I am sending this high priority because daughter has been waiting on this since 08/05/23 and mom is leaving for 2 weeks.

## 2023-08-28 ENCOUNTER — Encounter: Admitting: Student

## 2023-08-31 ENCOUNTER — Ambulatory Visit (INDEPENDENT_AMBULATORY_CARE_PROVIDER_SITE_OTHER): Admitting: Student

## 2023-08-31 ENCOUNTER — Encounter: Payer: Self-pay | Admitting: Student

## 2023-08-31 VITALS — BP 122/70 | HR 75 | Ht 64.0 in | Wt 145.0 lb

## 2023-08-31 DIAGNOSIS — E785 Hyperlipidemia, unspecified: Secondary | ICD-10-CM | POA: Insufficient documentation

## 2023-08-31 DIAGNOSIS — Z96641 Presence of right artificial hip joint: Secondary | ICD-10-CM | POA: Diagnosis not present

## 2023-08-31 DIAGNOSIS — I10 Essential (primary) hypertension: Secondary | ICD-10-CM

## 2023-08-31 DIAGNOSIS — M810 Age-related osteoporosis without current pathological fracture: Secondary | ICD-10-CM | POA: Insufficient documentation

## 2023-08-31 DIAGNOSIS — M47816 Spondylosis without myelopathy or radiculopathy, lumbar region: Secondary | ICD-10-CM

## 2023-08-31 DIAGNOSIS — M170 Bilateral primary osteoarthritis of knee: Secondary | ICD-10-CM

## 2023-08-31 NOTE — Assessment & Plan Note (Signed)
 Sees PM&R at [facility] currently on gabapentin  100 mg daily and tylenol  between 1,500mg  to 2000mg  daily. Discussed avoiding tylenol  PM to reduce fall risk.

## 2023-08-31 NOTE — Assessment & Plan Note (Signed)
 Doing well with tylenol , not function limiting.

## 2023-08-31 NOTE — Assessment & Plan Note (Signed)
 Lipid Panel     Component Value Date/Time   CHOL 247 (H) 07/26/2021 0954   CHOL 140 07/23/2011 0421   TRIG 151 (H) 07/26/2021 0954   TRIG 56 07/23/2011 0421   HDL 50 07/26/2021 0954   HDL 40 07/23/2011 0421   VLDL 11 07/23/2011 0421   LDLCALC 169 (H) 07/26/2021 0954   LDLCALC 89 07/23/2011 0421   LABVLDL 28 07/26/2021 0954  Currently takes krill oil. Lipid panel today.

## 2023-08-31 NOTE — Progress Notes (Signed)
 Established Patient Office Visit  Subjective   Patient ID: Becky Shea, female    DOB: [birth year]  Age: 89 y.o. MRN: [id number]  Chief Complaint  Patient presents with   Transitions Of Care    Patient is here today to transition from Dr. Joshua to new PCP    Becky Shea presents to day with daughter for transfer of care from Dr. Joshua who recently retired. Las seen in 203. Has been doing well. No acute complaints. She lives by herself at home, daughter sets up pills packs once a week and does groceries, also has pre prepared meal subscription. She is independent of ADLs. Uses walker consistently. Water aerobics classes twice a week. Denies recent falls. Considering moving to live with daughter in [place] to have more company. Please refer to problem based charting for further details and assessment and plan of current problem and chronic medical conditions.   Patient Active Problem List   Diagnosis Date Noted   Osteoporosis 08/31/2023   HLD (hyperlipidemia) 08/31/2023   Closed fracture of multiple pubic rami, right, initial encounter (HCC) 12/15/2021   Essential hypertension 12/15/2021   At risk for constipation 12/15/2021   Other specified fracture of left pubis, initial encounter for closed fracture (HCC) 12/14/2021   Rotator cuff tendinitis, right 12/27/2018   History of malignant melanoma of skin 10/22/2018   Status post total hip replacement, right 07/13/2018   Lumbar spondylosis 03/17/2018   Primary osteoarthritis of both knees 12/27/2014      ROS Refer to HPI    Objective:     BP 122/70   Pulse 75   Ht 5' 4 (1.626 m)   Wt 145 lb (65.8 kg)   SpO2 97%   BMI 24.89 kg/m  BP Readings from Last 3 Encounters:  08/31/23 122/70  01/31/22 138/82  12/21/21 (!) 147/82    Physical Exam Constitutional:      General: She is not in acute distress.    Appearance: Normal appearance.     Comments: Using rolling walker  HENT:     Mouth/Throat:     Mouth:  Mucous membranes are moist.     Pharynx: Oropharynx is clear.  Cardiovascular:     Rate and Rhythm: Normal rate and regular rhythm.  Pulmonary:     Effort: Pulmonary effort is normal.     Breath sounds: No rhonchi or rales.  Abdominal:     General: Abdomen is flat. Bowel sounds are normal. There is no distension.     Palpations: Abdomen is soft.     Tenderness: There is no abdominal tenderness.  Musculoskeletal:        General: Normal range of motion.     Right lower leg: No edema.     Left lower leg: No edema.  Skin:    General: Skin is warm and dry.     Capillary Refill: Capillary refill takes less than 2 seconds.  Neurological:     General: No focal deficit present.     Mental Status: She is alert and oriented to person, place, and time.  Psychiatric:        Mood and Affect: Mood normal.        Behavior: Behavior normal.        08/31/2023   10:53 AM 05/26/2022    2:55 PM 01/31/2022    9:07 AM  Depression screen PHQ 2/9  Decreased Interest 0 0 0  Down, Depressed, Hopeless 0 0 0  PHQ - 2 Score  0 0 0  Altered sleeping 0  0  Tired, decreased energy 0  0  Change in appetite 0  0  Feeling bad or failure about yourself  0  0  Trouble concentrating 0  0  Moving slowly or fidgety/restless 0  0  Suicidal thoughts 0  0  PHQ-9 Score 0  0  Difficult doing work/chores Not difficult at all  Not difficult at all       08/31/2023   10:53 AM 01/31/2022    9:07 AM 07/26/2021    9:25 AM 07/24/2020    1:38 PM  GAD 7 : Generalized Anxiety Score  Nervous, Anxious, on Edge 0 0 0 0  Control/stop worrying 0 0 0 0  Worry too much - different things 0 0 0 0  Trouble relaxing 0 0 0 0  Restless 0 0 0 0  Easily annoyed or irritable 0 0 0 0  Afraid - awful might happen 0 0 0 0  Total GAD 7 Score 0 0 0 0  Anxiety Difficulty Not difficult at all Not difficult at all Not difficult at all     No results found for any visits on 08/31/23.  Last CBC Lab Results  Component Value Date   WBC 6.7  12/19/2021   HGB 12.8 12/19/2021   HCT 37.2 12/19/2021   MCV 89.2 12/19/2021   MCH 30.7 12/19/2021   RDW 12.8 12/19/2021   PLT 313 12/19/2021   Last metabolic panel Lab Results  Component Value Date   GLUCOSE 98 12/15/2021   NA 133 (L) 12/15/2021   K 5.0 12/15/2021   CL 100 12/15/2021   CO2 22 12/15/2021   BUN 17 12/15/2021   CREATININE 0.61 12/15/2021   GFRNONAA >60 12/15/2021   CALCIUM 9.6 12/15/2021   PHOS 3.7 07/24/2020   PROT 7.5 12/15/2021   ALBUMIN 3.9 12/15/2021   LABGLOB 2.7 07/26/2021   AGRATIO 1.8 07/26/2021   BILITOT 0.7 12/15/2021   ALKPHOS 47 12/15/2021   AST 29 12/15/2021   ALT 12 12/15/2021   ANIONGAP 11 12/15/2021   Last lipids Lab Results  Component Value Date   CHOL 247 (H) 07/26/2021   HDL 50 07/26/2021   LDLCALC 169 (H) 07/26/2021   TRIG 151 (H) 07/26/2021   Last hemoglobin A1c Lab Results  Component Value Date   HGBA1C 5.7 07/23/2011      The ASCVD Risk score (Arnett DK, et al., 2019) failed to calculate for the following reasons:   The 2019 ASCVD risk score is only valid for ages 81 to 60    Assessment & Plan:  Status post total hip replacement, right Assessment & Plan: Doing well with walker, not having significant hip pain. Takes tylenol  as needed. Gabapentin   [facility] at [facility]  center   Hyperlipidemia, unspecified hyperlipidemia type Assessment & Plan: Lipid Panel     Component Value Date/Time   CHOL 247 (H) 07/26/2021 0954   CHOL 140 07/23/2011 0421   TRIG 151 (H) 07/26/2021 0954   TRIG 56 07/23/2011 0421   HDL 50 07/26/2021 0954   HDL 40 07/23/2011 0421   VLDL 11 07/23/2011 0421   LDLCALC 169 (H) 07/26/2021 0954   LDLCALC 89 07/23/2011 0421   LABVLDL 28 07/26/2021 0954  Currently takes krill oil. Lipid panel today.    Orders: -     Lipid panel  Age-related osteoporosis without current pathological fracture Assessment & Plan: Has been on alendronate  since 2021. BMD with osteoporosis. No jaw pain or  reflux symptoms.  Reviewed administration instructions. Daughter will reinforce instructions at home. CMP and vitamin D  today. Repeat BMD next year.    Orders: -     Comprehensive metabolic panel with GFR -     VITAMIN D  25 Hydroxy (Vit-D Deficiency, Fractures)  Essential hypertension Assessment & Plan: Normotensive today not on medication for this.    Lumbar spondylosis Assessment & Plan: Sees PM&[facility] at [facility] currently on gabapentin  100 mg daily and tylenol  between 1,500mg  to 2000mg  daily. Discussed avoiding tylenol  PM to reduce fall risk.    Primary osteoarthritis of both knees Assessment & Plan: Doing well with tylenol , not function limiting.       Return in about 6 months (around 03/02/2024) for physical.    Harlene Saddler, MD

## 2023-08-31 NOTE — Assessment & Plan Note (Addendum)
 Has been on alendronate  since 2021. BMD with osteoporosis. No jaw pain or reflux symptoms. Reviewed administration instructions. Daughter will reinforce instructions at home. CMP and vitamin D  today. Repeat BMD next year.

## 2023-08-31 NOTE — Assessment & Plan Note (Addendum)
 Doing well with walker, not having significant hip pain. Takes tylenol  as needed. Gabapentin   Spine at [facility]  center

## 2023-08-31 NOTE — Assessment & Plan Note (Signed)
 Normotensive today not on medication for this.

## 2023-09-01 ENCOUNTER — Ambulatory Visit: Payer: Self-pay | Admitting: Student

## 2023-09-01 DIAGNOSIS — R748 Abnormal levels of other serum enzymes: Secondary | ICD-10-CM

## 2023-09-01 LAB — COMPREHENSIVE METABOLIC PANEL WITH GFR
ALT: 71 IU/L — ABNORMAL HIGH (ref 0–32)
AST: 77 IU/L — ABNORMAL HIGH (ref 0–40)
Albumin: 4 g/dL (ref 3.6–4.6)
Alkaline Phosphatase: 360 IU/L — ABNORMAL HIGH (ref 44–121)
BUN/Creatinine Ratio: 23 (ref 12–28)
BUN: 14 mg/dL (ref 10–36)
Bilirubin Total: 0.6 mg/dL (ref 0.0–1.2)
CO2: 25 mmol/L (ref 20–29)
Calcium: 9.7 mg/dL (ref 8.7–10.3)
Chloride: 95 mmol/L — ABNORMAL LOW (ref 96–106)
Creatinine, Ser: 0.61 mg/dL (ref 0.57–1.00)
Globulin, Total: 3.2 g/dL (ref 1.5–4.5)
Glucose: 99 mg/dL (ref 70–99)
Potassium: 5.3 mmol/L — ABNORMAL HIGH (ref 3.5–5.2)
Sodium: 132 mmol/L — ABNORMAL LOW (ref 134–144)
Total Protein: 7.2 g/dL (ref 6.0–8.5)
eGFR: 85 mL/min/1.73 (ref >59)

## 2023-09-01 LAB — LIPID PANEL
Chol/HDL Ratio: 3.1 ratio (ref 0.0–4.4)
Cholesterol, Total: 198 mg/dL (ref 100–199)
HDL: 63 mg/dL (ref >39)
LDL Chol Calc (NIH): 117 mg/dL — ABNORMAL HIGH (ref 0–99)
Triglycerides: 104 mg/dL (ref 0–149)
VLDL Cholesterol Cal: 18 mg/dL (ref 5–40)

## 2023-09-01 LAB — VITAMIN D 25 HYDROXY (VIT D DEFICIENCY, FRACTURES): Vit D, 25-Hydroxy: 31.5 ng/mL (ref 30.0–100.0)

## 2023-09-08 ENCOUNTER — Other Ambulatory Visit: Payer: Self-pay | Admitting: Student

## 2023-09-08 DIAGNOSIS — M810 Age-related osteoporosis without current pathological fracture: Secondary | ICD-10-CM

## 2023-09-10 NOTE — Telephone Encounter (Signed)
 Please review medication refill request

## 2023-09-10 NOTE — Telephone Encounter (Signed)
 Requested medication (s) are due for refill today: yes  Requested medication (s) are on the active medication list: yes  Last refill:  08/14/23 # 4 tab   Future visit scheduled: yes  Notes to clinic: overdue lab  work   Requested Prescriptions  Pending Prescriptions Disp Refills   alendronate  (FOSAMAX ) 70 MG tablet [Pharmacy Med Name: ALENDRONATE  SODIUM 70 MG TAB] 4 tablet 0    Sig: TAKE 1 TABLET BY MOUTH EVERY 7 (SEVEN) DAYS WITH A FULL GLASS OF WATER ON AN EMPTY STOMACH.     Endocrinology:  Bisphosphonates Failed - 09/10/2023  3:06 PM      Failed - Mg Level in normal range and within 360 days    Magnesium   Date Value Ref Range Status  07/23/2011 1.7 (L) mg/dL Final    Comment:    8.1-7.5 THERAPEUTIC RANGE: 4-7 mg/dL TOXIC: > 10 mg/dL  -----------------------          Failed - Phosphate in normal range and within 360 days    Phosphorus  Date Value Ref Range Status  07/24/2020 3.7 3.0 - 4.3 mg/dL Final         Failed - Bone Mineral Density or Dexa Scan completed in the last 2 years      Passed - Ca in normal range and within 360 days    Calcium  Date Value Ref Range Status  08/31/2023 9.7 8.7 - 10.3 mg/dL Final   Calcium, Total  Date Value Ref Range Status  04/12/2013 8.5 8.5 - 10.1 mg/dL Final         Passed - Vitamin D  in normal range and within 360 days    Vit D, 25-Hydroxy  Date Value Ref Range Status  08/31/2023 31.5 30.0 - 100.0 ng/mL Final    Comment:    Vitamin D  deficiency has been defined by the Institute of Medicine and an Endocrine Society practice guideline as a level of serum 25-OH vitamin D  less than 20 ng/mL (1,2). The Endocrine Society went on to further define vitamin D  insufficiency as a level between 21 and 29 ng/mL (2). 1. IOM (Institute of Medicine). 2010. Dietary reference    intakes for calcium and D. [place]: The    [employer]. 2. Holick MF, Binkley [place], Bischoff-Ferrari HA, et al.    Evaluation, treatment, and  prevention of vitamin D     deficiency: an Endocrine Society clinical practice    guideline. JCEM. 2011 Jul; 96(7):1911-30.          Passed - Cr in normal range and within 360 days    Creatinine  Date Value Ref Range Status  04/12/2013 0.86 0.60 - 1.30 mg/dL Final   Creatinine, Ser  Date Value Ref Range Status  08/31/2023 0.61 0.57 - 1.00 mg/dL Final         Passed - eGFR is 30 or above and within 360 days    EGFR (African American)  Date Value Ref Range Status  04/12/2013 >60  Final   GFR calc Af Amer  Date Value Ref Range Status  07/16/2018 >60 >60 mL/min Final   EGFR (Non-African Amer.)  Date Value Ref Range Status  04/12/2013 >60  Final    Comment:    eGFR values <52mL/min/1.73 m2 may be an indication of chronic kidney disease (CKD). Calculated eGFR is useful in patients with stable renal function. The eGFR calculation will not be reliable in acutely ill patients when serum creatinine is changing rapidly. It is not useful in  patients on dialysis. The eGFR calculation may not be applicable to patients at the low and high extremes of body sizes, pregnant women, and vegetarians.    GFR, Estimated  Date Value Ref Range Status  12/15/2021 >60 >60 mL/min Final    Comment:    (NOTE) Calculated using the CKD-EPI Creatinine Equation (2021)    eGFR  Date Value Ref Range Status  08/31/2023 85 >59 mL/min/1.73 Final         Passed - Valid encounter within last 12 months    Recent Outpatient Visits           1 week ago Status post total hip replacement, right   [facility] at [facility], [place]

## 2023-10-03 LAB — COMPREHENSIVE METABOLIC PANEL WITH GFR
ALT: 65 IU/L — ABNORMAL HIGH (ref 0–32)
AST: 60 IU/L — ABNORMAL HIGH (ref 0–40)
Albumin: 3.9 g/dL (ref 3.6–4.6)
Alkaline Phosphatase: 323 IU/L — ABNORMAL HIGH (ref 44–121)
BUN/Creatinine Ratio: 20 (ref 12–28)
BUN: 12 mg/dL (ref 10–36)
Bilirubin Total: 0.9 mg/dL (ref 0.0–1.2)
CO2: 20 mmol/L (ref 20–29)
Calcium: 9.5 mg/dL (ref 8.7–10.3)
Chloride: 95 mmol/L — ABNORMAL LOW (ref 96–106)
Creatinine, Ser: 0.59 mg/dL (ref 0.57–1.00)
Globulin, Total: 3.6 g/dL (ref 1.5–4.5)
Glucose: 99 mg/dL (ref 70–99)
Potassium: 4.7 mmol/L (ref 3.5–5.2)
Sodium: 133 mmol/L — ABNORMAL LOW (ref 134–144)
Total Protein: 7.5 g/dL (ref 6.0–8.5)
eGFR: 86 mL/min/1.73 (ref >59)

## 2023-10-08 ENCOUNTER — Other Ambulatory Visit: Payer: Self-pay | Admitting: Student

## 2023-10-08 DIAGNOSIS — M810 Age-related osteoporosis without current pathological fracture: Secondary | ICD-10-CM

## 2023-10-08 NOTE — Telephone Encounter (Signed)
 Requested medications are due for refill today.  yes  Requested medications are on the active medications list.  yes  Last refill. 09/10/2023 #4 0 rf  Future visit scheduled.   yes  Notes to clinic.  Expired labs    Requested Prescriptions  Pending Prescriptions Disp Refills   alendronate  (FOSAMAX ) 70 MG tablet [Pharmacy Med Name: ALENDRONATE  SODIUM 70 MG TAB] 4 tablet 0    Sig: TAKE 1 TABLET BY MOUTH EVERY 7 (SEVEN) DAYS WITH A FULL GLASS OF WATER ON AN EMPTY STOMACH.     Endocrinology:  Bisphosphonates Failed - 10/08/2023  4:50 PM      Failed - Mg Level in normal range and within 360 days    Magnesium   Date Value Ref Range Status  07/23/2011 1.7 (L) mg/dL Final    Comment:    8.1-7.5 THERAPEUTIC RANGE: 4-7 mg/dL TOXIC: > 10 mg/dL  -----------------------          Failed - Phosphate in normal range and within 360 days    Phosphorus  Date Value Ref Range Status  07/24/2020 3.7 3.0 - 4.3 mg/dL Final         Failed - Bone Mineral Density or Dexa Scan completed in the last 2 years      Passed - Ca in normal range and within 360 days    Calcium  Date Value Ref Range Status  10/02/2023 9.5 8.7 - 10.3 mg/dL Final   Calcium, Total  Date Value Ref Range Status  04/12/2013 8.5 8.5 - 10.1 mg/dL Final         Passed - Vitamin D  in normal range and within 360 days    Vit D, 25-Hydroxy  Date Value Ref Range Status  08/31/2023 31.5 30.0 - 100.0 ng/mL Final    Comment:    Vitamin D  deficiency has been defined by the Institute of Medicine and an Endocrine Society practice guideline as a level of serum 25-OH vitamin D  less than 20 ng/mL (1,2). The Endocrine Society went on to further define vitamin D  insufficiency as a level between 21 and 29 ng/mL (2). 1. IOM (Institute of Medicine). 2010. Dietary reference    intakes for calcium and D. [place]: The    [employer]. 2. Holick MF, Binkley [place], Bischoff-Ferrari HA, et al.    Evaluation, treatment, and  prevention of vitamin D     deficiency: an Endocrine Society clinical practice    guideline. JCEM. 2011 Jul; 96(7):1911-30.          Passed - Cr in normal range and within 360 days    Creatinine  Date Value Ref Range Status  04/12/2013 0.86 0.60 - 1.30 mg/dL Final   Creatinine, Ser  Date Value Ref Range Status  10/02/2023 0.59 0.57 - 1.00 mg/dL Final         Passed - eGFR is 30 or above and within 360 days    EGFR (African American)  Date Value Ref Range Status  04/12/2013 >60  Final   GFR calc Af Amer  Date Value Ref Range Status  07/16/2018 >60 >60 mL/min Final   EGFR (Non-African Amer.)  Date Value Ref Range Status  04/12/2013 >60  Final    Comment:    eGFR values <39mL/min/1.73 m2 may be an indication of chronic kidney disease (CKD). Calculated eGFR is useful in patients with stable renal function. The eGFR calculation will not be reliable in acutely ill patients when serum creatinine is changing rapidly. It is not useful in  patients on dialysis. The eGFR calculation may not be applicable to patients at the low and high extremes of body sizes, pregnant women, and vegetarians.    GFR, Estimated  Date Value Ref Range Status  12/15/2021 >60 >60 mL/min Final    Comment:    (NOTE) Calculated using the CKD-EPI Creatinine Equation (2021)    eGFR  Date Value Ref Range Status  10/02/2023 86 >59 mL/min/1.73 Final         Passed - Valid encounter within last 12 months    Recent Outpatient Visits           1 month ago Status post total hip replacement, right   [facility] at [facility], MD

## 2023-10-09 ENCOUNTER — Other Ambulatory Visit: Payer: Self-pay

## 2023-10-30 ENCOUNTER — Other Ambulatory Visit: Payer: Self-pay

## 2023-10-30 ENCOUNTER — Encounter: Payer: Self-pay | Admitting: Student

## 2023-10-30 DIAGNOSIS — M810 Age-related osteoporosis without current pathological fracture: Secondary | ICD-10-CM

## 2023-10-30 MED ORDER — ALENDRONATE SODIUM 70 MG PO TABS: ORAL_TABLET | ORAL | 1 refills | Status: AC

## 2023-12-07 ENCOUNTER — Telehealth: Payer: Self-pay | Admitting: Student

## 2023-12-07 NOTE — Telephone Encounter (Signed)
 Copied from CRM [id number]. Topic: Medicare AWV >> Dec 07, 2023  1:38 PM Nathanel DEL wrote: Called LVM 12/07/2023 to schedule AWV. Please schedule office or virtual visits  Nathanel Paschal; Care Guide Ambulatory Clinical Support [place] [facility] Direct Dial: [phone]

## 2024-03-04 ENCOUNTER — Encounter: Admitting: Student
